# Patient Record
Sex: Female | Born: 1945 | Race: White | Hispanic: No | Marital: Single | Smoking: Never smoker
Health system: Southern US, Community
[De-identification: ages and names within clinical notes are randomized; demographics above are authoritative.]

## PROBLEM LIST (undated history)

## (undated) DIAGNOSIS — I5020 Unspecified systolic (congestive) heart failure: Secondary | ICD-10-CM

## (undated) DIAGNOSIS — I213 ST elevation (STEMI) myocardial infarction of unspecified site: Secondary | ICD-10-CM

## (undated) DIAGNOSIS — I214 Non-ST elevation (NSTEMI) myocardial infarction: Secondary | ICD-10-CM

## (undated) DIAGNOSIS — C419 Malignant neoplasm of bone and articular cartilage, unspecified: Secondary | ICD-10-CM

## (undated) DIAGNOSIS — E785 Hyperlipidemia, unspecified: Secondary | ICD-10-CM

## (undated) DIAGNOSIS — I2510 Atherosclerotic heart disease of native coronary artery without angina pectoris: Secondary | ICD-10-CM

## (undated) HISTORY — PX: CARDIAC CATHETERIZATION: SHX172

---

## 1971-02-12 HISTORY — PX: HYSTERECTOMY ABDOMINAL WITH SALPINGECTOMY: SHX6725

## 2016-02-12 HISTORY — PX: CATARACT EXTRACTION, BILATERAL: SHX1313

## 2017-09-08 DIAGNOSIS — M51369 Other intervertebral disc degeneration, lumbar region without mention of lumbar back pain or lower extremity pain: Secondary | ICD-10-CM | POA: Insufficient documentation

## 2017-09-08 DIAGNOSIS — M4316 Spondylolisthesis, lumbar region: Secondary | ICD-10-CM | POA: Insufficient documentation

## 2017-09-08 DIAGNOSIS — M5136 Other intervertebral disc degeneration, lumbar region: Secondary | ICD-10-CM | POA: Insufficient documentation

## 2018-07-31 ENCOUNTER — Emergency Department (HOSPITAL_COMMUNITY): Payer: Medicare HMO

## 2018-07-31 ENCOUNTER — Inpatient Hospital Stay (HOSPITAL_COMMUNITY)
Admission: EM | Admit: 2018-07-31 | Discharge: 2018-08-03 | DRG: 247 | Disposition: A | Discharge status: Home / Self Care | Payer: Medicare HMO | Attending: Internal Medicine | Admitting: Internal Medicine

## 2018-07-31 ENCOUNTER — Other Ambulatory Visit: Payer: Self-pay

## 2018-07-31 DIAGNOSIS — Z801 Family history of malignant neoplasm of trachea, bronchus and lung: Secondary | ICD-10-CM | POA: Diagnosis not present

## 2018-07-31 DIAGNOSIS — Z886 Allergy status to analgesic agent status: Secondary | ICD-10-CM | POA: Diagnosis not present

## 2018-07-31 DIAGNOSIS — R079 Chest pain, unspecified: Secondary | ICD-10-CM | POA: Diagnosis not present

## 2018-07-31 DIAGNOSIS — E785 Hyperlipidemia, unspecified: Secondary | ICD-10-CM

## 2018-07-31 DIAGNOSIS — I10 Essential (primary) hypertension: Secondary | ICD-10-CM | POA: Diagnosis present

## 2018-07-31 DIAGNOSIS — I340 Nonrheumatic mitral (valve) insufficiency: Secondary | ICD-10-CM | POA: Diagnosis present

## 2018-07-31 DIAGNOSIS — I2510 Atherosclerotic heart disease of native coronary artery without angina pectoris: Secondary | ICD-10-CM | POA: Diagnosis present

## 2018-07-31 DIAGNOSIS — Z8249 Family history of ischemic heart disease and other diseases of the circulatory system: Secondary | ICD-10-CM

## 2018-07-31 DIAGNOSIS — Z91018 Allergy to other foods: Secondary | ICD-10-CM

## 2018-07-31 DIAGNOSIS — I214 Non-ST elevation (NSTEMI) myocardial infarction: Secondary | ICD-10-CM | POA: Diagnosis present

## 2018-07-31 DIAGNOSIS — Z955 Presence of coronary angioplasty implant and graft: Secondary | ICD-10-CM

## 2018-07-31 DIAGNOSIS — M129 Arthropathy, unspecified: Secondary | ICD-10-CM | POA: Insufficient documentation

## 2018-07-31 HISTORY — DX: Atherosclerotic heart disease of native coronary artery without angina pectoris: I25.10

## 2018-07-31 HISTORY — DX: Hyperlipidemia, unspecified: E78.5

## 2018-07-31 LAB — CBC
HCT: 37.1 % (ref 36.0–46.0)
Hemoglobin: 11.6 g/dL — ABNORMAL LOW (ref 12.0–15.0)
MCH: 31.3 pg (ref 26.0–34.0)
MCHC: 31.3 g/dL (ref 30.0–36.0)
MCV: 100 fL (ref 80.0–100.0)
Platelets: 408 10*3/uL — ABNORMAL HIGH (ref 150–400)
RBC: 3.71 MIL/uL — ABNORMAL LOW (ref 3.87–5.11)
RDW: 13.5 % (ref 11.5–15.5)
WBC: 7.6 10*3/uL (ref 4.0–10.5)
nRBC: 0 % (ref 0.0–0.2)

## 2018-07-31 LAB — BASIC METABOLIC PANEL
Anion gap: 12 (ref 5–15)
BUN: 16 mg/dL (ref 8–23)
CO2: 21 mmol/L — ABNORMAL LOW (ref 22–32)
CREATININE: 0.58 mg/dL (ref 0.44–1.00)
Calcium: 9.2 mg/dL (ref 8.9–10.3)
Chloride: 105 mmol/L (ref 98–111)
GFR calc Af Amer: >60 mL/min (ref >60)
GFR calc non Af Amer: >60 mL/min (ref >60)
Glucose, Bld: 110 mg/dL — ABNORMAL HIGH (ref 70–99)
Potassium: 3.7 mmol/L (ref 3.5–5.1)
Sodium: 138 mmol/L (ref 135–145)

## 2018-07-31 LAB — I-STAT TROPONIN, ED: Troponin i, poc: 0.18 ng/mL (ref 0.00–0.08)

## 2018-07-31 LAB — HEPATIC FUNCTION PANEL
ALT: 19 U/L (ref 0–44)
AST: 24 U/L (ref 15–41)
Albumin: 4.1 g/dL (ref 3.5–5.0)
Alkaline Phosphatase: 54 U/L (ref 38–126)
Bilirubin, Direct: <0.1 mg/dL (ref 0.0–0.2)
Total Bilirubin: 0.6 mg/dL (ref 0.3–1.2)
Total Protein: 7.3 g/dL (ref 6.5–8.1)

## 2018-07-31 LAB — LIPID PANEL
CHOL/HDL RATIO: 5.4 ratio
Cholesterol: 257 mg/dL — ABNORMAL HIGH (ref 0–200)
HDL: 48 mg/dL (ref >40)
LDL Cholesterol: 194 mg/dL — ABNORMAL HIGH (ref 0–99)
Triglycerides: 75 mg/dL (ref <150)
VLDL: 15 mg/dL (ref 0–40)

## 2018-07-31 LAB — TSH: TSH: 1.824 u[IU]/mL (ref 0.350–4.500)

## 2018-07-31 LAB — LIPASE, BLOOD: Lipase: 25 U/L (ref 11–51)

## 2018-07-31 LAB — HEMOGLOBIN A1C
Hgb A1c MFr Bld: 5.7 % — ABNORMAL HIGH (ref 4.8–5.6)
Mean Plasma Glucose: 116.89 mg/dL

## 2018-07-31 LAB — TROPONIN I: Troponin I: 0.14 ng/mL (ref <0.03)

## 2018-07-31 MED ORDER — ACETAMINOPHEN 325 MG PO TABS
650.0000 mg | ORAL_TABLET | Freq: Four times a day (QID) | ORAL | Status: DC | PRN
  Administered 2018-08-02 – 2018-08-03 (×3): 650 mg via ORAL
  Filled 2018-07-31 (×3): qty 2

## 2018-07-31 MED ORDER — NITROGLYCERIN 0.4 MG SL SUBL: 0.4000 mg | SUBLINGUAL_TABLET | SUBLINGUAL | Status: DC | PRN

## 2018-07-31 MED ORDER — ASPIRIN EC 81 MG PO TBEC
81.0000 mg | DELAYED_RELEASE_TABLET | Freq: Every day | ORAL | Status: DC
  Administered 2018-08-01 – 2018-08-03 (×3): 81 mg via ORAL
  Filled 2018-07-31 (×3): qty 1

## 2018-07-31 MED ORDER — ONDANSETRON HCL 4 MG/2ML IJ SOLN: 4.0000 mg | Freq: Four times a day (QID) | INTRAMUSCULAR | Status: DC | PRN

## 2018-07-31 MED ORDER — HEPARIN BOLUS VIA INFUSION
4000.0000 [IU] | Freq: Once | INTRAVENOUS | Status: AC
  Administered 2018-07-31: 4000 [IU] via INTRAVENOUS
  Filled 2018-07-31: qty 4000

## 2018-07-31 MED ORDER — METOPROLOL TARTRATE 12.5 MG HALF TABLET
12.5000 mg | ORAL_TABLET | Freq: Two times a day (BID) | ORAL | Status: DC
  Administered 2018-07-31 – 2018-08-02 (×4): 12.5 mg via ORAL
  Filled 2018-07-31 (×4): qty 1

## 2018-07-31 MED ORDER — CLOPIDOGREL BISULFATE 75 MG PO TABS
600.0000 mg | ORAL_TABLET | Freq: Once | ORAL | Status: AC
  Administered 2018-07-31: 600 mg via ORAL
  Filled 2018-07-31: qty 8

## 2018-07-31 MED ORDER — HEPARIN (PORCINE) 25000 UT/250ML-% IV SOLN
1000.0000 [IU]/h | INTRAVENOUS | Status: DC
  Administered 2018-07-31: 750 [IU]/h via INTRAVENOUS
  Administered 2018-08-01: 1000 [IU]/h via INTRAVENOUS
  Filled 2018-07-31 (×2): qty 250

## 2018-07-31 MED ORDER — ATORVASTATIN CALCIUM 80 MG PO TABS
80.0000 mg | ORAL_TABLET | Freq: Every day | ORAL | Status: DC
  Administered 2018-07-31 – 2018-08-02 (×3): 80 mg via ORAL
  Filled 2018-07-31 (×3): qty 1

## 2018-07-31 MED ORDER — SODIUM CHLORIDE 0.9% FLUSH
3.0000 mL | Freq: Once | INTRAVENOUS | Status: AC
  Administered 2018-08-02: 16:00:00 3 mL via INTRAVENOUS

## 2018-07-31 MED ORDER — CLOPIDOGREL BISULFATE 75 MG PO TABS
75.0000 mg | ORAL_TABLET | Freq: Every day | ORAL | Status: DC
  Administered 2018-08-01 – 2018-08-03 (×3): 75 mg via ORAL
  Filled 2018-07-31 (×3): qty 1

## 2018-07-31 NOTE — H&P (Signed)
Cardiology Admission History and Physical:   Patient ID: Lynn Hogan MRN: [id number]; DOB: 1945/08/16   Admission date: 07/31/2018  Primary Care Provider: Si Gaul, DO Primary Cardiologist: None Primary Electrophysiologist:  None   Chief Complaint:  Chest pain   Patient Profile:   Lynn Hogan is a 73 y.o. female with no medical history who presents with chest pain and elevated troponin consistent with NSTEMI.  History of Present Illness:   Lynn Hogan is a previously healthy woman who presents with chest pain. She was in her usual state of health until yesterday afternoon when she developed a burning sensation in her chest when she was resting at home. This sensation occurred after eating so she thought that it was reflux (although she does not have a history of reflux). Today, she got to work and had a recurrence of the burning sensation in her chest. The burning sensation was located in the center of her chest and radiated up to her jaw. She felt nauseous, short of breath and sweaty. She tried to walk to help relieve the pain and slowly it subsided. Unfortunately, the pain returned so she left work and was seen by her PCP. While at her PCP office, she had another episode of pain and was sent to the ER for evaluation. While in the ER, her troponins were found to be positive. She denies any current symptoms. She has three dogs at home that she walks regularly and has never had any chest pain or shortness of breath with walking. She did not see a doctor for quite some time but recently started seeing a PCP. She was found to have high cholesterol (reports an LDL around 170) but she has been trying to avoid foods high in cholesterol and was, reportedly, able to reduce her LDL by 40 points. However, Lynn Hogan and her sisters note that she may have had high cholesterol for quite some time. She denies any current chest pain, shortness of breath, palpitations, lightheadedness, dizziness, lower  leg swelling, orthopnea, PND, fevers, chills or dysuria.     No past medical history on file.  Medications Prior to Admission: Prior to Admission medications   Medication Sig Start Date End Date Taking? Authorizing Provider  acetaminophen (TYLENOL) 500 MG tablet Take 500-1,000 mg by mouth every 6 (six) hours as needed (pain or headaches).    Yes [provider]  Glucosamine-Chondroitin (GLUCOSAMINE CHONDR COMPLEX PO) Take 1 tablet by mouth 2 (two) times daily with a meal.    Yes [provider]     Allergies:    Allergies  Allergen Reactions  . Ibuprofen Palpitations  . Nsaids Palpitations    Patient stated she has not tried all NSAIDS to see if they give her palpitations, but she is not willing to try anything else- just in case  . Banana Nausea Only    Social History:   She lives alone with her parrot and three dogs. She has several sisters that are close by. 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 She denies any history of alcohol, drug or tobacco use.   Family History:  Father- MI at age 49 Mother- Lung cancer (smoker) Sister- Atrial fibrillation   ROS:  Please see the history of present illness.  All other ROS reviewed and negative.     Physical Exam/Data:   Vitals:   07/31/18 1930 07/31/18 2000 07/31/18 2001 07/31/18 2030  BP: (!) 160/89 (!) 171/79  (!) 171/86  Pulse: 87  86 91  Resp: 18  19 14   SpO2: 96%  96% 96%  Weight:      Height:       No intake or output data in the 24 hours ending 07/31/18 2224 Last 3 Weights 07/31/2018  Weight (lbs) 161 lb  Weight (kg) 73.029 kg     Body mass index is 30.42 kg/m.  General:  Well nourished, well developed, in no acute distress HEENT: normal Lymph: no adenopathy Neck: no JVD Endocrine:  No thryomegaly Vascular: No carotid bruits; FA pulses 2+ bilaterally without bruits  Cardiac:  normal S1, S2; RRR; no murmur  Lungs:  clear to auscultation bilaterally, no wheezing,  rhonchi or rales  Abd: soft, nontender, no hepatomegaly  Ext: no edema Musculoskeletal:  No deformities, BUE and BLE strength normal and equal Skin: warm and dry  Neuro:  CNs 2-12 intact, no focal abnormalities noted Psych:  Normal affect    EKG:  The ECG that was done was personally reviewed and demonstrates sinus rhythm with nonspecific ST Segment changes (mild ST depression in some leads)  Relevant CV Studies: None  Laboratory Data:  Chemistry Recent Labs  Lab 07/31/18 1815  NA 138  K 3.7  CL 105  CO2 21*  GLUCOSE 110*  BUN 16  CREATININE 0.58  CALCIUM 9.2  GFRNONAA >60  GFRAA >60  ANIONGAP 12    Recent Labs  Lab 07/31/18 1815  PROT 7.3  ALBUMIN 4.1  AST 24  ALT 19  ALKPHOS 54  BILITOT 0.6   Hematology Recent Labs  Lab 07/31/18 1815  WBC 7.6  RBC 3.71*  HGB 11.6*  HCT 37.1  MCV 100.0  MCH 31.3  MCHC 31.3  RDW 13.5  PLT 408*   Cardiac Enzymes Recent Labs  Lab 07/31/18 1815  TROPONINI 0.14*    Recent Labs  Lab 07/31/18 1824  TROPIPOC 0.18*    BNPNo results for input(s): BNP, PROBNP in the last 168 hours.  DDimer No results for input(s): DDIMER in the last 168 hours.  Radiology/Studies:  Dg Chest 2 View  Result Date: 07/31/2018 CLINICAL DATA:  Acute chest pain today. EXAM: CHEST - 2 VIEW COMPARISON:  08/21/2017 lumbar spine FINDINGS: UPPER limits normal heart size noted. There is no evidence of focal airspace disease, pulmonary edema, suspicious pulmonary nodule/mass, pleural effusion, or pneumothorax. No acute bony abnormalities are identified. Sclerosis of a LOWER thoracic vertebral body is nonspecific (question T10). IMPRESSION: No evidence of acute cardiopulmonary disease. Nonspecific sclerosis of a LOWER thoracic vertebral body. Elective bone scan recommended for further evaluation. Electronically Signed   By: Harmon PierJeffrey  Hu M.D.   On: 07/31/2018 20:23    Assessment and Plan:   Lynn Hogan is a previously healthy 72y/o woman who presents  with an NSTEMI.  1. NSTEMI- She presents with classic symptoms of chest pain, nausea, and diaphoresis with elevated troponin consistent with an NSTEMI. Unfortunately, she has very elevated cholesterol that has probably been elevated for quite some time. She also has a family history of heart disease in her father. She would benefit from cardiac catheterization.  - Start heparin ACS nomogram - S/p aspirin 324mg   - Continue aspirin 81mg  daily - Load with plavix 600mg  then 75mg  daily - Start lipitor 80mg  - Start metoprolol 12.5mg  BID - Monitor on tele - Echo ordered - Plan for catheterization on Monday or sooner if chest pain returns - Nitro PRN for chest pain - Daily ECG   2. Hyperlipidemia- She has  been working on her diet to control her cholesterol but, unfortunately, continues to have significantly elevated LDL cholesterol. - Lipitor 80mg  as above  FEN/GI - Regular diet - DVT PPx: Heparin drip - GI PPx: Not indicated   Code status: Full code, confirmed on admission. She would want her sister (the one that is a PA) to make medical decisions for her in the event that she were unable to do so herself.   Severity of Illness: The appropriate patient status for this patient is INPATIENT. Inpatient status is judged to be reasonable and necessary in order to provide the required intensity of service to ensure the patient's safety. The patient's presenting symptoms, physical exam findings, and initial radiographic and laboratory data in the context of their chronic comorbidities is felt to place them at high risk for further clinical deterioration. Furthermore, it is not anticipated that the patient will be medically stable for discharge from the hospital within 2 midnights of admission. The following factors support the patient status of inpatient.   " The patient's presenting symptoms include chest pain, shortness of breath and nausea. " The initial radiographic and laboratory data are  worrisome because of elevated troponin. " The chronic co-morbidities include hyperlipidemia.   * I certify that at the point of admission it is my clinical judgment that the patient will require inpatient hospital care spanning beyond 2 midnights from the point of admission due to high intensity of service, high risk for further deterioration and high frequency of surveillance required.*    For questions or updates, please contact [facility] Please consult [email or web address] for contact info under     Signed, Nicoletta Ba, MD  07/31/2018 10:24 PM

## 2018-07-31 NOTE — Progress Notes (Signed)
Pt received from ED for chest paint patient currently asymptomatic. Initial assessment, vitals, CHG complete. Tele applied CCMD notified. Pt oriented to the room and call bell system. Will continue to monitor.

## 2018-07-31 NOTE — Progress Notes (Signed)
ANTICOAGULATION CONSULT NOTE - Initial Consult  Pharmacy Consult for heparin Indication: chest pain/ACS  Allergies not on file  Patient Measurements: Height: 5\' 1"  (154.9 cm) Weight: 161 lb (73 kg) IBW/kg (Calculated) : 47.8  Vital Signs: BP: 166/86 (02/01 1915) Pulse Rate: 89 (02/01 1915)  Labs: Recent Labs    07/31/18 1815  HGB 11.6*  HCT 37.1  PLT 408*  CREATININE 0.58  TROPONINI 0.14*    Estimated Creatinine Clearance: 57.2 mL/min (by C-G formula based on SCr of 0.58 mg/dL).  Assessment: CC/HPI: 73 yo f presenting with chest burning  Anticoag: none pta iv hep for r/o acs  Renal: SCr 0.58  Heme/Onc: H&H 11.6/37.1, Plt 408  Goal of Therapy:  Heparin level 0.3-0.7 units/ml Monitor platelets by anticoagulation protocol: Yes   Plan:  Heparin bolus 4000 units x 1 Heparin gtt 750 units/hr Daily HL CBC  Isaac Bliss, PharmD, BCPS, BCCCP Clinical Pharmacist [phone]  Please check AMION for all [facility] numbers  07/31/2018 7:59 PM

## 2018-07-31 NOTE — ED Triage Notes (Signed)
Pt arrived via [facility] from work where she experienced an episode of her chest burning. Episode quickly resolved but was seen at [facility] anyway. Pain returned while at [facility]. EMS notified for transport to ED. Pt is currently pain free. No hx. Pt is ambulatory and alert/oriented x4. 324mg  ASA and 1 SL nitro given by EMS PTA.

## 2018-07-31 NOTE — ED Provider Notes (Signed)
Emergency Department Provider Note   I have reviewed the triage vital signs and the nursing notes.   HISTORY  Chief Complaint Chest Pain   HPI Lynn Hogan is a 73 y.o. female without significant past medical history the presents the emergency department today with multiple episodes of epigastric burning that radiated to both sides of her neck into her ears.  They came and went.  One time it was associated with nausea and another time was associated with diaphoresis both went away without intervention.  Went to urgent care reviewed EKG it was normal and called EMS and sent her here.  EMS EKGs were normal.  She was given 324 of aspirin prior to arrival here.  She was given 1 dose of nitroglycerin but the pain improved prior to the nitroglycerin.  She is a family history of coronary disease and a father who died at a young age of a massive heart attack.  She is had some borderline blood pressures and cholesterol in the past but not on treatment for either 1.  She has some recent weight loss but still mildly obese. No other associated or modifying symptoms.    No past medical history on file.  Patient Active Problem List   Diagnosis Date Noted  . NSTEMI (non-ST elevated myocardial infarction) (HCC) 07/31/2018  . Hyperlipidemia 07/31/2018    Allergies Ibuprofen; Nsaids; and Banana  No family history on file.  Social History Social History   Tobacco Use  . Smoking status: Not on file  Substance Use Topics  . Alcohol use: Not on file  . Drug use: Not on file    Review of Systems  All other systems negative except as documented in the HPI. All pertinent positives and negatives as reviewed in the HPI. ____________________________________________   PHYSICAL EXAM:  VITAL SIGNS: ED Triage Vitals  Enc Vitals Group     BP 07/31/18 1830 (!) 154/89     Pulse Rate 07/31/18 1830 88     Resp 07/31/18 1830 20     Temp 07/31/18 2300 98.9 F (37.2 C)     Temp Source 07/31/18  2300 Oral     SpO2 07/31/18 1830 95 %     Weight 07/31/18 1917 161 lb (73 kg)     Height 07/31/18 1917 5\' 1"  (1.549 m)    Constitutional: Alert and oriented. Well appearing and in no acute distress. Eyes: Conjunctivae are normal. PERRL. EOMI. Head: Atraumatic. Nose: No congestion/rhinnorhea. Mouth/Throat: Mucous membranes are moist.  Oropharynx non-erythematous. Neck: No stridor.  No meningeal signs.   Cardiovascular: Normal rate, regular rhythm. Good peripheral circulation. Grossly normal heart sounds.   Respiratory: Normal respiratory effort.  No retractions. Lungs CTAB. Gastrointestinal: Soft and nontender. No distention.  Musculoskeletal: No lower extremity tenderness nor edema. No gross deformities of extremities. Neurologic:  Normal speech and language. No gross focal neurologic deficits are appreciated.  Skin:  Skin is warm, dry and intact. No rash noted.   ____________________________________________   LABS (all labs ordered are listed, but only abnormal results are displayed)  Labs Reviewed  BASIC METABOLIC PANEL - Abnormal; Notable for the following components:      Result Value   CO2 21 (*)    Glucose, Bld 110 (*)    All other components within normal limits  CBC - Abnormal; Notable for the following components:   RBC 3.71 (*)    Hemoglobin 11.6 (*)    Platelets 408 (*)    All other components within normal  limits  TROPONIN I - Abnormal; Notable for the following components:   Troponin I 0.14 (*)    All other components within normal limits  LIPID PANEL - Abnormal; Notable for the following components:   Cholesterol 257 (*)    LDL Cholesterol 194 (*)    All other components within normal limits  HEMOGLOBIN A1C - Abnormal; Notable for the following components:   Hgb A1c MFr Bld 5.7 (*)    All other components within normal limits  I-STAT TROPONIN, ED - Abnormal; Notable for the following components:   Troponin i, poc 0.18 (*)    All other components within  normal limits  HEPATIC FUNCTION PANEL  LIPASE, BLOOD  TSH  HEPARIN LEVEL (UNFRACTIONATED)  CBC  TROPONIN I  TROPONIN I  TROPONIN I  BASIC METABOLIC PANEL   ____________________________________________  EKG   EKG Interpretation  Date/Time:  Saturday July 31 2018 18:11:16 EST Ventricular Rate:  88 PR Interval:    QRS Duration: 90 QT Interval:  386 QTC Calculation: 467 R Axis:   -28 Text Interpretation:  Sinus rhythm Probable left atrial enlargement Borderline left axis deviation No old tracing to compare Confirmed by Marily MemosMesner, Hyun Marsalis [phone]) on 07/31/2018 6:26:05 PM       ____________________________________________  RADIOLOGY  Dg Chest 2 View  Result Date: 07/31/2018 CLINICAL DATA:  Acute chest pain today. EXAM: CHEST - 2 VIEW COMPARISON:  08/21/2017 lumbar spine FINDINGS: UPPER limits normal heart size noted. There is no evidence of focal airspace disease, pulmonary edema, suspicious pulmonary nodule/mass, pleural effusion, or pneumothorax. No acute bony abnormalities are identified. Sclerosis of a LOWER thoracic vertebral body is nonspecific (question T10). IMPRESSION: No evidence of acute cardiopulmonary disease. Nonspecific sclerosis of a LOWER thoracic vertebral body. Elective bone scan recommended for further evaluation. Electronically Signed   By: Harmon PierJeffrey  Hu M.D.   On: 07/31/2018 20:23    ____________________________________________   PROCEDURES  Procedure(s) performed:   Procedures  CRITICAL CARE Performed by: Marily MemosJason Alisabeth Selkirk Total critical care time: 35 minutes Critical care time was exclusive of separately billable procedures and treating other patients. Critical care was necessary to treat or prevent imminent or life-threatening deterioration. Critical care was time spent personally by me on the following activities: development of treatment plan with patient and/or surrogate as well as nursing, discussions with consultants, evaluation of patient's response  to treatment, examination of patient, obtaining history from patient or surrogate, ordering and performing treatments and interventions, ordering and review of laboratory studies, ordering and review of radiographic studies, pulse oximetry and re-evaluation of patient's condition.  ____________________________________________   INITIAL IMPRESSION / ASSESSMENT AND PLAN / ED COURSE  Very atypical story but has undetectable troponin.  Discussed with cardiology who will admit.  Heparin started in the emergency room.     Pertinent labs & imaging results that were available during my care of the patient were reviewed by me and considered in my medical decision making (see chart for details).  ____________________________________________  FINAL CLINICAL IMPRESSION(S) / ED DIAGNOSES  Final diagnoses:  NSTEMI (non-ST elevated myocardial infarction) (HCC)     MEDICATIONS GIVEN DURING THIS VISIT:  Medications  sodium chloride flush (NS) 0.9 % injection 3 mL (has no administration in time range)  heparin ADULT infusion 100 units/mL (25000 units/25950mL sodium chloride 0.45%) (750 Units/hr Intravenous New Bag/Given 07/31/18 2014)  acetaminophen (TYLENOL) tablet 650 mg (has no administration in time range)  aspirin EC tablet 81 mg (has no administration in time range)  nitroGLYCERIN (NITROSTAT) SL  tablet 0.4 mg (has no administration in time range)  ondansetron (ZOFRAN) injection 4 mg (has no administration in time range)  clopidogrel (PLAVIX) tablet 600 mg (has no administration in time range)  clopidogrel (PLAVIX) tablet 75 mg (has no administration in time range)  metoprolol tartrate (LOPRESSOR) tablet 12.5 mg (has no administration in time range)  atorvastatin (LIPITOR) tablet 80 mg (has no administration in time range)  heparin bolus via infusion 4,000 Units (4,000 Units Intravenous Bolus from Bag 07/31/18 2014)     NEW OUTPATIENT MEDICATIONS STARTED DURING THIS VISIT:  Current Discharge  Medication List      Note:  This note was prepared with assistance of Dragon voice recognition software. Occasional wrong-word or sound-a-like substitutions may have occurred due to the inherent limitations of voice recognition software.   Marily Memos, MD 07/31/18 2328

## 2018-07-31 NOTE — ED Notes (Signed)
Pt ambulated to the bathroom.  

## 2018-08-01 ENCOUNTER — Encounter (HOSPITAL_COMMUNITY): Payer: Self-pay

## 2018-08-01 ENCOUNTER — Inpatient Hospital Stay (HOSPITAL_COMMUNITY): Payer: Medicare HMO

## 2018-08-01 ENCOUNTER — Other Ambulatory Visit (HOSPITAL_COMMUNITY): Payer: Medicare HMO

## 2018-08-01 DIAGNOSIS — I214 Non-ST elevation (NSTEMI) myocardial infarction: Principal | ICD-10-CM

## 2018-08-01 DIAGNOSIS — R079 Chest pain, unspecified: Secondary | ICD-10-CM

## 2018-08-01 LAB — BASIC METABOLIC PANEL
ANION GAP: 13 (ref 5–15)
BUN: 10 mg/dL (ref 8–23)
CO2: 21 mmol/L — ABNORMAL LOW (ref 22–32)
Calcium: 9.5 mg/dL (ref 8.9–10.3)
Chloride: 106 mmol/L (ref 98–111)
Creatinine, Ser: 0.55 mg/dL (ref 0.44–1.00)
GFR calc Af Amer: >60 mL/min (ref >60)
GFR calc non Af Amer: >60 mL/min (ref >60)
Glucose, Bld: 108 mg/dL — ABNORMAL HIGH (ref 70–99)
POTASSIUM: 3.6 mmol/L (ref 3.5–5.1)
Sodium: 140 mmol/L (ref 135–145)

## 2018-08-01 LAB — CBC
HCT: 36.3 % (ref 36.0–46.0)
Hemoglobin: 11.9 g/dL — ABNORMAL LOW (ref 12.0–15.0)
MCH: 32.1 pg (ref 26.0–34.0)
MCHC: 32.8 g/dL (ref 30.0–36.0)
MCV: 97.8 fL (ref 80.0–100.0)
Platelets: 415 10*3/uL — ABNORMAL HIGH (ref 150–400)
RBC: 3.71 MIL/uL — ABNORMAL LOW (ref 3.87–5.11)
RDW: 13.7 % (ref 11.5–15.5)
WBC: 8.1 10*3/uL (ref 4.0–10.5)
nRBC: 0 % (ref 0.0–0.2)

## 2018-08-01 LAB — TROPONIN I
Troponin I: 0.51 ng/mL (ref <0.03)
Troponin I: 0.67 ng/mL (ref <0.03)
Troponin I: 0.66 ng/mL (ref <0.03)

## 2018-08-01 LAB — MRSA PCR SCREENING: MRSA by PCR: NEGATIVE

## 2018-08-01 LAB — HEPARIN LEVEL (UNFRACTIONATED)
Heparin Unfractionated: 0.22 IU/mL — ABNORMAL LOW (ref 0.30–0.70)
Heparin Unfractionated: 0.31 IU/mL (ref 0.30–0.70)

## 2018-08-01 MED ORDER — SODIUM CHLORIDE 0.9 % WEIGHT BASED INFUSION: 1.0000 mL/kg/h | INTRAVENOUS | Status: DC

## 2018-08-01 MED ORDER — SODIUM CHLORIDE 0.9% FLUSH
3.0000 mL | Freq: Two times a day (BID) | INTRAVENOUS | Status: DC
  Administered 2018-08-01: 3 mL via INTRAVENOUS

## 2018-08-01 MED ORDER — SODIUM CHLORIDE 0.9 % WEIGHT BASED INFUSION
3.0000 mL/kg/h | INTRAVENOUS | Status: DC
  Administered 2018-08-02: 3 mL/kg/h via INTRAVENOUS

## 2018-08-01 MED ORDER — SODIUM CHLORIDE 0.9 % IV SOLN: 250.0000 mL | INTRAVENOUS | Status: DC | PRN

## 2018-08-01 MED ORDER — SODIUM CHLORIDE 0.9% FLUSH: 3.0000 mL | INTRAVENOUS | Status: DC | PRN

## 2018-08-01 NOTE — Progress Notes (Signed)
Progress Note  Patient Name: Lynn MoritaLinda Hogan Date of Encounter: 08/01/2018  Primary Cardiologist: No primary care provider on file.   Subjective   Currently feeling well without complaint.  No further episodes of chest pain.  Inpatient Medications    Scheduled Meds: . aspirin EC  81 mg Oral Daily  . atorvastatin  80 mg Oral q1800  . clopidogrel  75 mg Oral Q breakfast  . metoprolol tartrate  12.5 mg Oral BID  . sodium chloride flush  3 mL Intravenous Once   Continuous Infusions: . heparin 900 Units/hr (08/01/18 0617)   PRN Meds: acetaminophen, nitroGLYCERIN, ondansetron (ZOFRAN) IV   Vital Signs    Vitals:   07/31/18 2030 07/31/18 2230 07/31/18 2300 08/01/18 0837  BP: (!) 171/86 (!) 158/80 (!) 168/75 (!) 162/73  Pulse: 91 86 86 76  Resp: 14 12 12 18   Temp:   98.9 F (37.2 C) 98.4 F (36.9 C)  TempSrc:   Oral Oral  SpO2: 96% 94% 97% 98%  Weight:   73.6 kg   Height:   5\' 1"  (1.549 m)     Intake/Output Summary (Last 24 hours) at 08/01/2018 1219 Last data filed at 08/01/2018 0427 Gross per 24 hour  Intake 101.19 ml  Output -  Net 101.19 ml   Last 3 Weights 07/31/2018 07/31/2018  Weight (lbs) 162 lb 3.2 oz 161 lb  Weight (kg) 73.573 kg 73.029 kg      Telemetry    Sinus rhythm- Personally Reviewed  ECG    Sinus rhythm- Personally Reviewed  Physical Exam   GEN: No acute distress.   Neck: No JVD Cardiac: RRR, no murmurs, rubs, or gallops.  Respiratory: Clear to auscultation bilaterally. GI: Soft, nontender, non-distended  MS: No edema; No deformity. Neuro:  Nonfocal  Psych: Normal affect   Labs    Chemistry Recent Labs  Lab 07/31/18 1815 08/01/18 0427  NA 138 140  K 3.7 3.6  CL 105 106  CO2 21* 21*  GLUCOSE 110* 108*  BUN 16 10  CREATININE 0.58 0.55  CALCIUM 9.2 9.5  PROT 7.3  --   ALBUMIN 4.1  --   AST 24  --   ALT 19  --   ALKPHOS 54  --   BILITOT 0.6  --   GFRNONAA >60 >60  GFRAA >60 >60  ANIONGAP 12 13     Hematology Recent Labs    Lab 07/31/18 1815 08/01/18 0427  WBC 7.6 8.1  RBC 3.71* 3.71*  HGB 11.6* 11.9*  HCT 37.1 36.3  MCV 100.0 97.8  MCH 31.3 32.1  MCHC 31.3 32.8  RDW 13.5 13.7  PLT 408* 415*    Cardiac Enzymes Recent Labs  Lab 07/31/18 1815 07/31/18 2305 08/01/18 0427 08/01/18 1044  TROPONINI 0.14* 0.51* 0.67* 0.66*    Recent Labs  Lab 07/31/18 1824  TROPIPOC 0.18*     BNPNo results for input(s): BNP, PROBNP in the last 168 hours.   DDimer No results for input(s): DDIMER in the last 168 hours.   Radiology    Dg Chest 2 View  Result Date: 07/31/2018 CLINICAL DATA:  Acute chest pain today. EXAM: CHEST - 2 VIEW COMPARISON:  08/21/2017 lumbar spine FINDINGS: UPPER limits normal heart size noted. There is no evidence of focal airspace disease, pulmonary edema, suspicious pulmonary nodule/mass, pleural effusion, or pneumothorax. No acute bony abnormalities are identified. Sclerosis of a LOWER thoracic vertebral body is nonspecific (question T10). IMPRESSION: No evidence of acute cardiopulmonary disease. Nonspecific sclerosis  of a LOWER thoracic vertebral body. Elective bone scan recommended for further evaluation. Electronically Signed   By: Harmon Pier M.D.   On: 07/31/2018 20:23    Cardiac Studies   Echo pending  Patient Profile     73 y.o. female who presented to the hospital with episodic chest pain, found to have an elevated troponin and non-STEMI.  Assessment & Plan    1.  Non-STEMI: Currently on heparin, aspirin, Plavix, Lipitor, and low-dose metoprolol.  She is currently chest pain-free.  Troponin has trended down.  We [name] thus plan for left heart catheterization tomorrow.  The patient understands that risks include but are not limited to stroke (1 in 1000), death (1 in 1000), kidney failure [usually temporary] (1 in 500), bleeding (1 in 200), allergic reaction [possibly serious] (1 in 200), and agrees to proceed.  2.  Hyperlipidemia: Has been placed on Lipitor 80 mg.  VL quite  elevated at 194.      For questions or updates, please contact [facility] Please consult [email or web address] for contact info under        Signed, Maitlyn Penza Jorja Loa, MD  08/01/2018, 12:19 PM

## 2018-08-01 NOTE — Progress Notes (Signed)
  Echocardiogram 2D Echocardiogram has been performed.  Lynn Hogan F 08/01/2018, 3:19 PM

## 2018-08-01 NOTE — Progress Notes (Signed)
ANTICOAGULATION CONSULT NOTE   Pharmacy Consult for Heparin  Indication: chest pain/ACS  Allergies  Allergen Reactions  . Ibuprofen Palpitations  . Nsaids Palpitations    Patient stated she has not tried all NSAIDS to see if they give her palpitations, but she is not willing to try anything else- just in case  . Banana Nausea Only    Patient Measurements: Height: 5\' 1"  (154.9 cm) Weight: 162 lb 3.2 oz (73.6 kg) IBW/kg (Calculated) : 47.8  Vital Signs: Temp: 98.2 F (36.8 C) (02/02 1230) Temp Source: Oral (02/02 1230) BP: 145/87 (02/02 1230) Pulse Rate: 74 (02/02 1230)  Labs: Recent Labs    07/31/18 1815 07/31/18 2305 08/01/18 0427 08/01/18 1044 08/01/18 1351  HGB 11.6*  --  11.9*  --   --   HCT 37.1  --  36.3  --   --   PLT 408*  --  415*  --   --   HEPARINUNFRC  --   --  0.22*  --  0.31  CREATININE 0.58  --  0.55  --   --   TROPONINI 0.14* 0.51* 0.67* 0.66*  --     Estimated Creatinine Clearance: 57.4 mL/min (by C-G formula based on SCr of 0.55 mg/dL).   Medical History: No past medical history on file.   Assessment: Heparin for NSTEMI, trop 0.51>0.67>0.66. No anticoag PTA.  Confirmatory heparin level came back therapeutic at 0.31, on 900 units/hr. Hgb 11.9, plt 415. No s/sx of bleeding. No infusion issues.  Goal of Therapy:  Heparin level 0.3-0.7 units/ml Monitor platelets by anticoagulation protocol: Yes   Plan:  Increase heparin to 1000 units/hr to keep in goal range Monitor daily HL, CBC, and for s/sx of bleeding  Sherron Monday, PharmD, BCCCP Clinical Pharmacist  Pager: [phone] Phone: [phone] 08/01/2018,2:52 PM

## 2018-08-01 NOTE — Progress Notes (Addendum)
CRITICAL VALUE ALERT  Critical Value:  Troponin 0.51 now 0.67  Date & Time Notied:  08/01/2018 0550 when reviewing morning labs   Provider Notified: Conigilio   Orders Received/Actions taken: Awaiting orders. Pt asymptomatic. Will continue to monitor.

## 2018-08-01 NOTE — Progress Notes (Signed)
ANTICOAGULATION CONSULT NOTE   Pharmacy Consult for Heparin  Indication: chest pain/ACS  Allergies  Allergen Reactions  . Ibuprofen Palpitations  . Nsaids Palpitations    Patient stated she has not tried all NSAIDS to see if they give her palpitations, but she is not willing to try anything else- just in case  . Banana Nausea Only    Patient Measurements: Height: 5\' 1"  (154.9 cm) Weight: 162 lb 3.2 oz (73.6 kg) IBW/kg (Calculated) : 47.8  Vital Signs: Temp: 98.9 F (37.2 C) (02/01 2300) Temp Source: Oral (02/01 2300) BP: 168/75 (02/01 2300) Pulse Rate: 86 (02/01 2300)  Labs: Recent Labs    07/31/18 1815 07/31/18 2305 08/01/18 0427  HGB 11.6*  --  11.9*  HCT 37.1  --  36.3  PLT 408*  --  415*  HEPARINUNFRC  --   --  0.22*  CREATININE 0.58  --  0.55  TROPONINI 0.14* 0.51* 0.67*    Estimated Creatinine Clearance: 57.4 mL/min (by C-G formula based on SCr of 0.55 mg/dL).   Medical History: No past medical history on file.   Assessment: Heparin for NSTEMI, trop 0.51>0.67, CBC stable, initial heparin level is low at 0.22  Goal of Therapy:  Heparin level 0.3-0.7 units/ml Monitor platelets by anticoagulation protocol: Yes   Plan:  Inc heparin to 900 units/hr Re-check heparin level in 8 hours  Abran Duke 08/01/2018,6:13 AM

## 2018-08-02 ENCOUNTER — Encounter (HOSPITAL_COMMUNITY)
Admission: EM | Disposition: A | Discharge status: Home / Self Care | Payer: Self-pay | Source: Home / Self Care | Attending: Internal Medicine

## 2018-08-02 ENCOUNTER — Encounter (HOSPITAL_COMMUNITY): Payer: Self-pay | Admitting: Cardiology

## 2018-08-02 DIAGNOSIS — I2510 Atherosclerotic heart disease of native coronary artery without angina pectoris: Secondary | ICD-10-CM

## 2018-08-02 HISTORY — PX: CORONARY STENT INTERVENTION: CATH118234

## 2018-08-02 HISTORY — PX: LEFT HEART CATH AND CORONARY ANGIOGRAPHY: CATH118249

## 2018-08-02 LAB — CBC
HCT: 35.5 % — ABNORMAL LOW (ref 36.0–46.0)
Hemoglobin: 11.6 g/dL — ABNORMAL LOW (ref 12.0–15.0)
MCH: 32.3 pg (ref 26.0–34.0)
MCHC: 32.7 g/dL (ref 30.0–36.0)
MCV: 98.9 fL (ref 80.0–100.0)
Platelets: 379 10*3/uL (ref 150–400)
RBC: 3.59 MIL/uL — ABNORMAL LOW (ref 3.87–5.11)
RDW: 13.9 % (ref 11.5–15.5)
WBC: 7.3 10*3/uL (ref 4.0–10.5)
nRBC: 0 % (ref 0.0–0.2)

## 2018-08-02 LAB — POCT ACTIVATED CLOTTING TIME
Activated Clotting Time: 252 seconds
Activated Clotting Time: 252 seconds

## 2018-08-02 LAB — ECHOCARDIOGRAM COMPLETE
Height: 61 in
WEIGHTICAEL: 2595.2 [oz_av]

## 2018-08-02 LAB — HEPARIN LEVEL (UNFRACTIONATED): HEPARIN UNFRACTIONATED: 0.36 [IU]/mL (ref 0.30–0.70)

## 2018-08-02 SURGERY — LEFT HEART CATH AND CORONARY ANGIOGRAPHY
Anesthesia: LOCAL

## 2018-08-02 MED ORDER — LIDOCAINE HCL (PF) 1 % IJ SOLN
INTRAMUSCULAR | Status: DC | PRN
  Administered 2018-08-02: 1 mL

## 2018-08-02 MED ORDER — VERAPAMIL HCL 2.5 MG/ML IV SOLN
INTRAVENOUS | Status: DC | PRN
  Administered 2018-08-02: 10 mL via INTRA_ARTERIAL

## 2018-08-02 MED ORDER — SODIUM CHLORIDE 0.9% FLUSH: 3.0000 mL | INTRAVENOUS | Status: DC | PRN

## 2018-08-02 MED ORDER — ANGIOPLASTY BOOK
Freq: Once | Status: AC
  Administered 2018-08-02
  Filled 2018-08-02: qty 1

## 2018-08-02 MED ORDER — HEPARIN SODIUM (PORCINE) 1000 UNIT/ML IJ SOLN
INTRAMUSCULAR | Status: AC
  Filled 2018-08-02: qty 1

## 2018-08-02 MED ORDER — ENOXAPARIN SODIUM 40 MG/0.4ML ~~LOC~~ SOLN
40.0000 mg | SUBCUTANEOUS | Status: DC
  Administered 2018-08-03: 10:00:00 40 mg via SUBCUTANEOUS
  Filled 2018-08-02: qty 0.4

## 2018-08-02 MED ORDER — LABETALOL HCL 5 MG/ML IV SOLN: 10.0000 mg | INTRAVENOUS | Status: AC | PRN

## 2018-08-02 MED ORDER — FENTANYL CITRATE (PF) 100 MCG/2ML IJ SOLN
INTRAMUSCULAR | Status: AC
  Filled 2018-08-02: qty 2

## 2018-08-02 MED ORDER — HEPARIN (PORCINE) IN NACL 1000-0.9 UT/500ML-% IV SOLN
INTRAVENOUS | Status: DC | PRN
  Administered 2018-08-02 (×2): 500 mL

## 2018-08-02 MED ORDER — SODIUM CHLORIDE 0.9 % IV SOLN: 250.0000 mL | INTRAVENOUS | Status: DC | PRN

## 2018-08-02 MED ORDER — VERAPAMIL HCL 2.5 MG/ML IV SOLN
INTRAVENOUS | Status: AC
  Filled 2018-08-02: qty 2

## 2018-08-02 MED ORDER — SODIUM CHLORIDE 0.9% FLUSH: 3.0000 mL | Freq: Two times a day (BID) | INTRAVENOUS | Status: DC

## 2018-08-02 MED ORDER — FENTANYL CITRATE (PF) 100 MCG/2ML IJ SOLN
INTRAMUSCULAR | Status: DC | PRN
  Administered 2018-08-02 (×2): 25 ug via INTRAVENOUS

## 2018-08-02 MED ORDER — HYDRALAZINE HCL 20 MG/ML IJ SOLN: 5.0000 mg | INTRAMUSCULAR | Status: AC | PRN

## 2018-08-02 MED ORDER — SODIUM CHLORIDE 0.9 % IV SOLN
INTRAVENOUS | Status: AC
  Administered 2018-08-02: 15:00:00 via INTRAVENOUS

## 2018-08-02 MED ORDER — IOHEXOL 350 MG/ML SOLN
INTRAVENOUS | Status: DC | PRN
  Administered 2018-08-02: 155 mL via INTRA_ARTERIAL

## 2018-08-02 MED ORDER — LIDOCAINE HCL (PF) 1 % IJ SOLN
INTRAMUSCULAR | Status: AC
  Filled 2018-08-02: qty 30

## 2018-08-02 MED ORDER — MIDAZOLAM HCL 2 MG/2ML IJ SOLN
INTRAMUSCULAR | Status: AC
  Filled 2018-08-02: qty 2

## 2018-08-02 MED ORDER — HEPARIN SODIUM (PORCINE) 1000 UNIT/ML IJ SOLN
INTRAMUSCULAR | Status: DC | PRN
  Administered 2018-08-02: 4000 [IU] via INTRAVENOUS
  Administered 2018-08-02 (×2): 2000 [IU] via INTRAVENOUS
  Administered 2018-08-02: 4000 [IU] via INTRAVENOUS

## 2018-08-02 MED ORDER — NITROGLYCERIN 1 MG/10 ML FOR IR/CATH LAB
INTRA_ARTERIAL | Status: AC
  Filled 2018-08-02: qty 10

## 2018-08-02 MED ORDER — CLOPIDOGREL BISULFATE 300 MG PO TABS
ORAL_TABLET | ORAL | Status: DC | PRN
  Administered 2018-08-02: 300 mg via ORAL

## 2018-08-02 MED ORDER — HEART ATTACK BOUNCING BOOK
Freq: Once | Status: AC
  Administered 2018-08-02
  Filled 2018-08-02: qty 1

## 2018-08-02 MED ORDER — HEPARIN (PORCINE) IN NACL 1000-0.9 UT/500ML-% IV SOLN
INTRAVENOUS | Status: AC
  Filled 2018-08-02: qty 1000

## 2018-08-02 MED ORDER — MIDAZOLAM HCL 2 MG/2ML IJ SOLN
INTRAMUSCULAR | Status: DC | PRN
  Administered 2018-08-02 (×2): 1 mg via INTRAVENOUS

## 2018-08-02 MED ORDER — METOPROLOL TARTRATE 25 MG PO TABS
25.0000 mg | ORAL_TABLET | Freq: Two times a day (BID) | ORAL | Status: DC
  Administered 2018-08-02 – 2018-08-03 (×2): 25 mg via ORAL
  Filled 2018-08-02 (×2): qty 1

## 2018-08-02 MED ORDER — CLOPIDOGREL BISULFATE 300 MG PO TABS
ORAL_TABLET | ORAL | Status: AC
  Filled 2018-08-02: qty 1

## 2018-08-02 MED ORDER — NITROGLYCERIN 1 MG/10 ML FOR IR/CATH LAB
INTRA_ARTERIAL | Status: DC | PRN
  Administered 2018-08-02 (×2): 200 ug via INTRACORONARY

## 2018-08-02 SURGICAL SUPPLY — 17 items
BALLN SAPPHIRE 2.5X15 (BALLOONS) ×2
BALLN SAPPHIRE ~~LOC~~ 3.25X15 (BALLOONS) ×2 IMPLANT
BALLOON SAPPHIRE 2.5X15 (BALLOONS) ×1 IMPLANT
CATH 5FR JL3.5 JR4 ANG PIG MP (CATHETERS) ×2 IMPLANT
CATH LAUNCHER 6FR JR4 (CATHETERS) ×2 IMPLANT
DEVICE RAD COMP TR BAND LRG (VASCULAR PRODUCTS) ×2 IMPLANT
GLIDESHEATH SLEND SS 6F .021 (SHEATH) ×2 IMPLANT
GUIDEWIRE INQWIRE 1.5J.035X260 (WIRE) ×1 IMPLANT
INQWIRE 1.5J .035X260CM (WIRE) ×2
KIT ENCORE 26 ADVANTAGE (KITS) ×2 IMPLANT
KIT HEART LEFT (KITS) ×2 IMPLANT
PACK CARDIAC CATHETERIZATION (CUSTOM PROCEDURE TRAY) ×2 IMPLANT
STENT SYNERGY DES 3X38 (Permanent Stent) ×2 IMPLANT
SYR MEDRAD MARK 7 150ML (SYRINGE) ×2 IMPLANT
TRANSDUCER W/STOPCOCK (MISCELLANEOUS) ×2 IMPLANT
TUBING CIL FLEX 10 FLL-RA (TUBING) ×2 IMPLANT
WIRE RUNTHROUGH .014X180CM (WIRE) ×2 IMPLANT

## 2018-08-02 NOTE — Progress Notes (Signed)
TR BAND REMOVAL  LOCATION:    right radial  DEFLATED PER PROTOCOL:    Yes.    TIME BAND OFF / DRESSING APPLIED:    1815   SITE UPON ARRIVAL:    Level 0  SITE AFTER BAND REMOVAL:    Level 0  CIRCULATION SENSATION AND MOVEMENT:    Within Normal Limits   Yes.    COMMENTS:   TOLERATED PROCEDURE WELL

## 2018-08-02 NOTE — Progress Notes (Signed)
ANTICOAGULATION CONSULT NOTE   Pharmacy Consult for Heparin  Indication: chest pain/ACS  Patient Measurements: Height: 5\' 1"  (154.9 cm) Weight: 162 lb 3.2 oz (73.6 kg) IBW/kg (Calculated) : 47.8  Vital Signs: Temp: 98.1 F (36.7 C) (02/03 0402) Temp Source: Oral (02/03 0402) BP: 161/74 (02/03 0402) Pulse Rate: 66 (02/03 0402)  Labs: Recent Labs    07/31/18 1815 07/31/18 2305 08/01/18 0427 08/01/18 1044 08/01/18 1351 08/02/18 0215  HGB 11.6*  --  11.9*  --   --  11.6*  HCT 37.1  --  36.3  --   --  35.5*  PLT 408*  --  415*  --   --  379  HEPARINUNFRC  --   --  0.22*  --  0.31 0.36  CREATININE 0.58  --  0.55  --   --   --   TROPONINI 0.14* 0.51* 0.67* 0.66*  --   --      Assessment: Heparin for NSTEMI, trop 0.51>0.67>0.66. No anticoag PTA.  Heparin level remains therapeutic this morning. H/h plts wnl.   Goal of Therapy:  Heparin level 0.3-0.7 units/ml Monitor platelets by anticoagulation protocol: Yes    Plan:  Continue heparin at 1000 units/hr Monitor daily HL, CBC, and for s/sx of bleeding   Baldemar Friday 08/02/2018 8:07 AM

## 2018-08-02 NOTE — Interval H&P Note (Signed)
History and Physical Interval Note:  08/02/2018 12:18 PM  Lynn Hogan  has presented today for cardiac catheterization, with the diagnosis of NSTEMI  The various methods of treatment have been discussed with the patient and family. After consideration of risks, benefits and other options for treatment, the patient has consented to  Procedure(s): LEFT HEART CATH AND CORONARY ANGIOGRAPHY (N/A) as a surgical intervention .  The patient's history has been reviewed, patient examined, no change in status, stable for surgery.  I have reviewed the patient's chart and labs.  Questions were answered to the patient's satisfaction.    Cath Lab Visit (complete for each Cath Lab visit)  Clinical Evaluation Leading to the Procedure:   ACS: Yes.    Non-ACS:  N/A  Asaf Elmquist

## 2018-08-02 NOTE — Progress Notes (Addendum)
Progress Note  Patient Name: Lynn Hogan Date of Encounter: 08/02/2018  Primary Cardiologist: New to [facility]  Subjective   Pt denies recurrent chest pain this AM. Continues on Hep gtt in anticipation of cath today   Inpatient Medications    Scheduled Meds: . aspirin EC  81 mg Oral Daily  . atorvastatin  80 mg Oral q1800  . clopidogrel  75 mg Oral Q breakfast  . metoprolol tartrate  12.5 mg Oral BID  . sodium chloride flush  3 mL Intravenous Once  . sodium chloride flush  3 mL Intravenous Q12H   Continuous Infusions: . sodium chloride    . sodium chloride 1 mL/kg/hr (08/02/18 0511)  . heparin 1,000 Units/hr (08/01/18 1735)   PRN Meds: sodium chloride, acetaminophen, nitroGLYCERIN, ondansetron (ZOFRAN) IV, sodium chloride flush   Vital Signs    Vitals:   08/01/18 1230 08/01/18 1952 08/02/18 0402 08/02/18 0918  BP: (!) 145/87 (!) 154/85 (!) 161/74 (!) 154/73  Pulse: 74 76 66 73  Resp: 17 14 15    Temp: 98.2 F (36.8 C) 98 F (36.7 C) 98.1 F (36.7 C) 98.3 F (36.8 C)  TempSrc: Oral Oral Oral Oral  SpO2: 95% 94% 95% 96%  Weight:      Height:        Intake/Output Summary (Last 24 hours) at 08/02/2018 0927 Last data filed at 08/02/2018 0409 Gross per 24 hour  Intake 1072.75 ml  Output -  Net 1072.75 ml   Filed Weights   07/31/18 1917 07/31/18 2300  Weight: 73 kg 73.6 kg   Physical Exam   General: Well developed, well nourished, NAD Skin: Warm, dry, intact  Head: Normocephalic, atraumatic, clear, moist mucus membranes. Neck: Negative for carotid bruits. No JVD Lungs:Clear to ausculation bilaterally. No wheezes, rales, or rhonchi. Breathing is unlabored. Cardiovascular: RRR with S1 S2. No murmurs, rubs, gallops, or LV heave appreciated. Abdomen: Soft, non-tender, non-distended with normoactive bowel sounds. No obvious abdominal masses. MSK: Strength and tone appear normal for age. 5/5 in all extremities Extremities: No edema. No clubbing or cyanosis. DP/PT  pulses 2+ bilaterally Neuro: Alert and oriented. No focal deficits. No facial asymmetry. MAE spontaneously. Psych: Responds to questions appropriately with normal affect.    Labs    Chemistry Recent Labs  Lab 07/31/18 1815 08/01/18 0427  NA 138 140  K 3.7 3.6  CL 105 106  CO2 21* 21*  GLUCOSE 110* 108*  BUN 16 10  CREATININE 0.58 0.55  CALCIUM 9.2 9.5  PROT 7.3  --   ALBUMIN 4.1  --   AST 24  --   ALT 19  --   ALKPHOS 54  --   BILITOT 0.6  --   GFRNONAA >60 >60  GFRAA >60 >60  ANIONGAP 12 13     Hematology Recent Labs  Lab 07/31/18 1815 08/01/18 0427 08/02/18 0215  WBC 7.6 8.1 7.3  RBC 3.71* 3.71* 3.59*  HGB 11.6* 11.9* 11.6*  HCT 37.1 36.3 35.5*  MCV 100.0 97.8 98.9  MCH 31.3 32.1 32.3  MCHC 31.3 32.8 32.7  RDW 13.5 13.7 13.9  PLT 408* 415* 379    Cardiac Enzymes Recent Labs  Lab 07/31/18 1815 07/31/18 2305 08/01/18 0427 08/01/18 1044  TROPONINI 0.14* 0.51* 0.67* 0.66*    Recent Labs  Lab 07/31/18 1824  TROPIPOC 0.18*     BNPNo results for input(s): BNP, PROBNP in the last 168 hours.   DDimer No results for input(s): DDIMER in the last  168 hours.   Radiology    Dg Chest 2 View  Result Date: 07/31/2018 CLINICAL DATA:  Acute chest pain today. EXAM: CHEST - 2 VIEW COMPARISON:  08/21/2017 lumbar spine FINDINGS: UPPER limits normal heart size noted. There is no evidence of focal airspace disease, pulmonary edema, suspicious pulmonary nodule/mass, pleural effusion, or pneumothorax. No acute bony abnormalities are identified. Sclerosis of a LOWER thoracic vertebral body is nonspecific (question T10). IMPRESSION: No evidence of acute cardiopulmonary disease. Nonspecific sclerosis of a LOWER thoracic vertebral body. Elective bone scan recommended for further evaluation. Electronically Signed   By: Harmon Pier M.D.   On: 07/31/2018 20:23   Telemetry    08/02/2018 NSR - Personally Reviewed  ECG    No new tracing as of 08/02/2018- Personally  Reviewed  Cardiac Studies   Echocardiogram 08/02/2018: Pending   Cardiac catheterization: 08/02/2018   Patient Profile     73 y.o. female who presented to the hospital with episodic chest pain, found to have an elevated troponin and non-STEMI.  Assessment & Plan    1. Non-STEMI: -Pt presented with episodic chest pain to UC>>>sent to [facility] for elevated trop  -Continue ASA, Plavix, statin, low dose BB -Trop, 0.51>0.67>0.66 -Plan for cardiac catheterization today, 08/02/2018 -Denies recurrent chest pain this AM   2. HLD: -Elevated, LDL on 07/31/2018 at 194 -On Liptior 80mg  -Will need lipid panel in 4-6 weeks for follow up    3. HTN: -Stable, 154/73>161/74>154/85 -Continue BB>>may need to titrate in the post cath setting   Signed, [name] NP-C [facility] Pager: [phone] 08/02/2018, 9:27 AM     Patient seen and examined. Agree with assessment and plan. No recurrent chest pain presently. Troponins are mildly positive, ECG with T wave inversion inferiorly. I have reviewed the risks, indications, and alternatives to cardiac catheterization, possible angioplasty, and stenting with the patient. Risks include but are not limited to bleeding, infection, vascular injury, stroke, myocardial infection, arrhythmia, kidney injury, radiation-related injury in the case of prolonged fluoroscopy use, emergency cardiac surgery, and death. The patient understands the risks of serious complication is 1-2 in 1000 with diagnostic cardiac cath and 1-2% or less with angioplasty/stenting. Plan cath with possible PCI later today.   [name], MD, [facility] 08/02/2018 10:51 AM    For questions or updates, please contact   Please consult [email or web address] for contact info under Cardiology/STEMI.

## 2018-08-02 NOTE — Brief Op Note (Addendum)
BRIEF CARDIAC CATHETERIZATION NOTE  DATE: 08/02/2018  TIME: 1:42 PM  PATIENT:  Lynn Hogan  73 y.o. female  PRE-OPERATIVE DIAGNOSIS:  NSTEMI  POST-OPERATIVE DIAGNOSIS:  NSTEMI  PROCEDURE:  Procedure(s): LEFT HEART CATH AND CORONARY ANGIOGRAPHY (N/A) CORONARY STENT INTERVENTION (N/A)  SURGEON:  Surgeon(s) and Role:    * Anastacia Reinecke, Cristal Deer, MD - Primary  FINDINGS: 1. Significant 2-vessel CAD with 50-60% mid LAD, 80-90% ostial D1, and sequential 70% and 90% proximal/mid RCA stenoses. 2. Upper normal to mildly elevated LVEDP with vigorous LV contraction. 3. Successful PCI to proximal/mid RCA using Synergy 3.0 x 38 mm DES with 0% residual stenosis and TIMI-3 flow.  RECOMMENDATIONS: 1. Aggressive secondary prevention. 2. DAPT with ASA and clopidogrel for at least 12 months. 3. If patient has recurrent angina, consider PCI to ostial D1 (though lesion is suboptimal intervention target due to potential to compromise proximal LAD).  Yvonne Kendall, MD [facility] Pager: [phone]

## 2018-08-02 NOTE — H&P (View-Only) (Signed)
Progress Note  Patient Name: Lynn Hogan Date of Encounter: 08/02/2018  Primary Cardiologist: New to [facility]  Subjective   Pt denies recurrent chest pain this AM. Continues on Hep gtt in anticipation of cath today   Inpatient Medications    Scheduled Meds: . aspirin EC  81 mg Oral Daily  . atorvastatin  80 mg Oral q1800  . clopidogrel  75 mg Oral Q breakfast  . metoprolol tartrate  12.5 mg Oral BID  . sodium chloride flush  3 mL Intravenous Once  . sodium chloride flush  3 mL Intravenous Q12H   Continuous Infusions: . sodium chloride    . sodium chloride 1 mL/kg/hr (08/02/18 0511)  . heparin 1,000 Units/hr (08/01/18 1735)   PRN Meds: sodium chloride, acetaminophen, nitroGLYCERIN, ondansetron (ZOFRAN) IV, sodium chloride flush   Vital Signs    Vitals:   08/01/18 1230 08/01/18 1952 08/02/18 0402 08/02/18 0918  BP: (!) 145/87 (!) 154/85 (!) 161/74 (!) 154/73  Pulse: 74 76 66 73  Resp: 17 14 15    Temp: 98.2 F (36.8 C) 98 F (36.7 C) 98.1 F (36.7 C) 98.3 F (36.8 C)  TempSrc: Oral Oral Oral Oral  SpO2: 95% 94% 95% 96%  Weight:      Height:        Intake/Output Summary (Last 24 hours) at 08/02/2018 0927 Last data filed at 08/02/2018 0409 Gross per 24 hour  Intake 1072.75 ml  Output -  Net 1072.75 ml   Filed Weights   07/31/18 1917 07/31/18 2300  Weight: 73 kg 73.6 kg   Physical Exam   General: Well developed, well nourished, NAD Skin: Warm, dry, intact  Head: Normocephalic, atraumatic, clear, moist mucus membranes. Neck: Negative for carotid bruits. No JVD Lungs:Clear to ausculation bilaterally. No wheezes, rales, or rhonchi. Breathing is unlabored. Cardiovascular: RRR with S1 S2. No murmurs, rubs, gallops, or LV heave appreciated. Abdomen: Soft, non-tender, non-distended with normoactive bowel sounds. No obvious abdominal masses. MSK: Strength and tone appear normal for age. 5/5 in all extremities Extremities: No edema. No clubbing or cyanosis. DP/PT  pulses 2+ bilaterally Neuro: Alert and oriented. No focal deficits. No facial asymmetry. MAE spontaneously. Psych: Responds to questions appropriately with normal affect.    Labs    Chemistry Recent Labs  Lab 07/31/18 1815 08/01/18 0427  NA 138 140  K 3.7 3.6  CL 105 106  CO2 21* 21*  GLUCOSE 110* 108*  BUN 16 10  CREATININE 0.58 0.55  CALCIUM 9.2 9.5  PROT 7.3  --   ALBUMIN 4.1  --   AST 24  --   ALT 19  --   ALKPHOS 54  --   BILITOT 0.6  --   GFRNONAA >60 >60  GFRAA >60 >60  ANIONGAP 12 13     Hematology Recent Labs  Lab 07/31/18 1815 08/01/18 0427 08/02/18 0215  WBC 7.6 8.1 7.3  RBC 3.71* 3.71* 3.59*  HGB 11.6* 11.9* 11.6*  HCT 37.1 36.3 35.5*  MCV 100.0 97.8 98.9  MCH 31.3 32.1 32.3  MCHC 31.3 32.8 32.7  RDW 13.5 13.7 13.9  PLT 408* 415* 379    Cardiac Enzymes Recent Labs  Lab 07/31/18 1815 07/31/18 2305 08/01/18 0427 08/01/18 1044  TROPONINI 0.14* 0.51* 0.67* 0.66*    Recent Labs  Lab 07/31/18 1824  TROPIPOC 0.18*     BNPNo results for input(s): BNP, PROBNP in the last 168 hours.   DDimer No results for input(s): DDIMER in the last  168 hours.   Radiology    Dg Chest 2 View  Result Date: 07/31/2018 CLINICAL DATA:  Acute chest pain today. EXAM: CHEST - 2 VIEW COMPARISON:  08/21/2017 lumbar spine FINDINGS: UPPER limits normal heart size noted. There is no evidence of focal airspace disease, pulmonary edema, suspicious pulmonary nodule/mass, pleural effusion, or pneumothorax. No acute bony abnormalities are identified. Sclerosis of a LOWER thoracic vertebral body is nonspecific (question T10). IMPRESSION: No evidence of acute cardiopulmonary disease. Nonspecific sclerosis of a LOWER thoracic vertebral body. Elective bone scan recommended for further evaluation. Electronically Signed   By: Jeffrey  Hu M.D.   On: 07/31/2018 20:23   Telemetry    08/02/2018 NSR - Personally Reviewed  ECG    No new tracing as of 08/02/2018- Personally  Reviewed  Cardiac Studies   Echocardiogram 08/02/2018: Pending   Cardiac catheterization: 08/02/2018   Patient Profile     72 y.o. female who presented to the hospital with episodic chest pain, found to have an elevated troponin and non-STEMI.  Assessment & Plan    1. Non-STEMI: -Pt presented with episodic chest pain to UC>>>sent to [facility] for elevated trop  -Continue ASA, Plavix, statin, low dose BB -Trop, 0.51>0.67>0.66 -Plan for cardiac catheterization today, 08/02/2018 -Denies recurrent chest pain this AM   2. HLD: -Elevated, LDL on 07/31/2018 at 194 -On Liptior 80mg -Will need lipid panel in 4-6 weeks for follow up    3. HTN: -Stable, 154/73>161/74>154/85 -Continue BB>>may need to titrate in the post cath setting   Signed, Jill McDaniel NP-C [facility] Pager: [phone] 08/02/2018, 9:27 AM     Patient seen and examined. Agree with assessment and plan. No recurrent chest pain presently. Troponins are mildly positive, ECG with T wave inversion inferiorly. I have reviewed the risks, indications, and alternatives to cardiac catheterization, possible angioplasty, and stenting with the patient. Risks include but are not limited to bleeding, infection, vascular injury, stroke, myocardial infection, arrhythmia, kidney injury, radiation-related injury in the case of prolonged fluoroscopy use, emergency cardiac surgery, and death. The patient understands the risks of serious complication is 1-2 in 1000 with diagnostic cardiac cath and 1-2% or less with angioplasty/stenting. Plan cath with possible PCI later today.   Thomas A. Kelly, MD, FACC 08/02/2018 10:51 AM    For questions or updates, please contact   Please consult [email or web address] for contact info under Cardiology/STEMI.  

## 2018-08-03 ENCOUNTER — Encounter (HOSPITAL_COMMUNITY): Payer: Self-pay | Admitting: Internal Medicine

## 2018-08-03 ENCOUNTER — Telehealth: Payer: Self-pay | Admitting: Cardiology

## 2018-08-03 LAB — BASIC METABOLIC PANEL
Anion gap: 8 (ref 5–15)
BUN: 11 mg/dL (ref 8–23)
CO2: 23 mmol/L (ref 22–32)
CREATININE: 0.62 mg/dL (ref 0.44–1.00)
Calcium: 9 mg/dL (ref 8.9–10.3)
Chloride: 110 mmol/L (ref 98–111)
GFR calc Af Amer: >60 mL/min (ref >60)
GFR calc non Af Amer: >60 mL/min (ref >60)
Glucose, Bld: 103 mg/dL — ABNORMAL HIGH (ref 70–99)
Potassium: 3.6 mmol/L (ref 3.5–5.1)
SODIUM: 141 mmol/L (ref 135–145)

## 2018-08-03 LAB — CBC
HCT: 34.6 % — ABNORMAL LOW (ref 36.0–46.0)
Hemoglobin: 11.1 g/dL — ABNORMAL LOW (ref 12.0–15.0)
MCH: 31.7 pg (ref 26.0–34.0)
MCHC: 32.1 g/dL (ref 30.0–36.0)
MCV: 98.9 fL (ref 80.0–100.0)
Platelets: 359 10*3/uL (ref 150–400)
RBC: 3.5 MIL/uL — ABNORMAL LOW (ref 3.87–5.11)
RDW: 13.9 % (ref 11.5–15.5)
WBC: 8.9 10*3/uL (ref 4.0–10.5)
nRBC: 0 % (ref 0.0–0.2)

## 2018-08-03 MED ORDER — ASPIRIN 81 MG PO TBEC: 81.0000 mg | DELAYED_RELEASE_TABLET | Freq: Every day | ORAL | 3 refills | Status: AC

## 2018-08-03 MED ORDER — CLOPIDOGREL BISULFATE 75 MG PO TABS: 75.0000 mg | ORAL_TABLET | Freq: Every day | ORAL | 11 refills | Status: DC

## 2018-08-03 MED ORDER — AMLODIPINE BESYLATE 5 MG PO TABS: 5.0000 mg | ORAL_TABLET | Freq: Every day | ORAL | 11 refills | Status: DC

## 2018-08-03 MED ORDER — METOPROLOL TARTRATE 25 MG PO TABS: 25.0000 mg | ORAL_TABLET | Freq: Two times a day (BID) | ORAL | 6 refills | Status: DC

## 2018-08-03 MED ORDER — ATORVASTATIN CALCIUM 80 MG PO TABS: 80.0000 mg | ORAL_TABLET | Freq: Every day | ORAL | 6 refills | Status: DC

## 2018-08-03 MED ORDER — NITROGLYCERIN 0.4 MG SL SUBL: 0.4000 mg | SUBLINGUAL_TABLET | SUBLINGUAL | 12 refills | Status: DC | PRN

## 2018-08-03 MED FILL — CLOPIDOGREL 75 MG TABLET: 75 | 30 days supply | Qty: 30 | Fill #0 | Status: TO

## 2018-08-03 MED FILL — AMLODIPINE BESYLATE 5 MG TA: 5 | 30 days supply | Qty: 30 | Fill #0 | Status: TO

## 2018-08-03 MED FILL — NITROGLYCERIN 0.4 MG TAB SL: 0.4 | 7 days supply | Qty: 25 | Fill #0 | Status: TO

## 2018-08-03 MED FILL — ASPIRIN LOW DOSE 81 MG TBEC: 81 | 90 days supply | Qty: 90 | Fill #0 | Status: TO

## 2018-08-03 MED FILL — METOPROLOL TARTRATE 25 MG T: 25 | 30 days supply | Qty: 60 | Fill #0 | Status: TO

## 2018-08-03 MED FILL — ATORVASTATIN CALCIUM 80 MG: 80 | 30 days supply | Qty: 30 | Fill #0 | Status: TO

## 2018-08-03 NOTE — Telephone Encounter (Signed)
Patient is currently admitted. Attempt TOC call 08/04/2018

## 2018-08-03 NOTE — Telephone Encounter (Signed)
TOC Patient-   Please call Patient-   Pt has an appointment with Corine Shelter on 08-11-18.

## 2018-08-03 NOTE — Discharge Summary (Addendum)
Discharge Summary    Patient ID: Lynn Hogan MRN: [id number]; DOB: March 01, 1946  Admit date: 07/31/2018 Discharge date: 08/03/2018  Primary Care Provider: Si Gaul, DO  Primary Cardiologist: New to Dr. Tresa Endo   Discharge Diagnoses    Principal Problem:   NSTEMI (non-ST elevated myocardial infarction) [facility]) Active Problems:   Hyperlipidemia   Mild to moderate MR  CAD  Allergies Allergies  Allergen Reactions  . Ibuprofen Palpitations  . Nsaids Palpitations    Patient stated she has not tried all NSAIDS to see if they give her palpitations, but she is not willing to try anything else- just in case  . Banana Nausea Only    Diagnostic Studies/Procedures    TRANSTHORACIC ECHOCARDIOGRAM 08/01/18 IMPRESSIONS  1. The left ventricle has normal systolic function of 60-65%. The cavity size is normal. There is normal left ventricular wall thickness. Echo evidence of normal diastolic filling patterns.  2. Mildly dilated left atrial size.  3. Normal right atrial size.  4. The mitral valve normal in structure. Regurgitation is mild to moderate by color flow Doppler.  5. Aortic valve sclerosis without stenosis.  6. The aortic root and ascending aortaare normal is size and structure.  7. No atrial level shunt detected by color flow Doppler.  FINDINGS  Left Ventricle: No evidence of left ventricular regional wall motion abnormalities. The left ventricle has normal systolic function of 60-65%. The cavity size is normal. There is normal left ventricular wall thickness. Echo evidence of normal diastolic  filling patterns. Right Ventricle: The right ventricle is normal in size. There is no hypertrophy. There is normal systolic function. Right ventricular systolic pressure could not be assessed. Left Atrium: The left atrium is mildly dilated. Right Atrium: The right atrial size is normal in size. Interatrial Septum: No atrial level shunt detected by color flow Doppler.  Pericardium: There  is no evidence of pericardial effusion. Mitral Valve: The mitral valve normal in structure. Regurgitation is mild to moderate by color flow Doppler. Tricuspid Valve: The tricuspid valve is normal in structure. Tricuspid regurgitation is trivial by color flow Doppler. Aortic Valve: The aortic valve tricuspid. There is mild thickening and mild calcification of the aortic valve. Pulmonic Valve: The pulmonic valve is normal. Pulmonic valve regurgitation is not visualized by color flow Doppler. Aorta: The aortic root and ascending aortaare normal is size and structure. Venous: The inferior vena cava was normal in size with greater than 50% respiratory variablity. In comparison to the previous echocardiogram(s): There are no prior studies on this patient for comparison purposes.  CORONARY STENT INTERVENTION 08/02/2018  LEFT HEART CATH AND CORONARY ANGIOGRAPHY  Conclusion   Conclusions: 1. Significant two-vessel coronary artery disease with 50-60% mid LAD, 80-90% ostial D1, and sequential 70% and 90% proximal/mid RCA stenoses. 2. Upper normal to mildly elevated LVEDP with vigorous left ventricular contraction. 3. Successful PCI to proximal/mid RCA using Synergy 3.0 x 38 mm DES with 0% residual stenosis and TIMI-3 flow.  Recommendations: 1. Aggressive secondary prevention. 2. Dual antiplatelet therapy with aspirin and clopidogrel for at least 12 months. 3. Medical therapy of residual LAD/diagonal disease.  If Ms. Broskey has recurrent angina, PCI to D1 could be considered (though risk is increased by true ostium involvement and potential for affecting the proximal LAD.   Diagnostic  Dominance: Right    Intervention       History of Present Illness     73 y.o. female who presented to the hospital with episodic chest pain, found to have  an elevated troponin and non-STEMI.  Lynn Hogan is a previously healthy woman who presented with chest pain. She was in her usual state of health until 1/31  afternoon when she developed a burning sensation in her chest when she was resting at home. This sensation occurred after eating so she thought that it was reflux (although she does not have a history of reflux). She got to work 2/1 and had a recurrence of the burning sensation in her chest. The burning sensation was located in the center of her chest and radiated up to her jaw. She felt nauseous, short of breath and sweaty. She tried to walk to help relieve the pain and slowly it subsided. Unfortunately, the pain returned so she left work and was seen by her PCP. While at her PCP office, she had another episode of pain and was sent to the ER for evaluation. While in the ER, her troponins were found to be positive. She denied any current symptoms.  Hospital Course     Consultants: None  1. NSTEMI - Troponin peaked at 0.66. treated with heparin. Cath showed significant two-vessel coronary artery disease with 50-60% mid LAD, 80-90% ostial D1, and sequential 70% and 90% proximal/mid RCA stenoses. S/p Successful PCI to proximal/mid RCA using Synergy 3.0 x 38 mm DES. Medical therapy of residual LAD/diagonal disease.  If Lynn Hogan has recurrent angina, PCI to D1 could be considered. - Ambulated well. No recurrent chest pain.  - Continue ASA, Plavix, statin and BB.   2. HLD - 07/31/2018: Cholesterol 257; HDL 48; LDL Cholesterol 194; Triglycerides 75; VLDL 15  - Continue Lipitor 80mg  qd. Labs in 6-8 weeks.   3. Mild to moderate MR - follow with routine echo   Discharge Vitals Blood pressure (!) 141/74, pulse 64, temperature 98.2 F (36.8 C), temperature source Oral, resp. rate 10, height 5\' 1"  (1.549 m), weight 74.8 kg, SpO2 96 %.  Filed Weights   07/31/18 1917 07/31/18 2300 08/03/18 0539  Weight: 73 kg 73.6 kg 74.8 kg   Physical Exam  Constitutional: She is oriented to person, place, and time and well-developed, well-nourished, and in no distress.  HENT:  Head: Normocephalic and atraumatic.    Eyes: Pupils are equal, round, and reactive to light. EOM are normal.  Neck: Normal range of motion. Neck supple.  Cardiovascular: Normal rate and regular rhythm.  Right radial cath site without hematoma   Pulmonary/Chest: Effort normal and breath sounds normal.  Abdominal: Soft. Bowel sounds are normal.  Musculoskeletal: Normal range of motion.  Neurological: She is alert and oriented to person, place, and time.  Skin: Skin is warm and dry.  Psychiatric: Affect normal.    Labs & Radiologic Studies    CBC Recent Labs    08/02/18 0215 08/03/18 0530  WBC 7.3 8.9  HGB 11.6* 11.1*  HCT 35.5* 34.6*  MCV 98.9 98.9  PLT 379 359   Basic Metabolic Panel Recent Labs    16/03/9601/02/20 0427 08/03/18 0530  NA 140 141  K 3.6 3.6  CL 106 110  CO2 21* 23  GLUCOSE 108* 103*  BUN 10 11  CREATININE 0.55 0.62  CALCIUM 9.5 9.0   Liver Function Tests Recent Labs    07/31/18 1815  AST 24  ALT 19  ALKPHOS 54  BILITOT 0.6  PROT 7.3  ALBUMIN 4.1   Recent Labs    07/31/18 1815  LIPASE 25   Cardiac Enzymes Recent Labs    07/31/18 2305 08/01/18 0427 08/01/18  1044  TROPONINI 0.51* 0.67* 0.66*   Hemoglobin A1C Recent Labs    07/31/18 2051  HGBA1C 5.7*   Fasting Lipid Panel Recent Labs    07/31/18 2051  CHOL 257*  HDL 48  LDLCALC 194*  TRIG 75  CHOLHDL 5.4   Thyroid Function Tests Recent Labs    07/31/18 2051  TSH 1.824   _____________  Dg Chest 2 View  Result Date: 07/31/2018 CLINICAL DATA:  Acute chest pain today. EXAM: CHEST - 2 VIEW COMPARISON:  08/21/2017 lumbar spine FINDINGS: UPPER limits normal heart size noted. There is no evidence of focal airspace disease, pulmonary edema, suspicious pulmonary nodule/mass, pleural effusion, or pneumothorax. No acute bony abnormalities are identified. Sclerosis of a LOWER thoracic vertebral body is nonspecific (question T10). IMPRESSION: No evidence of acute cardiopulmonary disease. Nonspecific sclerosis of a LOWER  thoracic vertebral body. Elective bone scan recommended for further evaluation. Electronically Signed   By: Harmon PierJeffrey  Hu M.D.   On: 07/31/2018 20:23   Disposition   Pt is being discharged home today in good condition.  Follow-up Plans & Appointments    Follow-up Information    Abelino DerrickKilroy, Luke K, [place]. Go on 08/11/2018.   Specialties:  Cardiology, Radiology Why:  @9 :30am for hospital follow up, please arrive 15 minutes early  Contact information: [address] [place] [id number] [phone]          Discharge Instructions    AMB Referral to Cardiac Rehabilitation - Phase II   Complete by:  As directed    Diagnosis:   Coronary Stents NSTEMI     Diet - low sodium heart healthy   Complete by:  As directed    Discharge instructions   Complete by:  As directed    NO HEAVY LIFTING (>10lbs) X 2 WEEKS. NO SEXUAL ACTIVITY X 2 WEEKS. NO DRIVING X 1 WEEK. NO SOAKING BATHS, HOT TUBS, POOLS, ETC., X 7 DAYS.  Return to work after seen in clinic.   Increase activity slowly   Complete by:  As directed       Discharge Medications   Allergies as of 08/03/2018      Reactions   Ibuprofen Palpitations   Nsaids Palpitations   Patient stated she has not tried all NSAIDS to see if they give her palpitations, but she is not willing to try anything else- just in case   Banana Nausea Only      Medication List    STOP taking these medications   acetaminophen 500 MG tablet Commonly known as:  TYLENOL     TAKE these medications   aspirin 81 MG EC tablet Take 1 tablet (81 mg total) by mouth daily.   atorvastatin 80 MG tablet Commonly known as:  LIPITOR Take 1 tablet (80 mg total) by mouth daily at 6 PM.   clopidogrel 75 MG tablet Commonly known as:  PLAVIX Take 1 tablet (75 mg total) by mouth daily with breakfast.   GLUCOSAMINE CHONDR COMPLEX PO Take 1 tablet by mouth 2 (two) times daily with a meal.   metoprolol tartrate 25 MG tablet Commonly known as:   LOPRESSOR Take 1 tablet (25 mg total) by mouth 2 (two) times daily.   nitroGLYCERIN 0.4 MG SL tablet Commonly known as:  NITROSTAT Place 1 tablet (0.4 mg total) under the tongue every 5 (five) minutes x 3 doses as needed for chest pain.        Acute coronary syndrome (MI, NSTEMI, STEMI, etc) this admission?: Yes.  AHA/ACC Clinical Performance & Quality Measures: 1. Aspirin prescribed? - Yes 2. ADP Receptor Inhibitor (Plavix/Clopidogrel, Brilinta/Ticagrelor or Effient/Prasugrel) prescribed (includes medically managed patients)? - Yes 3. Beta Blocker prescribed? - Yes 4. High Intensity Statin (Lipitor 40-80mg  or Crestor 20-40mg ) prescribed? - Yes 5. EF assessed during THIS hospitalization? - Yes 6. For EF <40%, was ACEI/ARB prescribed? - Not Applicable (EF >/= 40%) 7. For EF <40%, Aldosterone Antagonist (Spironolactone or Eplerenone) prescribed? - Not Applicable (EF >/= 40%) 8. Cardiac Rehab Phase II ordered (Included Medically managed Patients)? - Yes     Outstanding Labs/Studies   Consider OP f/u labs 6-8 weeks given statin initiation this admission.  Duration of Discharge Encounter   Greater than 30 minutes including physician time.  Lorelei Pont, PA 08/03/2018, 8:47 AM   Patient seen and examined. Agree with assessment and plan.  I personally reviewed the images from the catheterization laboratory and was called into the lab yesterday by Dr. Okey Dupre to review her findings.  Patient underwent successful stenting of tandem proximal to mid RCA stenoses.  She has a high-grade 85 to 90% very high diagonal (almost ramus intermediate like) vessel.  Recommend initial medical therapy for this and if in the future intervention is necessary stenting may be difficult in light of the ostial nature.  Recommend adding amlodipine 5 mg to her current beta-blocker therapy.  Plan aggressive lipid-lowering therapy with target LDL less than 70.  Had a long discussion with the patient  regarding her catheterization findings and treatment strategy.  I will see her in the office in follow-up of her initial hospital PA duration.   [name], MD, [facility] 08/03/2018 10:07 AM

## 2018-08-03 NOTE — Progress Notes (Signed)
CARDIAC REHAB PHASE I   PRE:  Rate/Rhythm: 92 SR  BP:  Supine:   Sitting: 141/70  Standing:    SaO2: 97%RA  MODE:  Ambulation: 500 ft   POST:  Rate/Rhythm: 101 ST  BP:  Supine:   Sitting: 120/72  Standing:    SaO2: 95%RA 0826-0915 Pt walked 500 ft on RA with fairly steady gait. A little wobbly. No CP. Discussed importance of plavix with stent. Reviewed heart healthy food chioices, NTG use, MI restrictions, ex ed and CRP 2. Referred to [place] CRP 2.   Luetta Nutting, RN BSN  08/03/2018 9:11 AM

## 2018-08-04 NOTE — Telephone Encounter (Signed)
[facility]  Patient understands to follow up with provider Corine ShelterLuke Kilroy, PA-C on 08/11/2018 [facility]. Patient understands discharge instructions? Yes Patient understands medications and regiment? yes Patient understands to bring all medications to this visit? Yes

## 2018-08-04 NOTE — Telephone Encounter (Signed)
Called patient, LVM advising to return call regarding recent discharge and upcoming appointment. Left call back number.

## 2018-08-08 ENCOUNTER — Encounter (HOSPITAL_COMMUNITY)
Admission: EM | Disposition: A | Discharge status: Home / Self Care | Payer: Self-pay | Source: Home / Self Care | Attending: Cardiovascular Disease

## 2018-08-08 ENCOUNTER — Inpatient Hospital Stay (HOSPITAL_COMMUNITY)
Admission: EM | Admit: 2018-08-08 | Discharge: 2018-08-11 | DRG: 246 | Disposition: A | Discharge status: Home / Self Care | Payer: Medicare HMO | Attending: Cardiovascular Disease | Admitting: Cardiovascular Disease

## 2018-08-08 ENCOUNTER — Encounter (HOSPITAL_COMMUNITY): Payer: Self-pay | Admitting: Emergency Medicine

## 2018-08-08 ENCOUNTER — Other Ambulatory Visit: Payer: Self-pay

## 2018-08-08 ENCOUNTER — Emergency Department (HOSPITAL_COMMUNITY): Payer: Medicare HMO

## 2018-08-08 DIAGNOSIS — Z955 Presence of coronary angioplasty implant and graft: Secondary | ICD-10-CM

## 2018-08-08 DIAGNOSIS — I214 Non-ST elevation (NSTEMI) myocardial infarction: Secondary | ICD-10-CM | POA: Diagnosis present

## 2018-08-08 DIAGNOSIS — Z886 Allergy status to analgesic agent status: Secondary | ICD-10-CM | POA: Diagnosis not present

## 2018-08-08 DIAGNOSIS — I5021 Acute systolic (congestive) heart failure: Secondary | ICD-10-CM | POA: Diagnosis present

## 2018-08-08 DIAGNOSIS — I2102 ST elevation (STEMI) myocardial infarction involving left anterior descending coronary artery: Secondary | ICD-10-CM | POA: Diagnosis present

## 2018-08-08 DIAGNOSIS — I361 Nonrheumatic tricuspid (valve) insufficiency: Secondary | ICD-10-CM | POA: Diagnosis not present

## 2018-08-08 DIAGNOSIS — Z7902 Long term (current) use of antithrombotics/antiplatelets: Secondary | ICD-10-CM | POA: Diagnosis not present

## 2018-08-08 DIAGNOSIS — I213 ST elevation (STEMI) myocardial infarction of unspecified site: Secondary | ICD-10-CM | POA: Diagnosis present

## 2018-08-08 DIAGNOSIS — E785 Hyperlipidemia, unspecified: Secondary | ICD-10-CM | POA: Diagnosis present

## 2018-08-08 DIAGNOSIS — I2510 Atherosclerotic heart disease of native coronary artery without angina pectoris: Secondary | ICD-10-CM

## 2018-08-08 DIAGNOSIS — Z79899 Other long term (current) drug therapy: Secondary | ICD-10-CM | POA: Diagnosis not present

## 2018-08-08 DIAGNOSIS — Z8249 Family history of ischemic heart disease and other diseases of the circulatory system: Secondary | ICD-10-CM | POA: Diagnosis not present

## 2018-08-08 DIAGNOSIS — Z91018 Allergy to other foods: Secondary | ICD-10-CM

## 2018-08-08 DIAGNOSIS — Z7982 Long term (current) use of aspirin: Secondary | ICD-10-CM

## 2018-08-08 DIAGNOSIS — E782 Mixed hyperlipidemia: Secondary | ICD-10-CM | POA: Diagnosis not present

## 2018-08-08 HISTORY — PX: CORONARY/GRAFT ACUTE MI REVASCULARIZATION: CATH118305

## 2018-08-08 HISTORY — DX: Unspecified systolic (congestive) heart failure: I50.20

## 2018-08-08 HISTORY — DX: ST elevation (STEMI) myocardial infarction of unspecified site: I21.3

## 2018-08-08 HISTORY — PX: LEFT HEART CATH AND CORONARY ANGIOGRAPHY: CATH118249

## 2018-08-08 HISTORY — DX: Non-ST elevation (NSTEMI) myocardial infarction: I21.4

## 2018-08-08 LAB — PROTIME-INR
INR: 1
Prothrombin Time: 13.1 seconds (ref 11.4–15.2)

## 2018-08-08 LAB — COMPREHENSIVE METABOLIC PANEL
ALT: 14 U/L (ref 0–44)
AST: 21 U/L (ref 15–41)
Albumin: 3.5 g/dL (ref 3.5–5.0)
Alkaline Phosphatase: 57 U/L (ref 38–126)
Anion gap: 11 (ref 5–15)
BILIRUBIN TOTAL: 0.7 mg/dL (ref 0.3–1.2)
BUN: 9 mg/dL (ref 8–23)
CO2: 23 mmol/L (ref 22–32)
Calcium: 9.2 mg/dL (ref 8.9–10.3)
Chloride: 101 mmol/L (ref 98–111)
Creatinine, Ser: 0.61 mg/dL (ref 0.44–1.00)
GFR calc Af Amer: >60 mL/min (ref >60)
GFR calc non Af Amer: >60 mL/min (ref >60)
Glucose, Bld: 172 mg/dL — ABNORMAL HIGH (ref 70–99)
Potassium: 3.8 mmol/L (ref 3.5–5.1)
Sodium: 135 mmol/L (ref 135–145)
TOTAL PROTEIN: 7 g/dL (ref 6.5–8.1)

## 2018-08-08 LAB — MRSA PCR SCREENING: MRSA BY PCR: NEGATIVE

## 2018-08-08 LAB — CBC WITH DIFFERENTIAL/PLATELET
Abs Immature Granulocytes: 0.03 10*3/uL (ref 0.00–0.07)
BASOS ABS: 0 10*3/uL (ref 0.0–0.1)
BASOS PCT: 0 %
EOS PCT: 0 %
Eosinophils Absolute: 0 10*3/uL (ref 0.0–0.5)
HCT: 35.7 % — ABNORMAL LOW (ref 36.0–46.0)
Hemoglobin: 11.5 g/dL — ABNORMAL LOW (ref 12.0–15.0)
Immature Granulocytes: 0 %
Lymphocytes Relative: 23 %
Lymphs Abs: 2.3 10*3/uL (ref 0.7–4.0)
MCH: 32.1 pg (ref 26.0–34.0)
MCHC: 32.2 g/dL (ref 30.0–36.0)
MCV: 99.7 fL (ref 80.0–100.0)
Monocytes Absolute: 0.8 10*3/uL (ref 0.1–1.0)
Monocytes Relative: 8 %
Neutro Abs: 6.8 10*3/uL (ref 1.7–7.7)
Neutrophils Relative %: 69 %
PLATELETS: 454 10*3/uL — AB (ref 150–400)
RBC: 3.58 MIL/uL — ABNORMAL LOW (ref 3.87–5.11)
RDW: 12.8 % (ref 11.5–15.5)
WBC: 9.9 10*3/uL (ref 4.0–10.5)
nRBC: 0 % (ref 0.0–0.2)

## 2018-08-08 LAB — TROPONIN I
TROPONIN I: 0.3 ng/mL — AB (ref <0.03)
Troponin I: 17.14 ng/mL (ref <0.03)

## 2018-08-08 LAB — POCT I-STAT 7, (LYTES, BLD GAS, ICA,H+H)
ACID-BASE EXCESS: 1 mmol/L (ref 0.0–2.0)
Bicarbonate: 21.3 mmol/L (ref 20.0–28.0)
Calcium, Ion: 1.11 mmol/L — ABNORMAL LOW (ref 1.15–1.40)
HCT: 34 % — ABNORMAL LOW (ref 36.0–46.0)
Hemoglobin: 11.6 g/dL — ABNORMAL LOW (ref 12.0–15.0)
O2 Saturation: 99 %
PO2 ART: 125 mmHg — AB (ref 83.0–108.0)
Potassium: 3.8 mmol/L (ref 3.5–5.1)
Sodium: 135 mmol/L (ref 135–145)
TCO2: 22 mmol/L (ref 22–32)
pCO2 arterial: 21.8 mmHg — ABNORMAL LOW (ref 32.0–48.0)
pH, Arterial: 7.597 — ABNORMAL HIGH (ref 7.350–7.450)

## 2018-08-08 LAB — POCT ACTIVATED CLOTTING TIME: Activated Clotting Time: 467 seconds

## 2018-08-08 LAB — I-STAT CREATININE, ED: Creatinine, Ser: 0.5 mg/dL (ref 0.44–1.00)

## 2018-08-08 LAB — APTT: aPTT: 29 seconds (ref 24–36)

## 2018-08-08 LAB — LIPID PANEL
Cholesterol: 127 mg/dL (ref 0–200)
HDL: 38 mg/dL — ABNORMAL LOW (ref >40)
LDL Cholesterol: 71 mg/dL (ref 0–99)
Total CHOL/HDL Ratio: 3.3 RATIO
Triglycerides: 92 mg/dL (ref <150)
VLDL: 18 mg/dL (ref 0–40)

## 2018-08-08 LAB — HEMOGLOBIN A1C
Hgb A1c MFr Bld: 5.7 % — ABNORMAL HIGH (ref 4.8–5.6)
Mean Plasma Glucose: 116.89 mg/dL

## 2018-08-08 LAB — POCT I-STAT CREATININE: Creatinine, Ser: 0.4 mg/dL — ABNORMAL LOW (ref 0.44–1.00)

## 2018-08-08 SURGERY — CORONARY/GRAFT ACUTE MI REVASCULARIZATION
Anesthesia: LOCAL

## 2018-08-08 MED ORDER — MORPHINE SULFATE (PF) 4 MG/ML IV SOLN
4.0000 mg | Freq: Once | INTRAVENOUS | Status: AC
  Administered 2018-08-08: 4 mg via INTRAVENOUS
  Filled 2018-08-08: qty 1

## 2018-08-08 MED ORDER — METOPROLOL TARTRATE 25 MG PO TABS
25.0000 mg | ORAL_TABLET | Freq: Two times a day (BID) | ORAL | Status: DC
  Administered 2018-08-08 – 2018-08-11 (×6): 25 mg via ORAL
  Filled 2018-08-08 (×7): qty 1

## 2018-08-08 MED ORDER — MIDAZOLAM HCL 2 MG/2ML IJ SOLN
INTRAMUSCULAR | Status: DC | PRN
  Administered 2018-08-08 (×2): 1 mg via INTRAVENOUS

## 2018-08-08 MED ORDER — ACETAMINOPHEN 325 MG PO TABS
650.0000 mg | ORAL_TABLET | ORAL | Status: DC | PRN
  Administered 2018-08-09 – 2018-08-11 (×6): 650 mg via ORAL
  Filled 2018-08-08 (×6): qty 2

## 2018-08-08 MED ORDER — LIDOCAINE HCL (PF) 1 % IJ SOLN
INTRAMUSCULAR | Status: DC | PRN
  Administered 2018-08-08: 2 mL

## 2018-08-08 MED ORDER — MIDAZOLAM HCL 2 MG/2ML IJ SOLN
INTRAMUSCULAR | Status: AC
  Filled 2018-08-08: qty 2

## 2018-08-08 MED ORDER — SODIUM CHLORIDE 0.9 % IV SOLN: 250.0000 mL | INTRAVENOUS | Status: DC | PRN

## 2018-08-08 MED ORDER — HEPARIN (PORCINE) IN NACL 1000-0.9 UT/500ML-% IV SOLN
INTRAVENOUS | Status: AC
  Filled 2018-08-08: qty 500

## 2018-08-08 MED ORDER — SODIUM CHLORIDE 0.9 % IV SOLN
INTRAVENOUS | Status: AC | PRN
  Administered 2018-08-08: 1.75 mg/kg/h via INTRAVENOUS

## 2018-08-08 MED ORDER — NITROGLYCERIN 1 MG/10 ML FOR IR/CATH LAB
INTRA_ARTERIAL | Status: AC
  Filled 2018-08-08: qty 10

## 2018-08-08 MED ORDER — HYDRALAZINE HCL 20 MG/ML IJ SOLN: 5.0000 mg | INTRAMUSCULAR | Status: AC | PRN

## 2018-08-08 MED ORDER — ONDANSETRON HCL 4 MG/2ML IJ SOLN: 4.0000 mg | Freq: Four times a day (QID) | INTRAMUSCULAR | Status: DC | PRN

## 2018-08-08 MED ORDER — SODIUM CHLORIDE 0.9 % IV SOLN
INTRAVENOUS | Status: DC
  Administered 2018-08-08: 20 mL via INTRAVENOUS

## 2018-08-08 MED ORDER — FENTANYL CITRATE (PF) 100 MCG/2ML IJ SOLN
INTRAMUSCULAR | Status: DC | PRN
  Administered 2018-08-08 (×2): 25 ug via INTRAVENOUS

## 2018-08-08 MED ORDER — HEPARIN SODIUM (PORCINE) 5000 UNIT/ML IJ SOLN
4000.0000 [IU] | Freq: Once | INTRAMUSCULAR | Status: AC
  Administered 2018-08-08: 4000 [IU] via INTRAVENOUS

## 2018-08-08 MED ORDER — SODIUM CHLORIDE 0.9% FLUSH: 3.0000 mL | INTRAVENOUS | Status: DC | PRN

## 2018-08-08 MED ORDER — VERAPAMIL HCL 2.5 MG/ML IV SOLN
INTRAVENOUS | Status: AC
  Filled 2018-08-08: qty 2

## 2018-08-08 MED ORDER — TICAGRELOR 90 MG PO TABS
ORAL_TABLET | ORAL | Status: DC | PRN
  Administered 2018-08-08: 180 mg via ORAL

## 2018-08-08 MED ORDER — HEPARIN SODIUM (PORCINE) 5000 UNIT/ML IJ SOLN
INTRAMUSCULAR | Status: AC
  Filled 2018-08-08: qty 1

## 2018-08-08 MED ORDER — SODIUM CHLORIDE 0.9% FLUSH
3.0000 mL | Freq: Two times a day (BID) | INTRAVENOUS | Status: DC
  Administered 2018-08-08 – 2018-08-10 (×6): 3 mL via INTRAVENOUS

## 2018-08-08 MED ORDER — DIAZEPAM 5 MG PO TABS: 5.0000 mg | ORAL_TABLET | Freq: Four times a day (QID) | ORAL | Status: DC | PRN

## 2018-08-08 MED ORDER — BIVALIRUDIN TRIFLUOROACETATE 250 MG IV SOLR
INTRAVENOUS | Status: AC
  Filled 2018-08-08: qty 250

## 2018-08-08 MED ORDER — TICAGRELOR 90 MG PO TABS
90.0000 mg | ORAL_TABLET | Freq: Two times a day (BID) | ORAL | Status: DC
  Administered 2018-08-08 – 2018-08-11 (×7): 90 mg via ORAL
  Filled 2018-08-08 (×7): qty 1

## 2018-08-08 MED ORDER — SODIUM CHLORIDE 0.9 % IV SOLN
INTRAVENOUS | Status: DC
  Administered 2018-08-08: 15:00:00 via INTRAVENOUS

## 2018-08-08 MED ORDER — NITROGLYCERIN IN D5W 200-5 MCG/ML-% IV SOLN
INTRAVENOUS | Status: AC
  Filled 2018-08-08: qty 250

## 2018-08-08 MED ORDER — AMLODIPINE BESYLATE 5 MG PO TABS
5.0000 mg | ORAL_TABLET | Freq: Every day | ORAL | Status: DC
  Filled 2018-08-08: qty 1

## 2018-08-08 MED ORDER — ATORVASTATIN CALCIUM 80 MG PO TABS
80.0000 mg | ORAL_TABLET | Freq: Every day | ORAL | Status: DC
  Administered 2018-08-08 – 2018-08-11 (×4): 80 mg via ORAL
  Filled 2018-08-08 (×4): qty 1

## 2018-08-08 MED ORDER — SODIUM CHLORIDE 0.9 % IV SOLN
1.7500 mg/kg/h | INTRAVENOUS | Status: DC
  Administered 2018-08-08: 1.75 mg/kg/h via INTRAVENOUS
  Filled 2018-08-08 (×2): qty 250

## 2018-08-08 MED ORDER — ASPIRIN 81 MG PO CHEW
81.0000 mg | CHEWABLE_TABLET | Freq: Every day | ORAL | Status: DC
  Administered 2018-08-08: 81 mg via ORAL
  Filled 2018-08-08: qty 1

## 2018-08-08 MED ORDER — BIVALIRUDIN BOLUS VIA INFUSION - CUPID
INTRAVENOUS | Status: DC | PRN
  Administered 2018-08-08: 56.1 mg via INTRAVENOUS

## 2018-08-08 MED ORDER — NITROGLYCERIN 1 MG/10 ML FOR IR/CATH LAB
INTRA_ARTERIAL | Status: DC | PRN
  Administered 2018-08-08 (×3): 200 ug via INTRACORONARY

## 2018-08-08 MED ORDER — NITROGLYCERIN IN D5W 200-5 MCG/ML-% IV SOLN
INTRAVENOUS | Status: AC | PRN
  Administered 2018-08-08: 10 ug/min via INTRAVENOUS

## 2018-08-08 MED ORDER — IOHEXOL 350 MG/ML SOLN
INTRAVENOUS | Status: DC | PRN
  Administered 2018-08-08: 190 mL via INTRAVENOUS

## 2018-08-08 MED ORDER — LABETALOL HCL 5 MG/ML IV SOLN: 10.0000 mg | INTRAVENOUS | Status: AC | PRN

## 2018-08-08 MED ORDER — HEPARIN (PORCINE) IN NACL 1000-0.9 UT/500ML-% IV SOLN
INTRAVENOUS | Status: DC | PRN
  Administered 2018-08-08 (×2): 500 mL

## 2018-08-08 MED ORDER — LIDOCAINE HCL (PF) 1 % IJ SOLN
INTRAMUSCULAR | Status: AC
  Filled 2018-08-08: qty 30

## 2018-08-08 MED ORDER — NITROGLYCERIN 0.4 MG SL SUBL: 0.4000 mg | SUBLINGUAL_TABLET | SUBLINGUAL | Status: DC | PRN

## 2018-08-08 MED ORDER — TICAGRELOR 90 MG PO TABS
ORAL_TABLET | ORAL | Status: AC
  Filled 2018-08-08: qty 2

## 2018-08-08 MED ORDER — FENTANYL CITRATE (PF) 100 MCG/2ML IJ SOLN
INTRAMUSCULAR | Status: AC
  Filled 2018-08-08: qty 2

## 2018-08-08 MED ORDER — VERAPAMIL HCL 2.5 MG/ML IV SOLN
INTRAVENOUS | Status: DC | PRN
  Administered 2018-08-08: 11:00:00 via INTRA_ARTERIAL

## 2018-08-08 MED ORDER — ASPIRIN EC 81 MG PO TBEC
81.0000 mg | DELAYED_RELEASE_TABLET | Freq: Every day | ORAL | Status: DC
  Administered 2018-08-09 – 2018-08-11 (×3): 81 mg via ORAL
  Filled 2018-08-08 (×3): qty 1

## 2018-08-08 MED ORDER — ASPIRIN 81 MG PO TBEC: 81.0000 mg | DELAYED_RELEASE_TABLET | Freq: Every day | ORAL | Status: DC

## 2018-08-08 SURGICAL SUPPLY — 18 items
BALLN SAPPHIRE 2.0X15 (BALLOONS) ×2
BALLN SAPPHIRE 2.5X15 (BALLOONS) ×2
BALLN SAPPHIRE ~~LOC~~ 3.75X15 (BALLOONS) ×2 IMPLANT
BALLOON SAPPHIRE 2.0X15 (BALLOONS) ×1 IMPLANT
BALLOON SAPPHIRE 2.5X15 (BALLOONS) ×1 IMPLANT
CATH OPTITORQUE TIG 4.0 5F (CATHETERS) ×2 IMPLANT
CATH VISTA GUIDE 6FR XBLAD3.5 (CATHETERS) ×2 IMPLANT
DEVICE RAD COMP TR BAND LRG (VASCULAR PRODUCTS) ×2 IMPLANT
GLIDESHEATH SLEND SS 6F .021 (SHEATH) ×2 IMPLANT
GUIDEWIRE INQWIRE 1.5J.035X260 (WIRE) ×1 IMPLANT
INQWIRE 1.5J .035X260CM (WIRE) ×2
KIT ENCORE 26 ADVANTAGE (KITS) ×2 IMPLANT
KIT HEART LEFT (KITS) ×2 IMPLANT
PACK CARDIAC CATHETERIZATION (CUSTOM PROCEDURE TRAY) ×2 IMPLANT
STENT SYNERGY DES 3.5X20 (Permanent Stent) ×2 IMPLANT
TRANSDUCER W/STOPCOCK (MISCELLANEOUS) ×2 IMPLANT
TUBING CIL FLEX 10 FLL-RA (TUBING) ×2 IMPLANT
WIRE PT2 MS 185 (WIRE) ×2 IMPLANT

## 2018-08-08 NOTE — Plan of Care (Signed)

## 2018-08-08 NOTE — H&P (Addendum)
Cardiology Admission History and Physical:   Patient ID: Lynn MoritaLinda Hogan MRN: [id number]; DOB: 08-Apr-1946   Admission date: 08/08/2018  Primary Care Provider: Si GaulGriffin, Jenny M, DO Primary Cardiologist: Dr Tresa EndoKelly Primary Electrophysiologist:  None   Chief Complaint:  Chest pain  Patient Profile:   Lynn Hogan is a [age] y.o. female with a history of recent NSTEMI and PCI, re admitted with STEMI.    History of Present Illness:   [name] is a previously healthy woman who presented 07/31/2018 with chest pain. She was in her usual state of health until 1/31 afternoon when she developed a burning sensation in her chest when she was resting at home.  While in the ER, her troponins were found to be positive. She was admitted and placed on Heparin, ASA, statin Rx and beta blocker.  Her Troponin peaked at 0.67. Cath was done 08/02/2018 and revealed significant two-vessel coronary artery disease with 50-60% mid LAD, 80-90% ostial D1, and sequential 70% and 90% proximal/mid RCA stenoses.  She underwent successful PCI to proximal/mid RCA using Synergy 3.0 x 38 mm DES with 0% residual stenosis.  The plan was for medical therapy of residual LAD/diagonal disease.  If [name] had recurrent angina, PCI to D1 could be considered (though risk is increased by true ostium involvement and potential for affecting the proximal LAD.  She was discharged 08/03/2018 and did well until this AM when she had sudden onset of chest pain. She took NTG and asa and EMS was called.  EMS EKG showed AL stemi.  Code stemi activated.  The pt was taken urgently to the cath lab by Dr Tresa EndoKelly.    Past Medical History:  Diagnosis Date  . Coronary artery disease   . Hyperlipidemia     Past Surgical History:  Procedure Laterality Date  . CARDIAC CATHETERIZATION    . CATARACT EXTRACTION, BILATERAL  02/12/2016  . CORONARY STENT INTERVENTION  08/02/2018  . CORONARY STENT INTERVENTION N/A 08/02/2018   Procedure: CORONARY STENT INTERVENTION;   Surgeon: Yvonne KendallEnd, Christopher, MD;  [facility]: [facility];  Service: Cardiovascular;  Laterality: N/A;  . HYSTERECTOMY ABDOMINAL WITH SALPINGECTOMY  02/12/1971  . LEFT HEART CATH AND CORONARY ANGIOGRAPHY N/A 08/02/2018   Procedure: LEFT HEART CATH AND CORONARY ANGIOGRAPHY;  Surgeon: Yvonne KendallEnd, Christopher, MD;  Location: [facility];  Service: Cardiovascular;  Laterality: N/A;     Medications Prior to Admission: Prior to Admission medications   Medication Sig Start Date End Date Taking? Authorizing Provider  amLODipine (NORVASC) 5 MG tablet Take 1 tablet (5 mg total) by mouth daily. 08/03/18 08/03/19  Manson PasseyBhagat, Bhavinkumar, PA  aspirin EC 81 MG EC tablet Take 1 tablet (81 mg total) by mouth daily. 08/03/18   Manson PasseyBhagat, Bhavinkumar, PA  atorvastatin (LIPITOR) 80 MG tablet Take 1 tablet (80 mg total) by mouth daily at 6 PM. 08/03/18   Bhagat, [place]Bhavinkumar, PA  clopidogrel (PLAVIX) 75 MG tablet Take 1 tablet (75 mg total) by mouth daily with breakfast. 08/03/18   Bhagat, [place]Bhavinkumar, PA  Glucosamine-Chondroitin (GLUCOSAMINE CHONDR COMPLEX PO) Take 1 tablet by mouth 2 (two) times daily with a meal.     [provider]  metoprolol tartrate (LOPRESSOR) 25 MG tablet Take 1 tablet (25 mg total) by mouth 2 (two) times daily. 08/03/18   Bhagat, Sharrell KuBhavinkumar, PA  nitroGLYCERIN (NITROSTAT) 0.4 MG SL tablet Place 1 tablet (0.4 mg total) under the tongue every 5 (five) minutes x 3 doses as needed for chest pain. 08/03/18   Bhagat,  Bhavinkumar, PA     Allergies:    Allergies  Allergen Reactions  . Ibuprofen Palpitations  . Nsaids Palpitations    Patient stated she has not tried all NSAIDS to see if they give her palpitations, but she is not willing to try anything else- just in case  . Banana Nausea Only    Social History:   Social History   Socioeconomic History  . Marital status: Single    Spouse name: Not on file  . Number of children: Not on file  . Years of education: Not on file  . Highest education  level: Not on file  Occupational History  . Not on file  Social Needs  . Financial resource strain: Not hard at all  . Food insecurity:    Worry: Patient refused    Inability: Patient refused  . Transportation needs:    Medical: Patient refused    Non-medical: Patient refused  Tobacco Use  . Smoking status: Never Smoker  . Smokeless tobacco: Never Used  Substance and Sexual Activity  . Alcohol use: Never    Frequency: Never  . Drug use: Never  . Sexual activity: Not Currently  Lifestyle  . Physical activity:    Days per week: 7 days    Minutes per session: 30 min  . Stress: Only a little  Relationships  . Social connections:    Talks on phone: Patient refused    Gets together: Patient refused    Attends religious service: Patient refused    Active member of club or organization: Patient refused    Attends meetings of clubs or organizations: Patient refused    Relationship status: Patient refused  . Intimate partner violence:    Fear of current or ex partner: Patient refused    Emotionally abused: Patient refused    Physically abused: Patient refused    Forced sexual activity: Patient refused  Other Topics Concern  . Not on file  Social History Narrative  . Not on file    Family History:   The patient's family history includes Brain cancer in her mother; Heart attack in her father; Lung cancer in her mother; Microcephaly in her mother.    ROS:  Please see the history of present illness.  All other ROS reviewed and negative.     Physical Exam/Data:   Vitals:   08/08/18 1122 08/08/18 1127 08/08/18 1130 08/08/18 1135  BP: 121/75 130/79 119/74 134/78  Pulse: 65 61 61 96  Resp: 15 12 12 11   Temp:      TempSrc:      SpO2: 97% 96% 95% 98%   No intake or output data in the 24 hours ending 08/08/18 1231 Last 3 Weights 08/03/2018 07/31/2018 07/31/2018  Weight (lbs) 164 lb 14.5 oz 162 lb 3.2 oz 161 lb  Weight (kg) 74.8 kg 73.573 kg 73.029 kg     There is no height or  weight on file to calculate BMI.  General:  Well nourished, well developed, in no acute distress HEENT: normal Lymph: no adenopathy Neck: no JVD Endocrine:  No thryomegaly Vascular: No carotid bruits; FA pulses 2+ bilaterally without bruits  Cardiac:  normal S1, S2; RRR; no murmur  Lungs:  clear to auscultation bilaterally, no wheezing, rhonchi or rales  Abd: soft, nontender, no hepatomegaly  Ext: no edema Musculoskeletal:  No deformities, BUE and BLE strength normal and equal Skin: warm and dry  Neuro:  CNs 2-12 intact, no focal abnormalities noted Psych:  Normal affect  EKG:  The ECG that was done 08/08/2018 was personally reviewed and demonstrates NSR, inferior Qs, AL ST elevation, Qs V3-V4  Relevant CV Studies: Echo 08/01/2018- IMPRESSIONS  1. The left ventricle has normal systolic function of 60-65%. The cavity size is normal. There is normal left ventricular wall thickness. Echo evidence of normal diastolic filling patterns.  2. Mildly dilated left atrial size.  3. Normal right atrial size.  4. The mitral valve normal in structure. Regurgitation is mild to moderate by color flow Doppler.  5. Aortic valve sclerosis without stenosis.  6. The aortic root and ascending aortaare normal is size and structure.  7. No atrial level shunt detected by color flow Doppler.  Laboratory Data:  Chemistry Recent Labs  Lab 08/03/18 0530 08/08/18 1014 08/08/18 1015  NA 141  --  135  K 3.6  --  3.8  CL 110  --  101  CO2 23  --  23  GLUCOSE 103*  --  172*  BUN 11  --  9  CREATININE 0.62 0.50 0.61  CALCIUM 9.0  --  9.2  GFRNONAA >60  --  >60  GFRAA >60  --  >60  ANIONGAP 8  --  11    Recent Labs  Lab 08/08/18 1015  PROT 7.0  ALBUMIN 3.5  AST 21  ALT 14  ALKPHOS 57  BILITOT 0.7   Hematology Recent Labs  Lab 08/03/18 0530 08/08/18 1015  WBC 8.9 9.9  RBC 3.50* 3.58*  HGB 11.1* 11.5*  HCT 34.6* 35.7*  MCV 98.9 99.7  MCH 31.7 32.1  MCHC 32.1 32.2  RDW 13.9 12.8    PLT 359 454*   Cardiac Enzymes Recent Labs  Lab 08/08/18 1015  TROPONINI 0.30*   No results for input(s): TROPIPOC in the last 168 hours.  BNPNo results for input(s): BNP, PROBNP in the last 168 hours.  DDimer No results for input(s): DDIMER in the last 168 hours.  Radiology/Studies:  Dg Chest Port 1 View  Result Date: 08/08/2018 CLINICAL DATA:  Chest pain since this morning. Recent STEMI and stent. EXAM: PORTABLE CHEST 1 VIEW COMPARISON:  07/31/2018 FINDINGS: The heart is mildly enlarged but stable given the AP projection and portable technique. The lungs are clear. No pleural effusion. The bony thorax is intact. IMPRESSION: Mild stable cardiac enlargement but no acute pulmonary findings. Electronically Signed   By: Rudie Meyer M.D.   On: 08/08/2018 10:24    Assessment and Plan:   STEMI- Pt presents today with AL STEMI  CAD- Recent RCA PCI with DES in the setting of NSTEMI  08/02/2018.  FM Hx CAD- F had an MI at age 27  Dyslipidemia- LDL 194 on admission 07/31/2018- now down to 71 on Statin Rx  Plan: Per dr Tresa Endo- urgent cath  Severity of Illness: The appropriate patient status for this patient is INPATIENT. Inpatient status is judged to be reasonable and necessary in order to provide the required intensity of service to ensure the patient's safety. The patient's presenting symptoms, physical exam findings, and initial radiographic and laboratory data in the context of their chronic comorbidities is felt to place them at high risk for further clinical deterioration. Furthermore, it is not anticipated that the patient will be medically stable for discharge from the hospital within 2 midnights of admission. The following factors support the patient status of inpatient.   " The patient's presenting symptoms include chest pain. " The worrisome physical exam findings include none. " The initial radiographic and  laboratory data are worrisome because of STEMI. " The chronic  co-morbidities include CAD.   * I certify that at the point of admission it is my clinical judgment that the patient will require inpatient hospital care spanning beyond 2 midnights from the point of admission due to high intensity of service, high risk for further deterioration and high frequency of surveillance required.*    For questions or updates, please contact [facility] Please consult [email or web address] for contact info under      Signed, Corine Shelter, PA-C  08/08/2018 12:31 PM    Patient seen and examined. Agree with assessment and plan.  Ms. Dannika Hilgeman is a 73 year old female who was recently hospitalized over the past week following a non-ST segment elevation MI.  She had undergone cardiac catheterization by Dr. Okey Dupre and underwent cardiac catheterization on August 02, 2018.  This revealed a high-grade segmental RCA disease which was felt to be the culprit vessel for which she underwent successful stenting.  She was also found to have a very high diagonal's (almost ramus intermediate like distribution) 85% ostial stenosis at the takeoff of the very proximal LAD.  There also was a 55% mid LAD stenosis.  The patient underwent successful stenting of her RCA with a Synergy 3.0 x 38 mm stent with 0% residual stenosis.  She was sent home on aspirin and Plavix in addition to amlodipine, metoprolol, and atorvastatin 80 mg.  Apparently, today she experienced severe substernal chest pressure.  She called EMS.  A code STEMI was activated by EMS and initial ECG showed significant anterior acute injury current which was new.  Upon presentation to the emergency room her chest pain had improved.  She had received heparin therapy but still had residual chest discomfort.  Her ST segment elevation anteriorly had improved.  She is taken urgently to the cardiac catheterization laboratory for emergent cardiac catheterization and probable PCI involving the distribution of the LAD.   [name], MD,  [facility] 08/08/2018 1:34 PM

## 2018-08-08 NOTE — ED Provider Notes (Signed)
[name] EMERGENCY DEPARTMENT Provider Note   CSN: [id number] Arrival date & time: 08/08/18  0957     History   Chief Complaint Chief Complaint  Patient presents with  . Code STEMI    HPI Lynn Hogan is a 73 y.o. female. Pt has known history of recent MI. She was admitted on 2/1 for a nstemi.  Pt had cardiac stenting.  Pt was doing well since discharge when she had sudden onset of pain this am.  She took NTG and asa.  EMS EKG showed stemi.  Code stemi activated.  EKG in the field showed anterolateral st elevation The history is provided by the patient.  Chest Pain  Pain location:  Substernal area Pain quality: dull and pressure   Pain radiates to:  Does not radiate Pain severity:  Severe Onset quality:  Sudden Duration:  1 hour Timing:  Constant Chronicity:  Recurrent Relieved by:  Nothing   Past Medical History:  Diagnosis Date  . Coronary artery disease   . Hyperlipidemia     Patient Active Problem List   Diagnosis Date Noted  . NSTEMI (non-ST elevated myocardial infarction) (HCC) 07/31/2018  . Hyperlipidemia 07/31/2018    Past Surgical History:  Procedure Laterality Date  . CARDIAC CATHETERIZATION    . CATARACT EXTRACTION, BILATERAL  02/12/2016  . CORONARY STENT INTERVENTION  08/02/2018  . CORONARY STENT INTERVENTION N/A 08/02/2018   Procedure: CORONARY STENT INTERVENTION;  Surgeon: Yvonne Kendall, MD;  Location: [facility];  Service: Cardiovascular;  Laterality: N/A;  . HYSTERECTOMY ABDOMINAL WITH SALPINGECTOMY  02/12/1971  . LEFT HEART CATH AND CORONARY ANGIOGRAPHY N/A 08/02/2018   Procedure: LEFT HEART CATH AND CORONARY ANGIOGRAPHY;  Surgeon: Yvonne Kendall, MD;  Location: [facility];  Service: Cardiovascular;  Laterality: N/A;     OB History   No obstetric history on file.      Home Medications    Prior to Admission medications   Medication Sig Start Date End Date Taking? Authorizing Provider  amLODipine  (NORVASC) 5 MG tablet Take 1 tablet (5 mg total) by mouth daily. 08/03/18 08/03/19  Manson Passey, PA  aspirin EC 81 MG EC tablet Take 1 tablet (81 mg total) by mouth daily. 08/03/18   Manson Passey, PA  atorvastatin (LIPITOR) 80 MG tablet Take 1 tablet (80 mg total) by mouth daily at 6 PM. 08/03/18   Bhagat, [place], PA  clopidogrel (PLAVIX) 75 MG tablet Take 1 tablet (75 mg total) by mouth daily with breakfast. 08/03/18   Bhagat, [place], PA  Glucosamine-Chondroitin (GLUCOSAMINE CHONDR COMPLEX PO) Take 1 tablet by mouth 2 (two) times daily with a meal.     [provider]  metoprolol tartrate (LOPRESSOR) 25 MG tablet Take 1 tablet (25 mg total) by mouth 2 (two) times daily. 08/03/18   Bhagat, Sharrell Ku, PA  nitroGLYCERIN (NITROSTAT) 0.4 MG SL tablet Place 1 tablet (0.4 mg total) under the tongue every 5 (five) minutes x 3 doses as needed for chest pain. 08/03/18   Manson Passey, PA    Family History Family History  Problem Relation Age of Onset  . Microcephaly Mother   . Brain cancer Mother   . Lung cancer Mother   . Heart attack Father     Social History Social History   Tobacco Use  . Smoking status: Never Smoker  . Smokeless tobacco: Never Used  Substance Use Topics  . Alcohol use: Never    Frequency: Never  . Drug use: Never  Allergies   Ibuprofen; Nsaids; and Banana   Review of Systems Review of Systems  Cardiovascular: Positive for chest pain.  All other systems reviewed and are negative.    Physical Exam Updated Vital Signs BP 126/74   Pulse 65   Temp 98.6 F (37 C) (Temporal)   Resp 14   SpO2 100%   Physical Exam Vitals signs and nursing note reviewed.  Constitutional:      General: She is not in acute distress.    Appearance: She is well-developed.  HENT:     Head: Normocephalic and atraumatic.     Right Ear: External ear normal.     Left Ear: External ear normal.  Eyes:     General: No scleral icterus.       Right  eye: No discharge.        Left eye: No discharge.     Conjunctiva/sclera: Conjunctivae normal.  Neck:     Musculoskeletal: Neck supple.     Trachea: No tracheal deviation.  Cardiovascular:     Rate and Rhythm: Normal rate and regular rhythm.  Pulmonary:     Effort: Pulmonary effort is normal. No respiratory distress.     Breath sounds: Normal breath sounds. No stridor. No wheezing or rales.  Abdominal:     General: Bowel sounds are normal. There is no distension.     Palpations: Abdomen is soft.     Tenderness: There is no abdominal tenderness. There is no guarding or rebound.  Musculoskeletal:        General: No tenderness.  Skin:    General: Skin is warm and dry.     Findings: No rash.  Neurological:     Mental Status: She is alert.     Cranial Nerves: No cranial nerve deficit (no facial droop, extraocular movements intact, no slurred speech).     Sensory: No sensory deficit.     Motor: No abnormal muscle tone or seizure activity.     Coordination: Coordination normal.      ED Treatments / Results  Labs (all labs ordered are listed, but only abnormal results are displayed) Labs Reviewed  CBC WITH DIFFERENTIAL/PLATELET  PROTIME-INR  APTT  COMPREHENSIVE METABOLIC PANEL  TROPONIN I  LIPID PANEL  COMPREHENSIVE METABOLIC PANEL  LIPID PANEL  HEMOGLOBIN A1C  CBC  PROTIME-INR  APTT  TROPONIN I  I-STAT CREATININE, ED    EKG EKG Interpretation  Date/Time:  Sunday August 08 2018 10:13:26 EST Ventricular Rate:  64 PR Interval:    QRS Duration: 89 QT Interval:  421 QTC Calculation: 435 R Axis:   -45 Text Interpretation:  Sinus rhythm Inferior infarct, old Anterior infarct, old Lateral leads are also involved peaked t waves and st elevation new since prior tracing Confirmed by Linwood DibblesKnapp, Ferry Matthis [phone]([id number]) on 08/08/2018 10:24:38 AM   Radiology Dg Chest Port 1 View  Result Date: 08/08/2018 CLINICAL DATA:  Chest pain since this morning. Recent STEMI and stent. EXAM: PORTABLE  CHEST 1 VIEW COMPARISON:  07/31/2018 FINDINGS: The heart is mildly enlarged but stable given the AP projection and portable technique. The lungs are clear. No pleural effusion. The bony thorax is intact. IMPRESSION: Mild stable cardiac enlargement but no acute pulmonary findings. Electronically Signed   By: Rudie MeyerP.  Gallerani M.D.   On: 08/08/2018 10:24    Procedures .Critical Care Performed by: Linwood DibblesKnapp, Derryl Uher, MD Authorized by: Linwood DibblesKnapp, Ajeenah Heiny, MD   Critical care provider statement:    Critical care time (minutes):  45   Critical  care was time spent personally by me on the following activities:  Discussions with consultants, evaluation of patient's response to treatment, examination of patient, ordering and performing treatments and interventions, ordering and review of laboratory studies, ordering and review of radiographic studies, pulse oximetry, re-evaluation of patient's condition, obtaining history from patient or surrogate and review of old charts   (including critical care time)  Medications Ordered in ED Medications  0.9 %  sodium chloride infusion (20 mLs Intravenous New Bag/Given 08/08/18 1013)  heparin 5000 UNIT/ML injection (has no administration in time range)  midazolam (VERSED) injection (1 mg Intravenous Given 08/08/18 1047)  fentaNYL (SUBLIMAZE) injection (25 mcg Intravenous Given 08/08/18 1047)  Heparin (Porcine) in NaCl 1000-0.9 UT/500ML-% SOLN (500 mLs  Given 08/08/18 1047)  lidocaine (PF) (XYLOCAINE) 1 % injection (2 mLs  Given 08/08/18 1047)  Radial Cocktail/Verapamil only ( Intra-arterial Given 08/08/18 1047)  heparin injection 4,000 Units (4,000 Units Intravenous Given 08/08/18 1009)  morphine 4 MG/ML injection 4 mg (4 mg Intravenous Given 08/08/18 1013)     Initial Impression / Assessment and Plan / ED Course  I have reviewed the triage vital signs and the nursing notes.  Pertinent labs & imaging results that were available during my care of the patient were reviewed by me and considered  in my medical decision making (see chart for details).  Clinical Course as of Aug 08 1056  Sun Aug 08, 2018  1004 Pt took at ASA at home   [JK]  1016 Pain decreased to a 5 after morphine. Cardiology is aware that pt is here.  Repeat EKG improved   [JK]    Clinical Course User Index [JK] Linwood Dibbles, MD    Patient presented to the emergency room with acute chest pain after recent admission to the hospital for non-ST elevation myocardial infarction.  Patient was treated with coronary stents.  Patient had acute onset of chest pain today.  EMS EKG was consistent with ST elevation MI.  In the ED the patient was started on heparin.  Repeat EKG does show some improvement.  Patient likely has an active clot causing acute ST elevation MI.  Cath Lab was activated in the field.  Patient remained stable during ED stay.  Final Clinical Impressions(s) / ED Diagnoses   Final diagnoses:  Acute ST elevation myocardial infarction (STEMI), unspecified artery (HCC)      Linwood Dibbles, MD 08/08/18 1058

## 2018-08-08 NOTE — ED Triage Notes (Signed)
Pt here from home as a stemi , pt had a stemi on Monday and got one stent , pt received 4 morphine and  324mg   asa

## 2018-08-08 NOTE — Progress Notes (Signed)
Pt arrived to unit from cath lab on ICU bed. Pt alert and oriented, on room air. Pt able to follow command, moves all extremities independently. Pt placed on ICU monitors. Call bell in reach. Family at bedside.

## 2018-08-09 ENCOUNTER — Encounter (HOSPITAL_COMMUNITY): Payer: Self-pay | Admitting: *Deleted

## 2018-08-09 LAB — BASIC METABOLIC PANEL
Anion gap: 14 (ref 5–15)
BUN: 5 mg/dL — ABNORMAL LOW (ref 8–23)
CO2: 22 mmol/L (ref 22–32)
Calcium: 9 mg/dL (ref 8.9–10.3)
Chloride: 102 mmol/L (ref 98–111)
Creatinine, Ser: 0.61 mg/dL (ref 0.44–1.00)
GFR calc Af Amer: >60 mL/min (ref >60)
GFR calc non Af Amer: >60 mL/min (ref >60)
Glucose, Bld: 124 mg/dL — ABNORMAL HIGH (ref 70–99)
Potassium: 3.6 mmol/L (ref 3.5–5.1)
Sodium: 138 mmol/L (ref 135–145)

## 2018-08-09 LAB — CBC
HCT: 33.6 % — ABNORMAL LOW (ref 36.0–46.0)
Hemoglobin: 10.5 g/dL — ABNORMAL LOW (ref 12.0–15.0)
MCH: 30.1 pg (ref 26.0–34.0)
MCHC: 31.3 g/dL (ref 30.0–36.0)
MCV: 96.3 fL (ref 80.0–100.0)
NRBC: 0 % (ref 0.0–0.2)
Platelets: 361 10*3/uL (ref 150–400)
RBC: 3.49 MIL/uL — ABNORMAL LOW (ref 3.87–5.11)
RDW: 12.6 % (ref 11.5–15.5)
WBC: 8.3 10*3/uL (ref 4.0–10.5)

## 2018-08-09 NOTE — Care Management Note (Signed)
Case Management Note  Patient Details  Name: Lynn Hogan MRN: [id number] Date of Birth: 29-Oct-1945  Subjective/Objective:  73 yo female presented with STEMI; s/p cath with stent placement.                   Action/Plan: CM met with patient to discuss transitional needs. Patient states living at home and being independent with her ADLs PTA. PCP: Dr. Doreatha Lew; pharmacy of choice[facility] [place]. Brilinta benefits check is complete with est monthly cost $45.00; CM discussed copay cost and [facility] pharmacy service, with patient agreeable to use TOC for her Rx needs prior to transitioning home. Brilinta card provided. No further needs from CM.   Expected Discharge Date:                  Expected Discharge Plan:  Home/Self Care  In-House Referral:  NA  Discharge planning Services  CM Consult, Medication Assistance(Brilinta benefits check)  Post Acute Care Choice:  NA Choice offered to:  NA  DME Arranged:  N/A DME Agency:  NA  HH Arranged:  NA HH Agency:  NA  Status of Service:  In process, will continue to follow  If discussed at Long Length of Stay Meetings, dates discussed:    Additional Comments:  Midge Minium RN, BSN, NCM-BC, ACM-RN [phone] 08/09/2018, 4:12 PM

## 2018-08-09 NOTE — Care Management (Signed)
#    3.  S/W   JOSEPH @ [facility] # [phone]  TICAGRELOR: NONE FORMULARY  BRILINTA   90 MG BID   COVER- YES CO-PAY- $ 45.00 TIER- 3 DRUG PRIOR APPROVAL- NPO  PREFERRED PHARMACY : YES [facility]

## 2018-08-09 NOTE — Progress Notes (Addendum)
Progress Note  Patient Name: Lynn MoritaLinda Burdett Date of Encounter: 08/09/2018  Primary Cardiologist: No primary care provider on file.   Subjective   Patient is status post left heart cath yesterday with placement of a drug-eluting stent to the proximal LAD. She feels much better this morning. She denies chest pressure or shortness of breath. She is asking for Tylenol and states that her right arm is achy from where they waited for the catheterization. Her family is wondering if she is good to go back for another procedure. They express concerns that if we do not go and intervene she may continue to have symptoms. We discussed medical management.  Inpatient Medications    Scheduled Meds: . amLODipine  5 mg Oral Daily  . aspirin  81 mg Oral Daily  . aspirin EC  81 mg Oral Daily  . atorvastatin  80 mg Oral q1800  . metoprolol tartrate  25 mg Oral BID  . sodium chloride flush  3 mL Intravenous Q12H  . ticagrelor  90 mg Oral BID   Continuous Infusions: . sodium chloride Stopped (08/08/18 2138)  . sodium chloride     PRN Meds: sodium chloride, acetaminophen, diazepam, nitroGLYCERIN, ondansetron (ZOFRAN) IV, sodium chloride flush   Vital Signs    Vitals:   08/09/18 0500 08/09/18 0600 08/09/18 0700 08/09/18 0800  BP: 126/67 121/85 126/60 127/72  Pulse: 63 67 78 80  Resp: 11 12 18 14   Temp:    98.1 F (36.7 C)  TempSrc:    Oral  SpO2: 94% 93% 97% 93%    Intake/Output Summary (Last 24 hours) at 08/09/2018 0853 Last data filed at 08/09/2018 0754 Gross per 24 hour  Intake 2172.73 ml  Output 3401 ml  Net -1228.27 ml   There were no vitals filed for this visit.  Telemetry    Sinus rhythm with occasional PVC and non-sustained VT. Longest episode four beats. - Personally Reviewed  ECG    Sinus rhythm with left axis deviation. Slight ST elevation leads V2-V4 with questionable Wellen's syndrome - Personally Reviewed  Physical Exam   Today's Vitals   08/09/18 0500 08/09/18 0600  08/09/18 0700 08/09/18 0800  BP: 126/67 121/85 126/60 127/72  Pulse: 63 67 78 80  Resp: 11 12 18 14   Temp:    98.1 F (36.7 C)  TempSrc:    Oral  SpO2: 94% 93% 97% 93%  PainSc:       There is no height or weight on file to calculate BMI.  GEN: No acute distress.   Neck: No JVD Cardiac: RRR, faint systolic crescendo-decrescendo murmur.  Respiratory: Clear to auscultation bilaterally. GI: Soft, nontender, non-distended  MS: No edema; No deformity. Right arm cath site without hematoma but does have some soft ecchymosis  Neuro:  Nonfocal  Psych: Normal affect   Labs    Chemistry Recent Labs  Lab 08/03/18 0530  08/08/18 1015 08/08/18 1053 08/08/18 1105 08/09/18 0317  NA 141  --  135  --  135 138  K 3.6  --  3.8  --  3.8 3.6  CL 110  --  101  --   --  102  CO2 23  --  23  --   --  22  GLUCOSE 103*  --  172*  --   --  124*  BUN 11  --  9  --   --  5*  CREATININE 0.62   < > 0.61 0.40*  --  0.61  CALCIUM 9.0  --  9.2  --   --  9.0  PROT  --   --  7.0  --   --   --   ALBUMIN  --   --  3.5  --   --   --   AST  --   --  21  --   --   --   ALT  --   --  14  --   --   --   ALKPHOS  --   --  57  --   --   --   BILITOT  --   --  0.7  --   --   --   GFRNONAA >60  --  >60  --   --  >60  GFRAA >60  --  >60  --   --  >60  ANIONGAP 8  --  11  --   --  14   < > = values in this interval not displayed.    Hematology Recent Labs  Lab 08/03/18 0530 08/08/18 1015 08/08/18 1105 08/09/18 0317  WBC 8.9 9.9  --  8.3  RBC 3.50* 3.58*  --  3.49*  HGB 11.1* 11.5* 11.6* 10.5*  HCT 34.6* 35.7* 34.0* 33.6*  MCV 98.9 99.7  --  96.3  MCH 31.7 32.1  --  30.1  MCHC 32.1 32.2  --  31.3  RDW 13.9 12.8  --  12.6  PLT 359 454*  --  361   Cardiac Enzymes Recent Labs  Lab 08/08/18 1015 08/08/18 1423  TROPONINI 0.30* 17.14*   No results for input(s): TROPIPOC in the last 168 hours.   BNPNo results for input(s): BNP, PROBNP in the last 168 hours.   DDimer No results for input(s): DDIMER  in the last 168 hours.   Radiology    Dg Chest Port 1 View  Result Date: 08/08/2018 CLINICAL DATA:  Chest pain since this morning. Recent STEMI and stent. EXAM: PORTABLE CHEST 1 VIEW COMPARISON:  07/31/2018 FINDINGS: The heart is mildly enlarged but stable given the AP projection and portable technique. The lungs are clear. No pleural effusion. The bony thorax is intact. IMPRESSION: Mild stable cardiac enlargement but no acute pulmonary findings. Electronically Signed   By: Rudie MeyerP.  Gallerani M.D.   On: 08/08/2018 10:24   Cardiac Studies   Left Heart Cath 08/08/2018  Ost Ramus lesion is 85% stenosed.  Prox LAD to Mid LAD lesion is 99% stenosed.  Previously placed Prox RCA to Mid RCA stent (unknown type) is widely patent.  Post intervention, there is a 0% residual stenosis.  A stent was successfully placed.  Patient Profile     73 y.o. female with HLD recently admitted on 2/3 for chest pain with DES placement to the RCA who was readmitted on 2/9 with a STEMI and subsequently underwent PCI with DES placement to the proximal LAD.   Assessment & Plan    STEMI  CAD s/p 2 DES (RCA and proximal LAD) - Tolerated left heart cath with DES to the proximal LAD on 2/9 well without apparent complications. - No further chest pain or shortness of breath - Right radial cath site looks well without hematoma - Renal function stable  - Follow-up echocardiogram  - Continue DAPT with Ticagrelor and ASA - Continue Metoprolol tartrate 25 mg BID  - Would recommend stopping amlodipine and switching to ACE prior to discharge  - Continue Atorvastatin 80mg  QD  - Consider Colchicine 0.5 mg QD - Will discuss with  Dr. Eldridge Dace whether patient needs further PCI for ostial diagonal/ramus lesion   For questions or updates, please contact [facility] Please consult [email or web address] for contact info under Cardiology/STEMI.     Signed, Levora Dredge, MD  08/09/2018, 8:53 AM    I have examined the patient and  reviewed assessment and plan and discussed with patient.  Agree with above as stated.  I personally reviewed the cath films.  I discussed the findings with the patient and family.  There is a residual, ostial lesion of a diagonal branch.  I suspect this is stable coronary disease.  She did not have symptoms from February 3 after her PCI until the morning of February 9.  The symptoms she had on February 9 were from her severe LAD disease which was acute.  I would favor medical therapy at this point.  The location of the lesion in the ostial diagonal may be treatable percutaneously.  A cutting balloon may be reasonable strategy, however if there is a dissection and a stent needed to be placed, I would not want to take the risk in terms of compromising the LAD.  We will move her to telemetry.  Let her ambulate in the halls.  If she has no angina, would continue with plans for medical therapy.  If she did have angina, could bring her back to the Cath Lab for PCI of the ostial diagonal.  Family is in agreement.  Await echo result.  She will likely need ACE inhibitor added.  Will stop amlodipine at this point.  Lance Muss

## 2018-08-09 NOTE — Care Management (Signed)
Brilinta benefits check sent and pending.  Avangelina Flight RN, BSN, NCM-BC, ACM-RN [phone] 

## 2018-08-09 NOTE — Progress Notes (Signed)
CARDIAC REHAB PHASE I   PRE:  Rate/Rhythm: 62 SR  BP:  Supine:   Sitting: 104/56  Standing:    SaO2: 98%RA  MODE:  Ambulation: 290 ft   POST:  Rate/Rhythm: 80 SR  BP:  Supine:   Sitting: 109/69  Standing:    SaO2: 95%RA 1120-1142 Pt walked 290 ft on RA with fairly steady gait. A little wobbly and c/o legs weak. I just saw pt a few days ago and she remebered ed. Reviewed brilinta use since pt was on plavix before. Stressed importance with stent. Will send update letter to [place] CRP 2. To bed after walk. No CP.   Luetta Nutting, RN BSN  08/09/2018 11:40 AM

## 2018-08-10 ENCOUNTER — Inpatient Hospital Stay (HOSPITAL_COMMUNITY): Payer: Medicare HMO

## 2018-08-10 ENCOUNTER — Encounter (HOSPITAL_COMMUNITY): Payer: Self-pay

## 2018-08-10 MED ORDER — LISINOPRIL 5 MG PO TABS
5.0000 mg | ORAL_TABLET | Freq: Once | ORAL | Status: AC
  Administered 2018-08-10: 5 mg via ORAL
  Filled 2018-08-10: qty 1

## 2018-08-10 NOTE — Progress Notes (Addendum)
Progress Note  Patient Name: Lynn Hogan Date of Encounter: 08/10/2018  Primary Cardiologist: No primary care provider on file.   Subjective   Patient feeling well this AM. She is having some irritation around the right AC IV. Able to ambulate twice yesterday without CP or SOB. She overall feels well. Asking when she can leave the hospital. Discussed that we are awaiting an echo and will continue to make medication changes. All questions and concerns addressed.   Inpatient Medications    Scheduled Meds: . aspirin  81 mg Oral Daily  . aspirin EC  81 mg Oral Daily  . atorvastatin  80 mg Oral q1800  . metoprolol tartrate  25 mg Oral BID  . sodium chloride flush  3 mL Intravenous Q12H  . ticagrelor  90 mg Oral BID   Continuous Infusions: . sodium chloride Stopped (08/08/18 2138)  . sodium chloride     PRN Meds: sodium chloride, acetaminophen, diazepam, nitroGLYCERIN, ondansetron (ZOFRAN) IV, sodium chloride flush   Vital Signs    Vitals:   08/09/18 2000 08/09/18 2100 08/10/18 0000 08/10/18 0400  BP: (!) 111/55 120/77 (!) 108/53 (!) 112/54  Pulse: 86 84 64 (!) 59  Resp: 20     Temp: 97.8 F (36.6 C)  97.9 F (36.6 C) 97.6 F (36.4 C)  TempSrc: Oral  Oral Oral  SpO2: 94% 96% 93% 94%    Intake/Output Summary (Last 24 hours) at 08/10/2018 0758 Last data filed at 08/10/2018 0400 Gross per 24 hour  Intake 723 ml  Output -  Net 723 ml   There were no vitals filed for this visit.  Telemetry    Sinus rhythm with occasional PVC. Some sinus bradycardia while sleeping. - Personally Reviewed  ECG    No new EKG - Personally Reviewed  Physical Exam   Today's Vitals   08/09/18 2100 08/09/18 2244 08/10/18 0000 08/10/18 0400  BP: 120/77  (!) 108/53 (!) 112/54  Pulse: 84  64 (!) 59  Resp:      Temp:   97.9 F (36.6 C) 97.6 F (36.4 C)  TempSrc:   Oral Oral  SpO2: 96%  93% 94%  PainSc:  Asleep 0-No pain Asleep   There is no height or weight on file to calculate  BMI.  GEN: No acute distress.   Neck: No JVD Cardiac: RRR, faint systolic crescendo-decrescendo murmur.  Respiratory: Clear to auscultation bilaterally. GI: Soft, nontender, non-distended  MS: No edema; No deformity. Right arm cath site without hematoma but does have some soft ecchymosis  Neuro:  Nonfocal  Psych: Normal affect   Labs    Chemistry Recent Labs  Lab 08/08/18 1015 08/08/18 1053 08/08/18 1105 08/09/18 0317  NA 135  --  135 138  K 3.8  --  3.8 3.6  CL 101  --   --  102  CO2 23  --   --  22  GLUCOSE 172*  --   --  124*  BUN 9  --   --  5*  CREATININE 0.61 0.40*  --  0.61  CALCIUM 9.2  --   --  9.0  PROT 7.0  --   --   --   ALBUMIN 3.5  --   --   --   AST 21  --   --   --   ALT 14  --   --   --   ALKPHOS 57  --   --   --   BILITOT 0.7  --   --   --  GFRNONAA >60  --   --  >60  GFRAA >60  --   --  >60  ANIONGAP 11  --   --  14    Hematology Recent Labs  Lab 08/08/18 1015 08/08/18 1105 08/09/18 0317  WBC 9.9  --  8.3  RBC 3.58*  --  3.49*  HGB 11.5* 11.6* 10.5*  HCT 35.7* 34.0* 33.6*  MCV 99.7  --  96.3  MCH 32.1  --  30.1  MCHC 32.2  --  31.3  RDW 12.8  --  12.6  PLT 454*  --  361   Cardiac Enzymes Recent Labs  Lab 08/08/18 1015 08/08/18 1423  TROPONINI 0.30* 17.14*   No results for input(s): TROPIPOC in the last 168 hours.   BNPNo results for input(s): BNP, PROBNP in the last 168 hours.   DDimer No results for input(s): DDIMER in the last 168 hours.   Radiology    Dg Chest Port 1 View  Result Date: 08/08/2018 CLINICAL DATA:  Chest pain since this morning. Recent STEMI and stent. EXAM: PORTABLE CHEST 1 VIEW COMPARISON:  07/31/2018 FINDINGS: The heart is mildly enlarged but stable given the AP projection and portable technique. The lungs are clear. No pleural effusion. The bony thorax is intact. IMPRESSION: Mild stable cardiac enlargement but no acute pulmonary findings. Electronically Signed   By: Rudie MeyerP.  Gallerani M.D.   On: 08/08/2018 10:24     Cardiac Studies   Left Heart Cath 08/08/2018  Ost Ramus lesion is 85% stenosed.  Prox LAD to Mid LAD lesion is 99% stenosed.  Previously placed Prox RCA to Mid RCA stent (unknown type) is widely patent.  Post intervention, there is a 0% residual stenosis.  A stent was successfully placed.  Patient Profile     73 y.o. female with HLD recently admitted on 2/3 for chest pain with DES placement to the RCA who was readmitted on 2/9 with a STEMI and subsequently underwent PCI with DES placement to the proximal LAD.   Assessment & Plan    STEMI  CAD s/p 2 DES (RCA and proximal LAD) - Able to ambulate yesterday without SOB or CP  - Renal function stable  - Awaiting echocardiogram  - Amlodipine stopped yesterday, will start low dose ACE prior to discharge  - Continue DAPT with Ticagrelor and ASA - Continue Metoprolol tartrate 25 mg BID  - Continue Atorvastatin 80mg  QD  - Possible discharge later today pending echocardiogram   For questions or updates, please contact [facility] Please consult [email or web address] for contact info under Cardiology/STEMI.     Signed, Levora DredgeJustin Helberg, MD  08/10/2018, 7:58 AM    I have examined the patient and reviewed assessment and plan and discussed with patient.  Agree with above as stated.    Discussed case with Dr. Tresa EndoKelly.  Given the lack of angina she is having even with walking, would continue medical therapy for diagonal disease.  PCI would have the risk of compromising the LAD, so would only undertake if she had refractory angina.   Will try to transfer to tele again today, and likely discharge tomorrow.   Lance MussJayadeep Rosevelt Luu

## 2018-08-10 NOTE — Progress Notes (Signed)
Report called to 6e RN.

## 2018-08-10 NOTE — Progress Notes (Signed)
VAST RN consulted to place PIV before pt transferred to floor. Called and spoke with unit RN. She confirmed pt is not receiving any IV meds or fluids at this time. VAST RN advised that pt does not need access if it is not being used currently d/t increased risk for infection. Also educated about vein preservation. Unit RN verbalized understanding.

## 2018-08-10 NOTE — Progress Notes (Signed)
0950-1010 Pt just walked 740 ft on RA with her daughter and no CP. In bed. Gave pt low sodium handouts and discussed 2000 mg restriction, daily weights, and signs of CHF in case ECHO shows low EF. Pt stated she did not think ECHO would be done until evening.  Will follow up tomorrow if not discharged. Luetta Nutting RN BSN 08/10/2018 10:12 AM

## 2018-08-10 NOTE — Progress Notes (Signed)
Pt c/o discomfort from iv site in right ac. Site benign. IV removed. Per IV team, pt does not need piv per protocol and floor RN can start piv if needed in the future.

## 2018-08-11 ENCOUNTER — Inpatient Hospital Stay (HOSPITAL_COMMUNITY): Payer: Medicare HMO

## 2018-08-11 ENCOUNTER — Ambulatory Visit: Payer: Medicare HMO | Admitting: Cardiology

## 2018-08-11 ENCOUNTER — Encounter (HOSPITAL_COMMUNITY): Payer: Self-pay | Admitting: Cardiology

## 2018-08-11 DIAGNOSIS — I2510 Atherosclerotic heart disease of native coronary artery without angina pectoris: Secondary | ICD-10-CM

## 2018-08-11 DIAGNOSIS — I5021 Acute systolic (congestive) heart failure: Secondary | ICD-10-CM

## 2018-08-11 DIAGNOSIS — I361 Nonrheumatic tricuspid (valve) insufficiency: Secondary | ICD-10-CM

## 2018-08-11 DIAGNOSIS — E782 Mixed hyperlipidemia: Secondary | ICD-10-CM

## 2018-08-11 LAB — BASIC METABOLIC PANEL
Anion gap: 5 (ref 5–15)
BUN: 10 mg/dL (ref 8–23)
CHLORIDE: 107 mmol/L (ref 98–111)
CO2: 27 mmol/L (ref 22–32)
Calcium: 9 mg/dL (ref 8.9–10.3)
Creatinine, Ser: 0.64 mg/dL (ref 0.44–1.00)
GFR calc Af Amer: >60 mL/min (ref >60)
GFR calc non Af Amer: >60 mL/min (ref >60)
Glucose, Bld: 109 mg/dL — ABNORMAL HIGH (ref 70–99)
POTASSIUM: 4.5 mmol/L (ref 3.5–5.1)
Sodium: 139 mmol/L (ref 135–145)

## 2018-08-11 LAB — ECHOCARDIOGRAM COMPLETE
Height: 60.984 in
Weight: 2512 oz

## 2018-08-11 MED ORDER — LISINOPRIL 5 MG PO TABS: 5.0000 mg | ORAL_TABLET | Freq: Every day | ORAL | 11 refills | Status: DC

## 2018-08-11 MED ORDER — TICAGRELOR 90 MG PO TABS: 90.0000 mg | ORAL_TABLET | Freq: Two times a day (BID) | ORAL | 11 refills | Status: DC

## 2018-08-11 MED ORDER — LISINOPRIL 5 MG PO TABS
5.0000 mg | ORAL_TABLET | Freq: Every day | ORAL | Status: DC
  Administered 2018-08-11: 5 mg via ORAL
  Filled 2018-08-11: qty 1

## 2018-08-11 MED FILL — LISINOPRIL 5 MG TABLET: 5 | 30 days supply | Qty: 30 | Fill #0 | Status: TO

## 2018-08-11 MED FILL — BRILINTA 90 MG TABLET: 90 | 30 days supply | Qty: 60 | Fill #0 | Status: TO

## 2018-08-11 NOTE — Progress Notes (Signed)
    Order form completed for [employer]. Rep contacted.  SignedLaverda Page, NP-C 08/11/2018, 1:46 PM Pager: [phone]

## 2018-08-11 NOTE — Discharge Summary (Addendum)
Discharge Summary    Patient ID: Lynn Hogan,  MRN: [id number], DOB/AGE: [birth year] 73 y.o.  Admit date: 08/08/2018 Discharge date: 08/11/2018  Primary Care Provider: Si Gaul Primary Cardiologist: Dr. Tresa Endo  Discharge Diagnoses    Active Problems:   STEMI involving left anterior descending coronary artery [facility])   Hyperlipidemia   STEMI (ST elevation myocardial infarction) [facility])   CAD (coronary artery disease)   Acute systolic heart failure (HCC)  Allergies Allergies  Allergen Reactions  . Ibuprofen Palpitations  . Nsaids Palpitations    Patient stated she has not tried all NSAIDS to see if they give her palpitations, but she is not willing to try anything else- just in case  . Banana Nausea Only    Diagnostic Studies/Procedures    Cath: 08/08/2018   Ost Ramus lesion is 85% stenosed.  Prox LAD to Mid LAD lesion is 99% stenosed.  Previously placed Prox RCA to Mid RCA stent (unknown type) is widely patent.  Post intervention, there is a 0% residual stenosis.  A stent was successfully placed.   Acute anterior ST segment elevation myocardial infarction secondary to subtotal occlusion of the mid LAD at site of previous 55% stenosis from catheterization in August 02, 2018.  85% ostial stenosis and a very proximal first diagonal vessel (ramus intermediate like vessel not significantly changed from the 08/02/2018 evaluation.  Normal left circumflex coronary artery.  Widely patent proximal to mid RCA stent  LVEDP 30 mm.  Successful PCI with DES stenting with a 3.5 x 20 mm Synergy DES stent postdilated to 3.75 mm with the 99% stenosis to 0%.  RECOMMENDATION: This patient developed acute coronary syndrome/anterior STEMI 6 days following initiation of Plavix following her recent non-STEMI and PCI to her RCA.  As result DAPT will be transitioned to Brilinta/aspirin.   Will obtain 2D echo Doppler study.  Will have colleagues relook at films regarding continue  aggressive medical therapy trial for her ostial diagonal/ramus like vessel of at least 85% stenosis versus attempt at staged possible PCI with cutting balloon.  TTE: 08/11/2018  IMPRESSIONS    1. The left ventricle has moderate-severely reduced systolic function, with an ejection fraction of 30-35%. The cavity size was normal. There is moderately increased left ventricular wall thickness. Left ventricular diastolic Doppler parameters are  consistent with impaired relaxation.  2. There is akinesis of the mid-apical anteroseptal and apical left ventricular segments.  3. The right ventricle has normal systolic function. The cavity was normal. There is no increase in right ventricular wall thickness.  4. Left atrial size was mildly dilated.  5. Trivial pericardial effusion.  6. The mitral valve is normal in structure.  7. The tricuspid valve is normal in structure.  8. The aortic valve is normal in structure.  9. Right atrial pressure is estimated at 3 mmHg. 10. The interatrial septum was not assessed. _____________   History of Present Illness     Lynn Hogan is a previously healthy woman who presented2/06/2018 with chest pain. She was in her usual state of health until 1/31afternoon when she developed a burning sensation in her chest when she was resting at home.  While in the ER, her troponins were found to be positive. She was admitted and placed on Heparin, ASA, statin Rx and beta blocker.  Her Troponin peaked at 0.67. Cath was done 08/02/2018 and revealed significant two-vessel coronary artery disease with 50-60% mid LAD, 80-90% ostial D1, and sequential 70% and 90% proximal/mid RCA stenoses.  She underwent successful PCI to proximal/mid RCA using Synergy 3.0 x 38 mm DES with 0% residual stenosis.  The plan was for medical therapy of residual LAD/diagonal disease. If Lynn Hogan had recurrent angina, PCI to D1 could be considered (though risk is increased by true ostium involvement and  potential for affecting the proximal LAD.  She was discharged 08/03/2018 and did well until this AM when she had sudden onset of chest pain. She took NTG and asa and EMS was called. EMS EKG showed AL stemi. Code stemi activated.  The pt was taken urgently to the cath lab by Dr Tresa Endo.   Hospital Course     Consultants: none   Underwent cardiac cath noted above with subtotal occlusion of the mLAD site that was previously 55% on cath from 08/02/2018. Successful PCI/DES to the mLAD, with patent previously placed RCA stent. She was on DAPT after her first cath on 2/3 and therefore switched to Brilinta this admission. Continue medical therapy for her ostial diag/ramus disease as this would have a high risk of compromising the LAD. She was continued on metoprolol, and statin. Amlodipine was stopped with lisinopril started instead. Troponin peaked at 17.14. She transferred from the unit on 2/11. Worked well with cardiac rehab without recurrent angina. Follow up echo this admission showed decline in EF to 30-35% with akinesis of the mid-apical anteroseptal and apical left ventricular segments. Given this finding she was fitted for a Lifevest prior to discharge.   Lynn Hogan was seen by Dr. Eldridge Dace and determined stable for discharge home. Follow up in the office has been arranged. Medications are listed below.   _____________  Discharge Vitals Blood pressure 120/68, pulse 81, temperature 98.8 F (37.1 C), temperature source Oral, resp. rate 18, height 5' 0.98" (1.549 m), weight 71.2 kg, SpO2 99 %.  Filed Weights   08/10/18 1017 08/11/18 0433  Weight: 74.8 kg 71.2 kg    Labs & Radiologic Studies    CBC Recent Labs    08/09/18 0317  WBC 8.3  HGB 10.5*  HCT 33.6*  MCV 96.3  PLT 361   Basic Metabolic Panel Recent Labs    16/07/37 0317 08/11/18 0750  NA 138 139  K 3.6 4.5  CL 102 107  CO2 22 27  GLUCOSE 124* 109*  BUN 5* 10  CREATININE 0.61 0.64  CALCIUM 9.0 9.0   Liver Function  Tests No results for input(s): AST, ALT, ALKPHOS, BILITOT, PROT, ALBUMIN in the last 72 hours. No results for input(s): LIPASE, AMYLASE in the last 72 hours. Cardiac Enzymes No results for input(s): CKTOTAL, CKMB, CKMBINDEX, TROPONINI in the last 72 hours. BNP Invalid input(s): POCBNP D-Dimer No results for input(s): DDIMER in the last 72 hours. Hemoglobin A1C No results for input(s): HGBA1C in the last 72 hours. Fasting Lipid Panel No results for input(s): CHOL, HDL, LDLCALC, TRIG, CHOLHDL, LDLDIRECT in the last 72 hours. Thyroid Function Tests No results for input(s): TSH, T4TOTAL, T3FREE, THYROIDAB in the last 72 hours.  Invalid input(s): FREET3 _____________  Dg Chest 2 View  Result Date: 07/31/2018 CLINICAL DATA:  Acute chest pain today. EXAM: CHEST - 2 VIEW COMPARISON:  08/21/2017 lumbar spine FINDINGS: UPPER limits normal heart size noted. There is no evidence of focal airspace disease, pulmonary edema, suspicious pulmonary nodule/mass, pleural effusion, or pneumothorax. No acute bony abnormalities are identified. Sclerosis of a LOWER thoracic vertebral body is nonspecific (question T10). IMPRESSION: No evidence of acute cardiopulmonary disease. Nonspecific sclerosis of a LOWER thoracic vertebral  body. Elective bone scan recommended for further evaluation. Electronically Signed   By: Harmon PierJeffrey  Hu M.D.   On: 07/31/2018 20:23   Dg Chest Port 1 View  Result Date: 08/08/2018 CLINICAL DATA:  Chest pain since this morning. Recent STEMI and stent. EXAM: PORTABLE CHEST 1 VIEW COMPARISON:  07/31/2018 FINDINGS: The heart is mildly enlarged but stable given the AP projection and portable technique. The lungs are clear. No pleural effusion. The bony thorax is intact. IMPRESSION: Mild stable cardiac enlargement but no acute pulmonary findings. Electronically Signed   By: Rudie MeyerP.  Gallerani M.D.   On: 08/08/2018 10:24   Disposition   Pt is being discharged home today in good condition.  Follow-up  Plans & Appointments    Follow-up Information    Azalee CourseMeng, Hao, [place] Follow up on 08/18/2018.   Specialties:  Cardiology, Radiology Why:  at 11am for your follow up appt.  Contact information: [address] [place] [address] [phone]          Discharge Instructions    (HEART FAILURE PATIENTS) Call MD:  Anytime you have any of the following symptoms: 1) 3 pound weight gain in 24 hours or 5 pounds in 1 week 2) shortness of breath, with or without a dry hacking cough 3) swelling in the hands, feet or stomach 4) if you have to sleep on extra pillows at night in order to breathe.   Complete by:  As directed    Amb Referral to Cardiac Rehabilitation   Complete by:  As directed    Referring to [place] CRP 2 again   Diagnosis:   STEMI Coronary Stents     Diet - low sodium heart healthy   Complete by:  As directed    Discharge instructions   Complete by:  As directed    Radial Site Care Refer to this sheet in the next few weeks. These instructions provide you with information on caring for yourself after your procedure. Your caregiver may also give you more specific instructions. Your treatment has been planned according to current medical practices, but problems sometimes occur. Call your caregiver if you have any problems or questions after your procedure. HOME CARE INSTRUCTIONS You may shower the day after the procedure.Remove the bandage (dressing) and gently wash the site with plain soap and water.Gently pat the site dry.  Do not apply powder or lotion to the site.  Do not submerge the affected site in water for 3 to 5 days.  Inspect the site at least twice daily.  Do not flex or bend the affected arm for 24 hours.  No lifting over 5 pounds (2.3 kg) for 5 days after your procedure.  Do not drive home if you are discharged the same day of the procedure. Have someone else drive you.   What to expect: Any bruising will usually fade within 1 to 2 weeks.  Blood  that collects in the tissue (hematoma) may be painful to the touch. It should usually decrease in size and tenderness within 1 to 2 weeks.  SEEK IMMEDIATE MEDICAL CARE IF: You have unusual pain at the radial site.  You have redness, warmth, swelling, or pain at the radial site.  You have drainage (other than a small amount of blood on the dressing).  You have chills.  You have a fever or persistent symptoms for more than 72 hours.  You have a fever and your symptoms suddenly get worse.  Your arm becomes pale, cool, tingly, or  numb.  You have heavy bleeding from the site. Hold pressure on the site.   PLEASE DO NOT MISS ANY DOSES OF YOUR BRILINTA!!!!! Also keep a log of you blood pressures and bring back to your follow up appt. Please call the office with any questions.   Patients taking blood thinners should generally stay away from medicines like ibuprofen, Advil, Motrin, naproxen, and Aleve due to risk of stomach bleeding. You may take Tylenol as directed or talk to your primary doctor about alternatives.   Increase activity slowly   Complete by:  As directed      Discharge Medications     Medication List    STOP taking these medications   amLODipine 5 MG tablet Commonly known as:  NORVASC   clopidogrel 75 MG tablet Commonly known as:  PLAVIX     TAKE these medications   aspirin 81 MG EC tablet Take 1 tablet (81 mg total) by mouth daily.   atorvastatin 80 MG tablet Commonly known as:  LIPITOR Take 1 tablet (80 mg total) by mouth daily at 6 PM.   GLUCOSAMINE CHONDR COMPLEX PO Take 1 tablet by mouth 2 (two) times daily with a meal.   lisinopril 5 MG tablet Commonly known as:  PRINIVIL,ZESTRIL Take 1 tablet (5 mg total) by mouth daily. Start taking on:  August 12, 2018   metoprolol tartrate 25 MG tablet Commonly known as:  LOPRESSOR Take 1 tablet (25 mg total) by mouth 2 (two) times daily.   nitroGLYCERIN 0.4 MG SL tablet Commonly known as:  NITROSTAT Place 1  tablet (0.4 mg total) under the tongue every 5 (five) minutes x 3 doses as needed for chest pain.   ticagrelor 90 MG Tabs tablet Commonly known as:  BRILINTA Take 1 tablet (90 mg total) by mouth 2 (two) times daily.       Acute coronary syndrome (MI, NSTEMI, STEMI, etc) this admission?: Yes.     AHA/ACC Clinical Performance & Quality Measures: 1. Aspirin prescribed? - Yes 2. ADP Receptor Inhibitor (Plavix/Clopidogrel, Brilinta/Ticagrelor or Effient/Prasugrel) prescribed (includes medically managed patients)? - Yes 3. Beta Blocker prescribed? - Yes 4. High Intensity Statin (Lipitor 40-80mg  or Crestor 20-40mg ) prescribed? - Yes 5. EF assessed during THIS hospitalization? - Yes 6. For EF <40%, was ACEI/ARB prescribed? - Yes 7. For EF <40%, Aldosterone Antagonist (Spironolactone or Eplerenone) prescribed? - No - Reason:  consider as outpatient 8. Cardiac Rehab Phase II ordered (Included Medically managed Patients)? - Yes      Outstanding Labs/Studies   BMET at follow up. LFTs/FLP in 6 weeks.  Duration of Discharge Encounter   Greater than 30 minutes including physician time.  Signed, Laverda Page NP-C 08/11/2018, 5:01 PM   I have examined the patient and reviewed assessment and plan and discussed with patient.  Agree with above as stated.  Continue medicines as outlined above.  She is walking without angina.  Medical therapy for her ostial diagonal lesion.  She is excited to go home.  We will plan for discharge today.  Echocardiogram showed an ejection fraction less than 35%, LifeVest was placed.  Recheck echocardiogram in 6 weeks.  Given that she has had 2 STEMI's, one inferior and one anterior in the last 10 days, it is not surprising that her ejection fraction was low.  She will be compliant with her medications.  She has good family support.  Lance Muss

## 2018-08-11 NOTE — Progress Notes (Signed)
  Echocardiogram 2D Echocardiogram has been performed.  Gerda Diss 08/11/2018, 11:23 AM

## 2018-08-11 NOTE — Progress Notes (Addendum)
Progress Note  Patient Name: Lynn Hogan Date of Encounter: 08/11/2018  Primary Cardiologist: No primary care provider on file.   Subjective   Patient feels well. Anxious to go home. Ambulating without SOB or CP. Discussed medication changes and cardiology follow-up. All questions and concerns addressed.   Inpatient Medications    Scheduled Meds: . aspirin EC  81 mg Oral Daily  . atorvastatin  80 mg Oral q1800  . metoprolol tartrate  25 mg Oral BID  . sodium chloride flush  3 mL Intravenous Q12H  . ticagrelor  90 mg Oral BID   Continuous Infusions: . sodium chloride Stopped (08/08/18 2138)  . sodium chloride     PRN Meds: sodium chloride, acetaminophen, diazepam, nitroGLYCERIN, ondansetron (ZOFRAN) IV, sodium chloride flush   Vital Signs    Vitals:   08/10/18 1358 08/10/18 2010 08/10/18 2230 08/11/18 0433  BP: 105/64 108/63 112/70 107/75  Pulse:  74 78 62  Resp:  17  18  Temp: 98.2 F (36.8 C) 99.3 F (37.4 C)  98 F (36.7 C)  TempSrc: Oral     SpO2:  95%  98%  Weight:    71.2 kg  Height:        Intake/Output Summary (Last 24 hours) at 08/11/2018 0811 Last data filed at 08/11/2018 0300 Gross per 24 hour  Intake 1203 ml  Output -  Net 1203 ml   Filed Weights   08/10/18 1017 08/11/18 0433  Weight: 74.8 kg 71.2 kg   Telemetry    NSR - Personally Reviewed  ECG    No new EKG - Personally Reviewed  Physical Exam   Today's Vitals   08/10/18 2230 08/10/18 2233 08/10/18 2330 08/11/18 0433  BP: 112/70   107/75  Pulse: 78   62  Resp:    18  Temp:    98 F (36.7 C)  TempSrc:      SpO2:    98%  Weight:    71.2 kg  Height:      PainSc:  5  Asleep    Body mass index is 29.68 kg/m.  GEN: No acute distress.   Neck: No JVD Cardiac: RRR, faint systolic crescendo-decrescendo murmur.  Respiratory: Clear to auscultation bilaterally. GI: Soft, nontender, non-distended  MS: No edema; No deformity. Right arm cath site without hematoma but does have some  soft ecchymosis  Neuro:  Nonfocal  Psych: Normal affect   Labs    Chemistry Recent Labs  Lab 08/08/18 1015 08/08/18 1053 08/08/18 1105 08/09/18 0317  NA 135  --  135 138  K 3.8  --  3.8 3.6  CL 101  --   --  102  CO2 23  --   --  22  GLUCOSE 172*  --   --  124*  BUN 9  --   --  5*  CREATININE 0.61 0.40*  --  0.61  CALCIUM 9.2  --   --  9.0  PROT 7.0  --   --   --   ALBUMIN 3.5  --   --   --   AST 21  --   --   --   ALT 14  --   --   --   ALKPHOS 57  --   --   --   BILITOT 0.7  --   --   --   GFRNONAA >60  --   --  >60  GFRAA >60  --   --  >60  ANIONGAP 11  --   --  14    Hematology Recent Labs  Lab 08/08/18 1015 08/08/18 1105 08/09/18 0317  WBC 9.9  --  8.3  RBC 3.58*  --  3.49*  HGB 11.5* 11.6* 10.5*  HCT 35.7* 34.0* 33.6*  MCV 99.7  --  96.3  MCH 32.1  --  30.1  MCHC 32.2  --  31.3  RDW 12.8  --  12.6  PLT 454*  --  361   Cardiac Enzymes Recent Labs  Lab 08/08/18 1015 08/08/18 1423  TROPONINI 0.30* 17.14*   No results for input(s): TROPIPOC in the last 168 hours.   BNPNo results for input(s): BNP, PROBNP in the last 168 hours.   DDimer No results for input(s): DDIMER in the last 168 hours.   Radiology    No results found. Cardiac Studies   Left Heart Cath 08/08/2018  Ost Ramus lesion is 85% stenosed.  Prox LAD to Mid LAD lesion is 99% stenosed.  Previously placed Prox RCA to Mid RCA stent (unknown type) is widely patent.  Post intervention, there is a 0% residual stenosis.  A stent was successfully placed.  Patient Profile     73 y.o. female with HLD recently admitted on 2/3 for chest pain with DES placement to the RCA who was readmitted on 2/9 with a STEMI and subsequently underwent PCI with DES placement to the proximal LAD.   Assessment & Plan    STEMI  CAD s/p 2 DES (RCA and proximal LAD) - Able to ambulate without SOB or CP  - Echocardiogram not done but can be completed as outpatient  - Discharge today on: Lisinopril 5 mg  QD, Ticagrelor 90 mg QD, ASA 81 mg QD, Metoprolol tartrate 25 mg BID, and Atorvastatin 80mg  QD  - Will need follow-up in 1 week with cardiology   For questions or updates, please contact [facility] Please consult [email or web address] for contact info under Cardiology/STEMI.     Signed, Levora Dredge, MD  08/11/2018, 8:11 AM     I have examined the patient and reviewed assessment and plan and discussed with patient.  Agree with above as stated.  Await echo result.  No clinical signs of heart failure.  Continue medicines as outlined above.  She is walking without angina.  Medical therapy for her ostial diagonal lesion.  She is excited to go home.  We will plan for discharge later today.  If the echocardiogram shows an ejection fraction less than 35%, would have to consider LifeVest.  Based on physical exam, I think her ejection fraction will be better.  Lance Muss

## 2018-08-11 NOTE — Progress Notes (Signed)
Reviewed all d/c instructions with patient, she verbalized understanding. States she is eager to go home.  Awaiting life vest at this time.

## 2018-08-11 NOTE — Care Management Important Message (Signed)
Important Message  Patient Details  Name: Lynn Hogan MRN: [id number] Date of Birth: May 01, 1946   Medicare Important Message Given:  Yes    Oralia Rud Jemeka Wagler 08/11/2018, 4:30 PM

## 2018-08-11 NOTE — Progress Notes (Signed)
1340-1410 Noted that EF low from ECHO. Gave pt CHF booklet and discussed zones and when to call MD. Encouraged daily weights, 2000 mg sodium restriction and 2LFR. Put on life vest video for pt to view. Luetta Nutting RN BSN 08/11/2018 2:09 PM  r

## 2018-08-11 NOTE — Care Management (Addendum)
Life vest info faxed and confirmation received. Once approved by insurance, Life Vest will be brought to hospital and patient will be fitted prior to discharge. Patient cannot leave without Life Vest. Signed order is on chart.

## 2018-08-18 ENCOUNTER — Ambulatory Visit: Payer: Medicare HMO | Admitting: Physician Assistant

## 2018-08-18 ENCOUNTER — Other Ambulatory Visit: Payer: Self-pay | Admitting: Physician Assistant

## 2018-08-18 ENCOUNTER — Encounter: Payer: Self-pay | Admitting: Physician Assistant

## 2018-08-18 VITALS — BP 132/66 | HR 60 | Ht 61.0 in | Wt 158.8 lb

## 2018-08-18 DIAGNOSIS — I2510 Atherosclerotic heart disease of native coronary artery without angina pectoris: Secondary | ICD-10-CM | POA: Diagnosis not present

## 2018-08-18 DIAGNOSIS — I255 Ischemic cardiomyopathy: Secondary | ICD-10-CM

## 2018-08-18 DIAGNOSIS — E785 Hyperlipidemia, unspecified: Secondary | ICD-10-CM

## 2018-08-18 MED ORDER — METOPROLOL SUCCINATE ER 50 MG PO TB24: 50.0000 mg | ORAL_TABLET | Freq: Every day | ORAL | 3 refills | Status: DC

## 2018-08-18 MED ORDER — LISINOPRIL 10 MG PO TABS: 10.0000 mg | ORAL_TABLET | Freq: Every day | ORAL | 11 refills | Status: DC

## 2018-08-18 NOTE — Progress Notes (Signed)
Transitions of Care Follow Up Call Note  Lynn Hogan is an 73 y.o. female who presented to [facility] on 08/08/2018.  The patient had the following prescriptions filled at [facility]: Lisinopril, Brilinta  [x]  Patient's prescriptions filled at the [facility] were transferred to the following pharmacy: Karin Golden at [facility] [x]  Patient unable to be reached after calling three times and prescriptions filled at the [facility] were transferred to preferred pharmacy found within their chart.   Lawerance Bach 08/18/2018, 5:44 PM Transitions of Care Pharmacy Hours: Monday - Friday 8:30am to 5:00 PM  Phone - [phone]

## 2018-08-18 NOTE — Progress Notes (Signed)
Cardiology Office Note    Date:  08/20/2018   ID:  Lynn Hogan, DOB Feb 16, 1946, MRN [id number]  PCP:  Si Gaul, DO  Cardiologist:  Dr. Tresa Endo   Chief Complaint  Patient presents with  . Follow-up    seen for Dr. Tresa Endo.     History of Present Illness:  Lynn Hogan is a 73 y.o. female with recently diagnosed HLD and CAD.  He has no prior cardiac history prior to her presentation with NSTEMI on 07/31/2018.  She developed burning sensation in her chest which she thought was acid reflux.  Initial troponin was elevated.  Echocardiogram obtained on 08/01/2018 showed EF 60 to 65%, mild to moderate MR.  She underwent cardiac catheterization on 08/02/2018 which revealed a significant two-vessel CAD with 50 to 60% mid LAD, 80 to 90% ostial D1, and sequential 70% and 90% proximal to mid RCA disease treated with synergy 3.0 x 38 mm DES.  Diagonal lesion was managed medically and recommended PCI if has recurrent angina.  Postprocedure, patient was placed on aspirin and Plavix.  Patient was discharged on 08/03/2018, unfortunately prior to follow-up, she returned on 08/08/2018 with STEMI.  Repeat cardiac catheterization on 08/08/2018 showed patent proximal to mid RCA stent, 85% ostial ramus lesion, 99% proximal to mid LAD lesion which has increased from the previous 55% disease.  LAD lesion was treated with a 3.5 x 20 mm Synergy DES.  Plavix was transitioned to Brilinta.  Repeat echocardiogram obtained on 2/12 showed EF down to 30 to 35%, akinesis of the mid apical anteroseptal and apical LV segments.  She was discharged on metoprolol tartrate and lisinopril.  She was fitted for LifeVest prior to discharge.  Patient presents today for cardiology office visit.  EKG continued to show Q waves in the anterior and inferior leads.  She denies any further chest discomfort.  She has been compliant with aspirin and Brilinta.  I instructed her to go back to work starting next Monday.  She only works part-time to [employer] in [place]. It does not involve any strenuous activity.  She has no lower extremity edema, orthopnea or PND. I consolidated her metoprolol tartrate to metoprolol succinate.  I will also increase her lisinopril to 10 mg daily.  I do not think I can uptitrate her heart failure medication any further.  She will need a repeat echocardiogram in 3 months before her next visit with Dr. Tresa Endo.   Past Medical History:  Diagnosis Date  . Coronary artery disease   . Hyperlipidemia   . NSTEMI (non-ST elevated myocardial infarction) (HCC)    08/02/2018-PCI/DES to RCA   . STEMI (ST elevation myocardial infarction) (HCC)    08/08/2018-PCI/DES to mLAD, 85% osital stenosis of 1st diag.   Marland Kitchen Systolic heart failure [facility])     Past Surgical History:  Procedure Laterality Date  . CARDIAC CATHETERIZATION    . CATARACT EXTRACTION, BILATERAL  02/12/2016  . CORONARY STENT INTERVENTION  08/02/2018  . CORONARY STENT INTERVENTION N/A 08/02/2018   Procedure: CORONARY STENT INTERVENTION;  Surgeon: Yvonne Kendall, MD;  Location: [facility] INVASIVE CV [facility];  Service: Cardiovascular;  Laterality: N/A;  . CORONARY/GRAFT ACUTE MI REVASCULARIZATION N/A 08/08/2018   Procedure: Coronary/Graft Acute MI Revascularization;  Surgeon: [name], MD;  Location: [facility] INVASIVE CV LAB;  Service: Cardiovascular;  Laterality: N/A;  . HYSTERECTOMY ABDOMINAL WITH SALPINGECTOMY  02/12/1971  . LEFT HEART CATH AND CORONARY ANGIOGRAPHY N/A 08/02/2018   Procedure: LEFT HEART CATH AND  CORONARY ANGIOGRAPHY;  Surgeon: Yvonne KendallEnd, Christopher, MD;  Location: [facility] CV [facility];  Service: Cardiovascular;  Laterality: N/A;  . LEFT HEART CATH AND CORONARY ANGIOGRAPHY N/A 08/08/2018   Procedure: LEFT HEART CATH AND CORONARY ANGIOGRAPHY;  Surgeon: Lennette BihariKelly, Thomas A, MD;  Location: [facility];  Service: Cardiovascular;  Laterality: N/A;    Current Medications: Outpatient Medications Prior to Visit  Medication Sig Dispense Refill  . aspirin EC  81 MG EC tablet Take 1 tablet (81 mg total) by mouth daily. 90 tablet 3  . atorvastatin (LIPITOR) 80 MG tablet Take 1 tablet (80 mg total) by mouth daily at 6 PM. 30 tablet 6  . Glucosamine-Chondroitin (GLUCOSAMINE CHONDR COMPLEX PO) Take 1 tablet by mouth 2 (two) times daily with a meal.     . ticagrelor (BRILINTA) 90 MG TABS tablet Take 1 tablet (90 mg total) by mouth 2 (two) times daily. 60 tablet 11  . lisinopril (PRINIVIL,ZESTRIL) 5 MG tablet Take 1 tablet (5 mg total) by mouth daily. 30 tablet 11  . metoprolol tartrate (LOPRESSOR) 25 MG tablet Take 1 tablet (25 mg total) by mouth 2 (two) times daily. 60 tablet 6  . nitroGLYCERIN (NITROSTAT) 0.4 MG SL tablet Place 1 tablet (0.4 mg total) under the tongue every 5 (five) minutes x 3 doses as needed for chest pain. (Patient not taking: Reported on 08/18/2018) 25 tablet 12   No facility-administered medications prior to visit.      Allergies:   Ibuprofen; Nsaids; and Banana   Social History   Socioeconomic History  . Marital status: Single    Spouse name: Not on file  . Number of children: Not on file  . Years of education: Not on file  . Highest education level: Not on file  Occupational History  . Not on file  Social Needs  . Financial resource strain: Not hard at all  . Food insecurity:    Worry: Patient refused    Inability: Patient refused  . Transportation needs:    Medical: Patient refused    Non-medical: Patient refused  Tobacco Use  . Smoking status: Never Smoker  . Smokeless tobacco: Never Used  Substance and Sexual Activity  . Alcohol use: Never    Frequency: Never  . Drug use: Never  . Sexual activity: Not Currently  Lifestyle  . Physical activity:    Days per week: 7 days    Minutes per session: 30 min  . Stress: Only a little  Relationships  . Social connections:    Talks on phone: Patient refused    Gets together: Patient refused    Attends religious service: Patient refused    Active member of club or  organization: Patient refused    Attends meetings of clubs or organizations: Patient refused    Relationship status: Patient refused  Other Topics Concern  . Not on file  Social History Narrative  . Not on file     Family History:  The patient's family history includes Brain cancer in her mother; Heart attack (age of onset: 5955) in her father; Lung cancer in her mother; Microcephaly in her mother.   ROS:   Please see the history of present illness.    ROS All other systems reviewed and are negative.   PHYSICAL EXAM:   VS:  BP 132/66   Pulse 60   Ht 5\' 1"  (1.549 m)   Wt 158 lb 12.8 oz (72 kg)   SpO2 98%   BMI 30.00 kg/m  GEN: Well nourished, well developed, in no acute distress  HEENT: normal  Neck: no JVD, carotid bruits, or masses Cardiac: RRR; no murmurs, rubs, or gallops,no edema  Respiratory:  clear to auscultation bilaterally, normal work of breathing GI: soft, nontender, nondistended, + BS MS: no deformity or atrophy  Skin: warm and dry, no rash Neuro:  Alert and Oriented x 3, Strength and sensation are intact Psych: euthymic mood, full affect  Wt Readings from Last 3 Encounters:  08/18/18 158 lb 12.8 oz (72 kg)  08/11/18 157 lb (71.2 kg)  08/03/18 164 lb 14.5 oz (74.8 kg)      Studies/Labs Reviewed:   EKG:  EKG is ordered today.  The ekg ordered today demonstrates normal sinus rhythm, inferior and anterior infarct  Recent Labs: 07/31/2018: TSH 1.824 08/08/2018: ALT 14 08/09/2018: Hemoglobin 10.5; Platelets 361 08/11/2018: BUN 10; Creatinine, Ser 0.64; Potassium 4.5; Sodium 139   Lipid Panel    Component Value Date/Time   CHOL 127 08/08/2018 1015   TRIG 92 08/08/2018 1015   HDL 38 (L) 08/08/2018 1015   CHOLHDL 3.3 08/08/2018 1015   VLDL 18 08/08/2018 1015   LDLCALC 71 08/08/2018 1015    Additional studies/ records that were reviewed today include:   Cath 08/02/2018 FINDINGS: 1. Significant 2-vessel CAD with 50-60% mid LAD, 80-90% ostial D1, and  sequential 70% and 90% proximal/mid RCA stenoses. 2. Upper normal to mildly elevated LVEDP with vigorous LV contraction. 3. Successful PCI to proximal/mid RCA using Synergy 3.0 x 38 mm DES with 0% residual stenosis and TIMI-3 flow.  RECOMMENDATIONS: 1. Aggressive secondary prevention. 2. DAPT with ASA and clopidogrel for at least 12 months. 3. If patient has recurrent angina, consider PCI to ostial D1 (though lesion is suboptimal intervention target due to potential to compromise proximal LAD).   Cath 08/08/2018  Ost Ramus lesion is 85% stenosed.  Prox LAD to Mid LAD lesion is 99% stenosed.  Previously placed Prox RCA to Mid RCA stent (unknown type) is widely patent.  Post intervention, there is a 0% residual stenosis.  A stent was successfully placed.   Acute anterior ST segment elevation myocardial infarction secondary to subtotal occlusion of the mid LAD at site of previous 55% stenosis from catheterization in August 02, 2018.  85% ostial stenosis and a very proximal first diagonal vessel (ramus intermediate like vessel not significantly changed from the 08/02/2018 evaluation.  Normal left circumflex coronary artery.  Widely patent proximal to mid RCA stent  LVEDP 30 mm.  Successful PCI with DES stenting with a 3.5 x 20 mm Synergy DES stent postdilated to 3.75 mm with the 99% stenosis to 0%.  RECOMMENDATION: This patient developed acute coronary syndrome/anterior STEMI 6 days following initiation of Plavix following her recent non-STEMI and PCI to her RCA.  As result DAPT will be transitioned to Brilinta/aspirin.   Will obtain 2D echo Doppler study.  Will have colleagues relook at films regarding continue aggressive medical therapy trial for her ostial diagonal/ramus like vessel of at least 85% stenosis versus attempt at staged possible PCI with cutting balloon.   Echo 08/11/2018 IMPRESSIONS    1. The left ventricle has moderate-severely reduced systolic function,  with an ejection fraction of 30-35%. The cavity size was normal. There is moderately increased left ventricular wall thickness. Left ventricular diastolic Doppler parameters are  consistent with impaired relaxation.  2. There is akinesis of the mid-apical anteroseptal and apical left ventricular segments.  3. The right ventricle has normal systolic function. The  cavity was normal. There is no increase in right ventricular wall thickness.  4. Left atrial size was mildly dilated.  5. Trivial pericardial effusion.  6. The mitral valve is normal in structure.  7. The tricuspid valve is normal in structure.  8. The aortic valve is normal in structure.  9. Right atrial pressure is estimated at 3 mmHg. 10. The interatrial septum was not assessed.  ASSESSMENT:    1. Coronary artery disease involving native coronary artery of native heart without angina pectoris   2. Ischemic cardiomyopathy   3. Hyperlipidemia LDL goal <70      PLAN:  In order of problems listed above:  1. CAD: Recently underwent DES to RCA and also proximal LAD.  Continue aspirin and Brilinta, emphasized on the compliance of the medication  2. Ischemic cardiomyopathy: EF was 30 to 35% after the recent anterior STEMI.  I will increase lisinopril to 10 mg daily and consolidate metoprolol tartrate to metoprolol succinate.  I do not think I can further uptitrate heart failure medication given her blood pressure, I plan to obtain a 90-month repeat echocardiogram.  If EF remains at 35% or lower, she will need ICD.  She is currently wearing a LifeVest  3. Hyperlipidemia: LDL goal less than 70.  She will need a basic metabolic panel in a few weeks.  Continue on the current statin therapy.  Although her LDL was initially 194, however repeat LDL during the second admission recently showed it has came down to 71.    Medication Adjustments/Labs and Tests Ordered: Current medicines are reviewed at length with the patient today.  Concerns  regarding medicines are outlined above.  Medication changes, Labs and Tests ordered today are listed in the Patient Instructions below. Patient Instructions  Medication Instructions:  STOP METOPROLOL TARTRATE (LOPRESSOR) START METOPROLOL SUCCINATE (TOPROL) 50 MG BY MOUTH DAILY INCREASE LISINOPRIL 10 MG BY MOUTH DAILY  If you need a refill on your cardiac medications before your next appointment, please call your pharmacy.   Lab work: NONE   If you have labs (blood work) drawn today and your tests are completely normal, you will receive your results only by: Marland Kitchen MyChart Message (if you have MyChart) OR . A paper copy in the mail If you have any lab test that is abnormal or we need to change your treatment, we will call you to review the results.  Testing/Procedures:  Your physician has requested that you have an echocardiogram. Echocardiography is a painless test that uses sound waves to create images of your heart. It provides your doctor with information about the size and shape of your heart and how well your heart's chambers and valves are working. This procedure takes approximately one hour. There are no restrictions for this procedure.  SCHEDULE FOR THE MIDDLE OF MAY 2020  [address]  Follow-Up: Your physician recommends that you schedule a follow-up appointment AFTER ECHO with Nicki Guadalajara, MD         Signed, Azalee Course, [place]  08/20/2018 10:19 AM    [facility] [address] [place], [place], [place]  [address] Phone: [phone]; Fax: [phone]

## 2018-08-18 NOTE — Patient Instructions (Addendum)
Medication Instructions:  STOP METOPROLOL TARTRATE (LOPRESSOR) START METOPROLOL SUCCINATE (TOPROL) 50 MG BY MOUTH DAILY INCREASE LISINOPRIL 10 MG BY MOUTH DAILY  If you need a refill on your cardiac medications before your next appointment, please call your pharmacy.   Lab work: NONE   If you have labs (blood work) drawn today and your tests are completely normal, you will receive your results only by: Marland Kitchen MyChart Message (if you have MyChart) OR . A paper copy in the mail If you have any lab test that is abnormal or we need to change your treatment, we will call you to review the results.  Testing/Procedures:  Your physician has requested that you have an echocardiogram. Echocardiography is a painless test that uses sound waves to create images of your heart. It provides your doctor with information about the size and shape of your heart and how well your heart's chambers and valves are working. This procedure takes approximately one hour. There are no restrictions for this procedure.  SCHEDULE FOR THE MIDDLE OF MAY 2020  [address]  Follow-Up: Your physician recommends that you schedule a follow-up appointment AFTER ECHO with Nicki Guadalajara, MD

## 2018-08-20 ENCOUNTER — Encounter: Payer: Self-pay | Admitting: Physician Assistant

## 2018-08-20 ENCOUNTER — Telehealth: Payer: Self-pay | Admitting: Physician Assistant

## 2018-08-20 NOTE — Telephone Encounter (Signed)
Have her cut the lisinopril in half and do 5 mg bid.  She could also just move the whole 10 mg to bedtime, but I think she'd have fewer issues by dividing the dose

## 2018-08-20 NOTE — Telephone Encounter (Signed)
Called patient , notified of PharmD recommendations. Patient verbalized understanding.

## 2018-08-20 NOTE — Telephone Encounter (Signed)
  Pt c/o medication issue:  1. Name of Medication: lisinopril (PRINIVIL,ZESTRIL) 10 MG tablet  2. How are you currently taking this medication (dosage and times per day)?   3. Are you having a reaction (difficulty breathing--STAT)? Stomach issues  4. What is your medication issue? Patient was taking 5 mg now she takes 10 mg of Lisinopril. Her stomach is messed up and she would like to talk to someone

## 2018-08-20 NOTE — Telephone Encounter (Signed)
Called patient she states that she was seen on Wednesday by Azalee Course, PA. At that visit they changed her medications around. They increased her Lisinopril to 10 mg from 5 mg, she was changed to start Metoprolol Succinate instead of Tartrate. She was to take her Lisinopril in the morning along with her Brilinta and Aspirin along with food. She takes the Metoprolol in the evening with food. Patient states that it is causing her stomach issues and it is worse in the morning after she takes all of her medications. Patient states she feels 'loopy'.  Daughter was also on this call, and states that her mother took off her life vest because it has became uncomfortable and states it has an error message on it. I notified her and the patient to contact the number for the vest as she needs to have that fixed and needs to continue wearing the vest.  I advised I would send a message back to our PharmD department to have them advise on the medication issue.  Patient and daughter verbalized understanding.   Please advise?

## 2018-08-27 ENCOUNTER — Other Ambulatory Visit: Payer: Self-pay | Admitting: *Deleted

## 2018-08-30 ENCOUNTER — Telehealth: Payer: Self-pay | Admitting: Physician Assistant

## 2018-08-30 MED ORDER — LISINOPRIL 10 MG PO TABS: 5.0000 mg | ORAL_TABLET | Freq: Every day | ORAL | 11 refills | Status: DC

## 2018-08-30 NOTE — Telephone Encounter (Signed)
Will need follow up appointment

## 2018-08-30 NOTE — Telephone Encounter (Signed)
Pt c/o medication issue:  1. Name of Medication: lisinopril (PRINIVIL,ZESTRIL) 10 MG tablet  2. How are you currently taking this medication (dosage and times per day)? Take 1 tablet (10 mg total) by mouth daily.  3. Are you having a reaction (difficulty breathing--STAT)? no  4. What is your medication issue? Makes her BP go really low.  This morning 99/61, makes her lightheaded.

## 2018-08-30 NOTE — Telephone Encounter (Signed)
Spoke to patient. 1)She has to issues -blood pressure is low after   taking lisinopril  5 mg . Patient states she has been taking 5 mg b.i.d. since last telephone.  patient states prior to taking medication is 115-120/? , after dropped 99/66. She staes she feels weak and lighteded .  she states she takes this medication after breakfast @10 :30 am . She takes metoprolol @ 6 pm -   2) she states she is having issues with [employer] lifevest -  It is alarming due to the  Wires getting tangle with electrodes. She states she has called the company- representative  [employer]) came to her home - she has the right vest size. She wanted to know if there is different options because it is affecting her mentally and the vest is tight   RN  informed patient will defer to Doctor and extender medication.  Recommend her contacting [employer] about the vest. She verbalized understading

## 2018-08-30 NOTE — Telephone Encounter (Signed)
She can cut back to just 5 mg lisinopril once daily.  She isn't tolerating the increased dose.  Will leave Life Vest question for MD

## 2018-08-30 NOTE — Telephone Encounter (Signed)
Patient is aware of decrease in lisinopril and medication change on medication list.  patient aware of awaiting for  Response to life vest

## 2018-08-31 NOTE — Telephone Encounter (Signed)
SPOKE TO PATIENT.SHE AWARE APPOINTMENT IS NEEDED. SCHEDULE FOR 09/06/18 AT 10:40 AM

## 2018-09-01 NOTE — Telephone Encounter (Signed)
Noted  

## 2018-09-06 ENCOUNTER — Ambulatory Visit: Payer: Medicare HMO | Admitting: Cardiovascular Disease

## 2018-09-10 ENCOUNTER — Ambulatory Visit: Payer: Medicare HMO | Admitting: Cardiovascular Disease

## 2018-09-16 ENCOUNTER — Telehealth: Payer: Self-pay | Admitting: Cardiovascular Disease

## 2018-09-16 NOTE — Telephone Encounter (Signed)
Lynn Hogan, to advised that I had not seen paperwork for this patient, would be happy to take a look at it if I could have sent back into me with attn to me.   Left call back number, advised that I would be contacting her tomorrow with word from [facility] on if patient should continue to wear vest or not.

## 2018-09-16 NOTE — Telephone Encounter (Signed)
Spoke with courtney, aware of the EF% from echo in February this year. She reports paperwork was sent to dr Tresa Endo from insurance and she wants to know if we got that, aware will forward to julie to check. She also wants to know if dr Tresa Endo wants the patient to continue wearing the life vest. Will forward to dr Tresa Endo to review and advise.

## 2018-09-16 NOTE — Telephone Encounter (Signed)
° ° °  Lynn Hogan from [employer],   Would like information on EF%, also would like to confirm if we received treatment plan form.

## 2018-09-17 NOTE — Telephone Encounter (Signed)
Arrange for ov with me in 4 weeks;if necessary meds can be adjusted at that time prio to her planned echo in May; continue life-vest for now.

## 2018-09-17 NOTE — Telephone Encounter (Signed)
Called patient, advised that Dr.Kelly would like to follow up with her in 4 weeks. Patient is fine with that follow up, but states that she refuses to wear the vest. I advised with patient on the importance of this device, and patient states that she is not wearing it anymore, and will discuss with Dr.Kelly at this 4 week appointment.   Appointment placed in April.

## 2018-10-22 ENCOUNTER — Telehealth: Payer: Self-pay | Admitting: Cardiovascular Disease

## 2018-10-22 NOTE — Telephone Encounter (Signed)
I DO NOT THINK PT NEEDS LABS AT THIS TIME SHE WAS IN THE HOSPITAL IN February AND HAS MANY DONE AT THAT TIME, I LEFT DETAILED MESSAGE TO ASK TK AT APPT

## 2018-10-22 NOTE — Telephone Encounter (Signed)
Pt called her PCP and informed them that she needed lab work done per Dr. Landry Dyke orders. I informed the PCP that she does not have any orders in, but does have a virtual visit scheduled for Tuesday.  The practice is not on Epic yet. If the patient does need blood work, can you fax the orders to  [phone]

## 2018-10-25 ENCOUNTER — Telehealth: Payer: Self-pay | Admitting: Cardiovascular Disease

## 2018-10-25 NOTE — Telephone Encounter (Signed)
Noted, will be on the lookout for fax.

## 2018-10-25 NOTE — Telephone Encounter (Signed)
New Message      Leavy Cella is calling because she is going to fax information to Dr Tresa Endo

## 2018-10-25 NOTE — Telephone Encounter (Signed)
Will forward to Lind Covert LPN with Dr Tresa Endo, she will be in office tomorrow to look for fax

## 2018-10-25 NOTE — Telephone Encounter (Signed)
Mychart, smartphone, pre reg complete 10/25/18 AF °

## 2018-10-26 ENCOUNTER — Encounter: Payer: Self-pay | Admitting: Cardiovascular Disease

## 2018-10-26 ENCOUNTER — Telehealth (INDEPENDENT_AMBULATORY_CARE_PROVIDER_SITE_OTHER): Payer: Medicare HMO | Admitting: Cardiovascular Disease

## 2018-10-26 DIAGNOSIS — I249 Acute ischemic heart disease, unspecified: Secondary | ICD-10-CM | POA: Diagnosis not present

## 2018-10-26 DIAGNOSIS — E785 Hyperlipidemia, unspecified: Secondary | ICD-10-CM

## 2018-10-26 DIAGNOSIS — I255 Ischemic cardiomyopathy: Secondary | ICD-10-CM

## 2018-10-26 DIAGNOSIS — I2510 Atherosclerotic heart disease of native coronary artery without angina pectoris: Secondary | ICD-10-CM

## 2018-10-26 NOTE — Progress Notes (Signed)
Virtual Visit via Telephone Note   This visit type was conducted due to national recommendations for restrictions regarding the COVID-19 Pandemic (e.g. social distancing) in an effort to limit this patient's exposure and mitigate transmission in our community.  Due to her co-morbid illnesses, this patient is at least at moderate risk for complications without adequate follow up.  This format is felt to be most appropriate for this patient at this time.  The patient did not have access to video technology/had technical difficulties with video requiring transitioning to audio format only (telephone).  All issues noted in this document were discussed and addressed.  No physical exam could be performed with this format.  Please refer to the patient's chart for her  consent to telehealth for [facility].   Evaluation Performed:  Follow-up visit  Date:  10/26/2018   ID:  Lynn Hogan, DOB May 18, 1946, MRN [id number]  Patient Location: Home Provider Location: Office  PCP:  Si Gaul, DO  Cardiologist:  Nicki Guadalajara, MD  Electrophysiologist:  None   Chief Complaint:  Initial hospital follow-up evaluation with me following her anterior STEMI  History of Present Illness:    Lynn Hogan is a 73 y.o. female who suffered a non-ST segment elevation myocardial infarction in a developed initial substernal chest burning which initially she felt was due to acid reflux.  She ultimately presented to [facility].  On August 02, 2018 she underwent initial catheterization and was found to have high-grade segmental tandem proximal to mid RCA stenoses which was felt to be the culprit lesion contributing to her end STEMI.  She underwent insertion of a 3.0 x 38 mm Synergy stent.  She was found to have very proximal diagonal vessel of the LAD that had ostial narrowing of 85 to 90% in a vessel which had a ramus intermediate like distribution.  In addition there was a 55% mid LAD stenosis.  She was discharged  the following day on aspirin, Plavix and amlodipine, atorvastatin and metoprolol.  She had done well but on the morning of August 08, 2018 she developed severe substernal chest pressure.  EMS was contacted and a code STEMI was activated and her ECG showed acute anterior injury current.  Upon arrival to [facility] ER following initiation of heparin her chest pain was significantly improved but still present.  She was taken acutely to the Cath Lab by me and was found to have subtotal occlusion of the mid LAD at the site of previous 55% stenosis.  She underwent successful stenting with a 3.5 x 20 mm Synergy DES stent postdilated to 3.75 mm in the 99% stenosis reduced to 0%.  There was no change in her previously noted 85% ostial stenosis in the very proximal diagonal vessel.  Her initial post anterior STEMI echo Doppler study showed reduction in EF from her previous echo of 60 to 65% to 30 to 35%.  She ultimately was dischargedOn metoprolol tartrate and lisinopril was fitted for a LifeVest.  Subsequently, she was enrolled in the cardiac rehab program which she currently is undergoing at [facility] location.  She is doing well and denies recurrent anginal type symptomatology.  She wore the LifeVest for 5 weeks but then became significantly frustrated with it and ultimately turned it back in.  She is unaware of any recurrent chest pain.  She denies any tachypalpitations.  She denies PND orthopnea.  She denies any dyspnea.  She now presents for initial post hospital evaluation with me.  The patient does not have symptoms concerning for COVID-19 infection (fever, chills, cough, or new shortness of breath).    Past Medical History:  Diagnosis Date   Coronary artery disease    Hyperlipidemia    NSTEMI (non-ST elevated myocardial infarction) (HCC)    08/02/2018-PCI/DES to RCA    STEMI (ST elevation myocardial infarction) (HCC)    08/08/2018-PCI/DES to mLAD, 85% osital stenosis of 1st diag.     Systolic heart failure [facility])    Past Surgical History:  Procedure Laterality Date   CARDIAC CATHETERIZATION     CATARACT EXTRACTION, BILATERAL  02/12/2016   CORONARY STENT INTERVENTION  08/02/2018   CORONARY STENT INTERVENTION N/A 08/02/2018   Procedure: CORONARY STENT INTERVENTION;  Surgeon: Yvonne Kendall, MD;  Location: [facility] INVASIVE CV LAB;  Service: Cardiovascular;  Laterality: N/A;   CORONARY/GRAFT ACUTE MI REVASCULARIZATION N/A 08/08/2018   Procedure: Coronary/Graft Acute MI Revascularization;  Surgeon: [name], MD;  Location: [facility] INVASIVE CV LAB;  Service: Cardiovascular;  Laterality: N/A;   HYSTERECTOMY ABDOMINAL WITH SALPINGECTOMY  02/12/1971   LEFT HEART CATH AND CORONARY ANGIOGRAPHY N/A 08/02/2018   Procedure: LEFT HEART CATH AND CORONARY ANGIOGRAPHY;  Surgeon: Yvonne Kendall, MD;  Location: [facility] INVASIVE CV LAB;  Service: Cardiovascular;  Laterality: N/A;   LEFT HEART CATH AND CORONARY ANGIOGRAPHY N/A 08/08/2018   Procedure: LEFT HEART CATH AND CORONARY ANGIOGRAPHY;  Surgeon: [name], MD;  Location: [facility] INVASIVE CV LAB;  Service: Cardiovascular;  Laterality: N/A;     Current Meds  Medication Sig   aspirin EC 81 MG EC tablet Take 1 tablet (81 mg total) by mouth daily.   atorvastatin (LIPITOR) 80 MG tablet Take 1 tablet (80 mg total) by mouth daily at 6 PM.   Glucosamine-Chondroitin (GLUCOSAMINE CHONDR COMPLEX PO) Take 1 tablet by mouth 2 (two) times daily with a meal.    lisinopril (ZESTRIL) 5 MG tablet Take 5 mg by mouth daily.   metoprolol succinate (TOPROL-XL) 50 MG 24 hr tablet Take 1 tablet (50 mg total) by mouth daily. Take with or immediately following a meal.   nitroGLYCERIN (NITROSTAT) 0.4 MG SL tablet Place 1 tablet (0.4 mg total) under the tongue every 5 (five) minutes x 3 doses as needed for chest pain.   ticagrelor (BRILINTA) 90 MG TABS tablet Take 1 tablet (90 mg total) by mouth 2 (two) times daily.     Allergies:   Ibuprofen; Nsaids; and  Banana   Social History   Tobacco Use   Smoking status: Never Smoker   Smokeless tobacco: Never Used  Substance Use Topics   Alcohol use: Never    Frequency: Never   Drug use: Never     Family Hx: The patient's family history includes Brain cancer in her mother; Heart attack (age of onset: 25) in her father; Lung cancer in her mother; Microcephaly in her mother.  ROS:   Please see the history of present illness.    No fevers chills night sweats or cough Normal breathing pattern No chest pain PND orthopnea or palpitations. No recent dyspepsia No myalgias or arthralgias No skin rash Sleeping well All other systems reviewed and are negative.   Prior CV studies:   The following studies were reviewed today:  I have thoroughly reviewed the patient's initial catheterization and PCI to her RCA from August 02, 2018, as well as her February 9 cardiac catheterization and LAD intervention.  I reviewed the records of [facility].  [facility]  Labs/Other Tests and Data Reviewed:    EKG:  An ECG dated 08/18/2018 was personally reviewed today and demonstrated:  Normal sinus rhythm at 60 bpm.  T wave inversion in 2 3 and F and V2 through V6.  QS complex V1 through V4 consistent with her prior MI  Recent Labs: 07/31/2018: TSH 1.824 08/08/2018: ALT 14 08/09/2018: Hemoglobin 10.5; Platelets 361 08/11/2018: BUN 10; Creatinine, Ser 0.64; Potassium 4.5; Sodium 139   Recent Lipid Panel Lab Results  Component Value Date/Time   CHOL 127 08/08/2018 10:15 AM   TRIG 92 08/08/2018 10:15 AM   HDL 38 (L) 08/08/2018 10:15 AM   CHOLHDL 3.3 08/08/2018 10:15 AM   LDLCALC 71 08/08/2018 10:15 AM    Wt Readings from Last 3 Encounters:  10/26/18 156 lb (70.8 kg)  08/18/18 158 lb 12.8 oz (72 kg)  08/11/18 157 lb (71.2 kg)     Objective:    Vital Signs:  BP 127/64    Pulse 60    Ht 5\' 1"  (1.549 m)    Wt 156 lb (70.8 kg)    BMI 29.48  kg/m    She is well-developed and well-nourished in no acute distress.  Her breathing is normal and nonlabored.   There is no wheezing.   She denies any residual chest discomfort or chest tenderness to palpation.  She denies abdominal discomfort to palpation.   She denies any leg swelling.   Neurologically she states she is fine without abnormality.   She has normal cognition, mood and affect  ASSESSMENT & PLAN:    1. CAD: Status post initial non-STEMI leading to catheterization and PCI of her RCA on August 02, 2018 with subsequent we presented Lynn Hogan on February 9 with acute anterior STEMI secondary to subtotal mid LAD thrombosis.  This occurred while the patient was on aspirin/Plavix raising concern for Plavix responsiveness.  She is now on aspirin and Brilinta.  Recommend continuation of dap for minimum of 1 year and probably longer.  Presently there is no recurrent anginal symptoms. 2. Ischemic cardiomyopathy.  Ejection fraction was 30 to 35% prior to leaving the hospital for which she had worn a LifeVest.  She self discontinued this after 5 weeks of therapy.  Hopefully, with prompt revascularization she has had significant myocardial salvage.  We plan to perform an echo Doppler study in several weeks for reassessment of LV function.  At present she continues to be on lisinopril and Toprol-XL. 3. Essential hypertension: Blood pressure today is excellent in the review of her records during cardiac rehab also show optimal blood pressure control.  She will continue with her current dose of lisinopril and Toprol. 4. Hyperlipidemia: Now on atorvastatin 80 mg with target LDL less than 70.  LDL on re-presentation with her anterior STEMI was 71. 5.   COVID-19 Education: The signs and symptoms of COVID-19 were discussed with the patient and how to seek care for testing (follow up with PCP or arrange E-visit).  The importance of social distancing was discussed today.  Time:   Today, I have spent 25  minutes with the patient with telehealth technology discussing the above problems.     Medication Adjustments/Labs and Tests Ordered: Current medicines are reviewed at length with the patient today.  Concerns regarding medicines are outlined above.   Tests Ordered: No orders of the defined types were placed in this encounter.   Medication Changes: No orders of the defined types were placed in this  encounter.   Disposition:  Follow up 2-3 months  Signed, Nicki Guadalajara, MD  10/26/2018 2:05 PM    [facility]

## 2018-10-26 NOTE — Patient Instructions (Signed)
Medication Instructions:  The current medical regimen is effective;  continue present plan and medications.  If you need a refill on your cardiac medications before your next appointment, please call your pharmacy.   Follow-Up: At [facility], you and your health needs are our priority.  As part of our continuing mission to provide you with exceptional heart care, we have created designated Provider Care Teams.  These Care Teams include your primary Cardiologist (physician) and Advanced Practice Providers (APPs -  Physician Assistants and Nurse Practitioners) who all work together to provide you with the care you need, when you need it. Marland Kitchen Keep follow up appointment with [facility] after the scheduled ECHO appointment.

## 2018-10-26 NOTE — Telephone Encounter (Signed)
Received information via fax for patients visit today.  Thanks!

## 2018-11-08 ENCOUNTER — Telehealth (HOSPITAL_COMMUNITY): Payer: Self-pay

## 2018-11-08 NOTE — Telephone Encounter (Signed)

## 2018-11-08 NOTE — Telephone Encounter (Signed)
Left message to call back for COVID prescreening. 

## 2018-11-11 ENCOUNTER — Other Ambulatory Visit: Payer: Self-pay

## 2018-11-11 ENCOUNTER — Ambulatory Visit (HOSPITAL_COMMUNITY): Payer: Medicare HMO | Attending: Cardiovascular Disease

## 2018-11-11 DIAGNOSIS — I255 Ischemic cardiomyopathy: Secondary | ICD-10-CM

## 2018-11-15 NOTE — Progress Notes (Signed)
The patient has been notified of the result and verbalized understanding.  All questions (if any) were answered. Dorris Fetch, CMA 11/15/2018 4:47 PM

## 2018-11-17 ENCOUNTER — Other Ambulatory Visit: Payer: Self-pay

## 2018-11-17 MED ORDER — LISINOPRIL 5 MG PO TABS: 5.0000 mg | ORAL_TABLET | Freq: Every day | ORAL | 1 refills | Status: DC

## 2018-11-17 MED ORDER — ATORVASTATIN CALCIUM 80 MG PO TABS: 80.0000 mg | ORAL_TABLET | Freq: Every day | ORAL | 6 refills | Status: DC

## 2018-11-18 ENCOUNTER — Telehealth: Payer: Medicare HMO | Admitting: Cardiovascular Disease

## 2018-11-22 ENCOUNTER — Other Ambulatory Visit: Payer: Self-pay | Admitting: Physician Assistant

## 2018-11-24 ENCOUNTER — Other Ambulatory Visit: Payer: Self-pay

## 2018-11-24 MED ORDER — METOPROLOL SUCCINATE ER 50 MG PO TB24: ORAL_TABLET | ORAL | 3 refills | Status: DC

## 2018-11-24 MED ORDER — LISINOPRIL 5 MG PO TABS: 5.0000 mg | ORAL_TABLET | Freq: Every day | ORAL | 1 refills | Status: DC

## 2018-11-24 MED ORDER — TICAGRELOR 90 MG PO TABS: 90.0000 mg | ORAL_TABLET | Freq: Two times a day (BID) | ORAL | 7 refills | Status: DC

## 2018-11-24 MED ORDER — ATORVASTATIN CALCIUM 80 MG PO TABS: 80.0000 mg | ORAL_TABLET | Freq: Every day | ORAL | 3 refills | Status: DC

## 2018-11-25 ENCOUNTER — Other Ambulatory Visit: Payer: Self-pay | Admitting: *Deleted

## 2018-11-25 MED ORDER — ATORVASTATIN CALCIUM 80 MG PO TABS: 80.0000 mg | ORAL_TABLET | Freq: Every day | ORAL | 3 refills | Status: DC

## 2018-11-25 MED ORDER — METOPROLOL SUCCINATE ER 50 MG PO TB24: ORAL_TABLET | ORAL | 3 refills | Status: DC

## 2018-11-25 MED ORDER — LISINOPRIL 5 MG PO TABS: 5.0000 mg | ORAL_TABLET | Freq: Every day | ORAL | 3 refills | Status: DC

## 2018-11-25 MED ORDER — TICAGRELOR 90 MG PO TABS: 90.0000 mg | ORAL_TABLET | Freq: Two times a day (BID) | ORAL | 3 refills | Status: DC

## 2018-11-25 MED ORDER — METOPROLOL SUCCINATE ER 50 MG PO TB24: ORAL_TABLET | ORAL | 1 refills | Status: DC

## 2018-11-29 ENCOUNTER — Other Ambulatory Visit: Payer: Self-pay

## 2018-11-29 MED ORDER — LISINOPRIL 5 MG PO TABS: 5.0000 mg | ORAL_TABLET | Freq: Every day | ORAL | 3 refills | Status: DC

## 2018-11-29 MED ORDER — TICAGRELOR 90 MG PO TABS: 90.0000 mg | ORAL_TABLET | Freq: Two times a day (BID) | ORAL | 3 refills | Status: DC

## 2018-12-06 ENCOUNTER — Encounter: Payer: Self-pay | Admitting: Cardiovascular Disease

## 2018-12-14 ENCOUNTER — Telehealth (INDEPENDENT_AMBULATORY_CARE_PROVIDER_SITE_OTHER): Payer: Medicare HMO | Admitting: Cardiovascular Disease

## 2018-12-14 ENCOUNTER — Encounter: Payer: Self-pay | Admitting: Cardiovascular Disease

## 2018-12-14 VITALS — BP 115/65 | HR 60 | Ht 61.0 in | Wt 158.0 lb

## 2018-12-14 DIAGNOSIS — E785 Hyperlipidemia, unspecified: Secondary | ICD-10-CM | POA: Diagnosis not present

## 2018-12-14 DIAGNOSIS — I2510 Atherosclerotic heart disease of native coronary artery without angina pectoris: Secondary | ICD-10-CM

## 2018-12-14 DIAGNOSIS — R945 Abnormal results of liver function studies: Secondary | ICD-10-CM

## 2018-12-14 DIAGNOSIS — I255 Ischemic cardiomyopathy: Secondary | ICD-10-CM | POA: Diagnosis not present

## 2018-12-14 DIAGNOSIS — I249 Acute ischemic heart disease, unspecified: Secondary | ICD-10-CM

## 2018-12-14 DIAGNOSIS — R7989 Other specified abnormal findings of blood chemistry: Secondary | ICD-10-CM

## 2018-12-14 MED ORDER — EZETIMIBE 10 MG PO TABS: 10.0000 mg | ORAL_TABLET | Freq: Every day | ORAL | 3 refills | Status: DC

## 2018-12-14 MED ORDER — ATORVASTATIN CALCIUM 80 MG PO TABS: 40.0000 mg | ORAL_TABLET | Freq: Every day | ORAL | 3 refills | Status: DC

## 2018-12-14 NOTE — Progress Notes (Signed)
Virtual Visit via Telephone Note   This visit type was conducted due to national recommendations for restrictions regarding the COVID-19 Pandemic (e.g. social distancing) in an effort to limit this patient's exposure and mitigate transmission in our community.  Due to her co-morbid illnesses, this patient is at least at moderate risk for complications without adequate follow up.  This format is felt to be most appropriate for this patient at this time.  The patient did not have access to video technology/had technical difficulties with video requiring transitioning to audio format only (telephone).  All issues noted in this document were discussed and addressed.  No physical exam could be performed with this format.  Please refer to the patient's chart for her  consent to telehealth for [facility].   Date:  12/14/2018   ID:  [name], DOB 11-May-1946, MRN [id number]  Patient Location: Home Provider Location: Office  PCP:  Einar Pheasant, DO  Cardiologist:  Shelva Majestic, MD  Electrophysiologist:  None   Evaluation Performed:  Follow-Up Visit  Chief Complaint:  2 month f/u of CAD/ischemic cardiomyopathy  History of Present Illness:    Lynn Hogan is a 73 y.o. female who suffered a non-ST segment elevation myocardial infarction in a developed initial substernal chest burning which initially she felt was due to acid reflux.  She ultimately presented to [facility].  On August 02, 2018 she underwent initial catheterization and was found to have high-grade segmental tandem proximal to mid RCA stenoses which was felt to be the culprit lesion contributing to her end STEMI.  She underwent insertion of a 3.0 x 38 mm Synergy stent.  She was found to have very proximal diagonal vessel of the LAD that had ostial narrowing of 85 to 90% in a vessel which had a ramus intermediate like distribution.  In addition there was a 55% mid LAD stenosis.  She was discharged the following day on aspirin,  Plavix and amlodipine, atorvastatin and metoprolol.  She had done well but on the morning of August 08, 2018 she developed severe substernal chest pressure.  EMS was contacted and a code STEMI was activated and her ECG showed acute anterior injury current.  Upon arrival to [facility] following initiation of heparin her chest pain was significantly improved but still present.  She was taken acutely to the Cath Lab by me and was found to have subtotal occlusion of the mid LAD at the site of previous 55% stenosis.  She underwent successful stenting with a 3.5 x 20 mm Synergy DES stent postdilated to 3.75 mm in the 99% stenosis reduced to 0%.  There was no change in her previously noted 85% ostial stenosis in the very proximal diagonal vessel.  Her initial post anterior STEMI echo Doppler study showed reduction in EF from her previous echo of 60 to 65% to 30 to 35%.  She ultimately was dischargedOn metoprolol tartrate and lisinopril was fitted for a LifeVest.  Subsequently, she was enrolled in the cardiac rehab program which she currently is undergoing at [facility] location.  I saw her for initial telemedicine evaluation on October 26, 2018.  At that time she was doing well following her intervention and denied any recurrent chest pain or shortness of breath.   She wore the LifeVest for 5 weeks but then became significantly frustrated with it and ultimately turned it back in.She was on aspirin/Brilinta with plans for continuation for minimum of 1 year and probably longer.  I  recommended a follow-up echo Doppler study be obtained since her initial ejection fraction was 30 to 35% prior to leaving the hospital for which she had worn a LifeVest.  She underwent a subsequent echo Doppler study On Nov 11, 2018 which now demonstrated normalization of LV function with an EF of 60 to 65%.  There was mild grade 1 diastolic dysfunction.  There was mild biatrial enlargement, mild mitral annular calcification and  mild thickening of an aortic valve without stenosis.  Over the last several months she has continued to feel well.  She will be graduating from cardiac rehab later this week.  She has undergone lab work by her primary physician in [facility] Dr. Liberty HandyJenny Griffin.  Laboratory on May 19 showed total cholesterol 155 HDL 43 triglycerides 137 and LDL 88.  She had mild transaminase elevation with AST of 55 and ALT at 63.  She tells me this past week she had repeat LFTs and these remained similarly elevated but I do not have the specifics.  She feels well.  She denies shortness of breath PND orthopnea palpitations or chest pain.  She presents for evaluation.   The patient does not have symptoms concerning for COVID-19 infection (fever, chills, cough, or new shortness of breath).    Past Medical History:  Diagnosis Date  . Coronary artery disease   . Hyperlipidemia   . NSTEMI (non-ST elevated myocardial infarction) (HCC)    08/02/2018-PCI/DES to RCA   . STEMI (ST elevation myocardial infarction) (HCC)    08/08/2018-PCI/DES to mLAD, 85% osital stenosis of 1st diag.   Marland Kitchen. Systolic heart failure [facility](HCC)    Past Surgical History:  Procedure Laterality Date  . CARDIAC CATHETERIZATION    . CATARACT EXTRACTION, BILATERAL  02/12/2016  . CORONARY STENT INTERVENTION  08/02/2018  . CORONARY STENT INTERVENTION N/A 08/02/2018   Procedure: CORONARY STENT INTERVENTION;  Surgeon: Yvonne KendallEnd, Christopher, MD;  Location: [facility] INVASIVE CV LAB;  Service: Cardiovascular;  Laterality: N/A;  . CORONARY/GRAFT ACUTE MI REVASCULARIZATION N/A 08/08/2018   Procedure: Coronary/Graft Acute MI Revascularization;  Surgeon: Lennette BihariKelly, Prapti Grussing A, MD;  Location: [facility] INVASIVE CV LAB;  Service: Cardiovascular;  Laterality: N/A;  . HYSTERECTOMY ABDOMINAL WITH SALPINGECTOMY  02/12/1971  . LEFT HEART CATH AND CORONARY ANGIOGRAPHY N/A 08/02/2018   Procedure: LEFT HEART CATH AND CORONARY ANGIOGRAPHY;  Surgeon: Yvonne KendallEnd, Christopher, MD;  Location: [facility] INVASIVE CV LAB;   Service: Cardiovascular;  Laterality: N/A;  . LEFT HEART CATH AND CORONARY ANGIOGRAPHY N/A 08/08/2018   Procedure: LEFT HEART CATH AND CORONARY ANGIOGRAPHY;  Surgeon: Lennette BihariKelly, Gaby Harney A, MD;  Location: [facility] INVASIVE CV LAB;  Service: Cardiovascular;  Laterality: N/A;     Current Meds  Medication Sig  . acetaminophen (TYLENOL) 325 MG suppository Place 325 mg rectally every 4 (four) hours as needed.  Marland Kitchen. aspirin EC 81 MG EC tablet Take 1 tablet (81 mg total) by mouth daily.  Marland Kitchen. atorvastatin (LIPITOR) 80 MG tablet Take 1 tablet (80 mg total) by mouth daily at 6 PM.  . lisinopril (ZESTRIL) 5 MG tablet Take 1 tablet (5 mg total) by mouth daily.  . metoprolol succinate (TOPROL-XL) 50 MG 24 hr tablet TAKE ONE TABLET BY MOUTH DAILY WITH OR IMMEDIATELY FOLLOWING A MEAL  . ticagrelor (BRILINTA) 90 MG TABS tablet Take 1 tablet (90 mg total) by mouth 2 (two) times daily.     Allergies:   Ibuprofen, Nsaids, and Banana   Social History   Tobacco Use  . Smoking status: Never Smoker  . Smokeless  tobacco: Never Used  Substance Use Topics  . Alcohol use: Never    Frequency: Never  . Drug use: Never     Family Hx: The patient's family history includes Brain cancer in her mother; Heart attack (age of onset: 9255) in her father; Lung cancer in her mother; Microcephaly in her mother.  ROS:   Please see the history of present illness.    No fevers chills night sweats No cough, change in smell or taste No wheezing No awareness of palpitations, chest pain PND orthopnea No abdominal tenderness nausea or vomiting No leg swelling No myalgias Sleeping well All other systems reviewed and are negative.   Prior CV studies:   The following studies were reviewed today:  11/11/2018 ECHO IMPRESSIONS  1. The left ventricle has normal systolic function with an ejection fraction of 60-65%. The cavity size was normal. There is mildly increased left ventricular wall thickness. Left ventricular diastolic Doppler  parameters are consistent with impaired  relaxation.  2. The right ventricle has normal systolic function. The cavity was normal.  3. Left atrial size was mildly dilated.  4. Right atrial size was mildly dilated.  5. The mitral valve is grossly normal. There is mild mitral annular calcification present.  6. The tricuspid valve is grossly normal.  7. The aortic valve is tricuspid. Mild thickening of the aortic valve. No stenosis of the aortic valve.  8. Normal LV systolic function; mild LVH; mild diastolic dysfunction; mild biatrial enlargement; mild MR and TR.   Labs/Other Tests and Data Reviewed:    EKG:  An ECG dated 08/18/2018 was personally reviewed today and demonstrated:   Normal sinus rhythm at 60 bpm.  T wave inversion in 2 3 and F and V2 through V6.  QS complex V1 through V4 consistent with her prior MI   Recent Labs: 07/31/2018: TSH 1.824 08/08/2018: ALT 14 08/09/2018: Hemoglobin 10.5; Platelets 361 08/11/2018: BUN 10; Creatinine, Ser 0.64; Potassium 4.5; Sodium 139   Recent Lipid Panel Lab Results  Component Value Date/Time   CHOL 127 08/08/2018 10:15 AM   TRIG 92 08/08/2018 10:15 AM   HDL 38 (L) 08/08/2018 10:15 AM   CHOLHDL 3.3 08/08/2018 10:15 AM   LDLCALC 71 08/08/2018 10:15 AM    Wt Readings from Last 3 Encounters:  12/14/18 158 lb (71.7 kg)  10/26/18 156 lb (70.8 kg)  08/18/18 158 lb 12.8 oz (72 kg)     Objective:    Vital Signs:  BP 115/65   Pulse 60   Ht 5\' 1"  (1.549 m)   Wt 158 lb (71.7 kg)   BMI 29.85 kg/m    Since this was a phone visit I could not visually see the patient. She is well-developed and well-nourished and denied any change in physical appearance. Her breathing was normal.  There was no wheezing She denied any chest discomfort or tenderness to palpation Her heart rhythm by palpation was regular and normal She denied any leg swelling Neurologically she was without abnormality She has normal mood and affect   ASSESSMENT & PLAN:     1. CAD: Status post initial non-STEMI leading to catheterization and PCI of her RCA on August 02, 2018 with subsequently re-presented on February 9 with acute anterior STEMI secondary to subtotal mid LAD thrombosis.  This occurred while the patient was on aspirin/Plavix raising concern for Plavix responsiveness.  She on aspirin and Brilinta, and is tolerating this well without bleeding.  She remains asymptomatic without recurrent anginal symptoms.  2.  Initial ischemic cardiomyopathy: She has been maintained on lisinopril and Toprol.  She had early reperfusion following her LAD stent.  Her most recent echo shows normalization of LV function with EF now at 60 to 65% improved from 30 to 35% prior to leaving to the hospital for which she had temporarily worn a LifeVest. 3. Essential hypertension: Blood pressure today is excellent on her current medical regimen consisting of lisinopril 5 mg and Toprol-XL 50 mg 4. Hyperlipidemia: She has been on atorvastatin 80 mg with target LDL less than 70.  Most recent laboratory from Dr. Valentina LucksGriffin was reviewed and her LDL cholesterol was 88.  However, transaminases were mildly increased with an AST of 55 and ALT at 63 almost 2 times normal.  For this reason, I am recommending she decrease atorvastatin to 40 mg and will initiate Zetia 10 mg in the hope to reach target LDL less than 70.  She will require follow-up laboratory to see if LFTs improved.  If LFTs remain elevated necessitating further dose reduction of statin therapy she may be a candidate for PCSK9 inhibition for optimal treatment. 5. LFT elevation: As noted above with elevation of AST at 55 and ALT at 63.  Repeat laboratory this week reportedly continue to show LFT elevation.  As noted above atorvastatin dose will be decreased but Zetia 10 mg will be added to her medical regimen.  There is no EtOH use.   COVID-19 Education: The signs and symptoms of COVID-19 were discussed with the patient and how to seek care for  testing (follow up with PCP or arrange E-visit).  The importance of social distancing was discussed today.  Time:   Today, I have spent 25 minutes with the patient with telehealth technology discussing the above problems.     Medication Adjustments/Labs and Tests Ordered: Current medicines are reviewed at length with the patient today.  Concerns regarding medicines are outlined above.   Tests Ordered: No orders of the defined types were placed in this encounter.   Medication Changes: No orders of the defined types were placed in this encounter.   Follow Up: She will have follow-up laboratory with her primary MD in 6 to 8 weeks.  I will see her in 3 months for reevaluation.  Signed, Nicki Guadalajarahomas Gurfateh Mcclain, MD  12/14/2018 3:48 PM    [facility]

## 2018-12-14 NOTE — Patient Instructions (Signed)
Medication Instructions:  Cut Atorvastatin in half 0.5 tablet (40 mg)  Start Zetia 10 mg  If you need a refill on your cardiac medications before your next appointment, please call your pharmacy.   Follow-Up: At [facility], you and your health needs are our priority.  As part of our continuing mission to provide you with exceptional heart care, we have created designated Provider Care Teams.  These Care Teams include your primary Cardiologist (physician) and Advanced Practice Providers (APPs -  Physician Assistants and Nurse Practitioners) who all work together to provide you with the care you need, when you need it. You will need a follow up appointment in 3-4 months. You may see Shelva Majestic, MD or one of the following Advanced Practice Providers on your designated Care Team: [place], [place] . Fabian Sharp, PA-C

## 2018-12-27 NOTE — Telephone Encounter (Signed)
Opened in error

## 2019-03-04 ENCOUNTER — Telehealth: Payer: Self-pay | Admitting: Cardiovascular Disease

## 2019-03-04 NOTE — Telephone Encounter (Signed)
The patient called her PCP and wanted to know if she could have her labs for Dr. Claiborne Billings drawn for her October appt done at their office to save a trip. The PCP office contacted [place] to check what labs Dr. Claiborne Billings would want, and there were no current orders.  If Dr. Claiborne Billings wants Dr. Laurann Montana to draw labs on the patient, please fax the orders to their office at [phone]. They are not on Epic

## 2019-03-04 NOTE — Telephone Encounter (Signed)
Dr Claiborne Billings did not request any labs to be done at last office visit Only mentioned that PCP was drawing labs in 6-8 weeks./cy

## 2019-06-14 ENCOUNTER — Telehealth: Payer: Medicare HMO | Admitting: Cardiovascular Disease

## 2019-07-05 ENCOUNTER — Telehealth: Payer: Self-pay

## 2019-07-05 ENCOUNTER — Telehealth (INDEPENDENT_AMBULATORY_CARE_PROVIDER_SITE_OTHER): Payer: Medicare HMO | Admitting: Physician Assistant

## 2019-07-05 ENCOUNTER — Encounter: Payer: Self-pay | Admitting: Physician Assistant

## 2019-07-05 VITALS — BP 124/74 | HR 58 | Ht 63.0 in | Wt 161.0 lb

## 2019-07-05 DIAGNOSIS — E785 Hyperlipidemia, unspecified: Secondary | ICD-10-CM

## 2019-07-05 DIAGNOSIS — I2510 Atherosclerotic heart disease of native coronary artery without angina pectoris: Secondary | ICD-10-CM

## 2019-07-05 DIAGNOSIS — I255 Ischemic cardiomyopathy: Secondary | ICD-10-CM

## 2019-07-05 MED ORDER — TICAGRELOR 60 MG PO TABS: 60.0000 mg | ORAL_TABLET | Freq: Two times a day (BID) | ORAL | 3 refills | Status: DC

## 2019-07-05 NOTE — Telephone Encounter (Signed)
Contacted patient to discuss AVS Instructions. Gave patient Hao's recommendations from today's virtual office visit. Informed patient that someone from the scheduling dept will be in contact with them to schedule their follow up appt. Patient voiced understanding and AVS mailed.    

## 2019-07-05 NOTE — Telephone Encounter (Signed)

## 2019-07-05 NOTE — Progress Notes (Signed)
Virtual Visit via Telephone Note   This visit type was conducted due to national recommendations for restrictions regarding the COVID-19 Pandemic (e.g. social distancing) in an effort to limit this patient's exposure and mitigate transmission in our community.  Due to her co-morbid illnesses, this patient is at least at moderate risk for complications without adequate follow up.  This format is felt to be most appropriate for this patient at this time.  The patient did not have access to video technology/had technical difficulties with video requiring transitioning to audio format only (telephone).  All issues noted in this document were discussed and addressed.  No physical exam could be performed with this format.  Please refer to the patient's chart for her  consent to telehealth for [facility].   Date:  07/07/2019   ID:  Lynn Hogan, DOB [birth year], MRN [id number]  Patient Location: Home Provider Location: Office  PCP:  Si Gaul, DO  Cardiologist:  Nicki Guadalajara, MD  Electrophysiologist:  None   Evaluation Performed:  Follow-Up Visit  Chief Complaint:  followup  History of Present Illness:    Lynn Hogan is a 74 y.o. female with PMH of CAD, HLD, and chronic systolic heart failure.  Patient was admitted for NSTEMI in February 2020, initial cardiac catheterization found a high-grade segmental tandem proximal and mid RCA stenosis, this was treated with a 3.0 x 38 mm Synergy stent.  She was also found to have 85 to 90% stenosis in proximal diagonal and 55% mid LAD stenosis.  They were managed medically, she was eventually discharged on aspirin, Plavix and high-dose statin.  Unfortunately, she returned to the hospital a few days later with severe chest pain and was diagnosed with STEMI.  Repeat cardiac catheterization demonstrated subtotal occlusion of mid LAD at the site of previous 55% stenosis, she underwent successful stenting with a 3.5 x 20 mm Synergy DES.  There was no change  in the degree of her previous diagonal lesion.  EF has dropped to 30 to 35% after the anterior STEMI and she was ultimately discharged on LifeVest.  She returned the LifeVest after only 5 weeks due to frustration with wearing it.  Repeat echocardiogram in May 2020 now demonstrated EF of 60 to 65%, grade 1 DD.  Due to worsening LFT, her Lipitor was reduced to 40 mg daily.  Patient presents today for virtual visit.  She was contacted via D[employer] app. She is near the end of her 1 year Brilinta therapy.  Last PCI was performed on 08/11/2018.  We discussed 3 different options, including discontinue the Brilinta and continue on aspirin lifelong, or switch the Brilinta to Plavix, or decrease Brilinta to a 60 mg twice daily of maintenance therapy.  She is very hesitant to try Plavix as she did not have very good experience between her 2 heart attacks in February 2020.  She opted to do the lower dose of Brilinta at 60 mg twice daily.  Otherwise she has been quite active and denies any exertional chest pain or shortness of breath.  She has no lower extremity edema, orthopnea or PND.  She recently obtained lab work at her PCPs office, I will follow up on this lab work.   The patient does not have symptoms concerning for COVID-19 infection (fever, chills, cough, or new shortness of breath).    Past Medical History:  Diagnosis Date  . Coronary artery disease   . Hyperlipidemia   . NSTEMI (non-ST elevated myocardial infarction) (HCC)  08/02/2018-PCI/DES to RCA   . STEMI (ST elevation myocardial infarction) (HCC)    08/08/2018-PCI/DES to mLAD, 85% osital stenosis of 1st diag.   Marland Kitchen Systolic heart failure [facility])    Past Surgical History:  Procedure Laterality Date  . CARDIAC CATHETERIZATION    . CATARACT EXTRACTION, BILATERAL  02/12/2016  . CORONARY STENT INTERVENTION  08/02/2018  . CORONARY STENT INTERVENTION N/A 08/02/2018   Procedure: CORONARY STENT INTERVENTION;  Surgeon: Yvonne Kendall, MD;  Location: [facility]  INVASIVE CV LAB;  Service: Cardiovascular;  Laterality: N/A;  . CORONARY/GRAFT ACUTE MI REVASCULARIZATION N/A 08/08/2018   Procedure: Coronary/Graft Acute MI Revascularization;  Surgeon: [name], MD;  Location: [facility] INVASIVE CV LAB;  Service: Cardiovascular;  Laterality: N/A;  . HYSTERECTOMY ABDOMINAL WITH SALPINGECTOMY  02/12/1971  . LEFT HEART CATH AND CORONARY ANGIOGRAPHY N/A 08/02/2018   Procedure: LEFT HEART CATH AND CORONARY ANGIOGRAPHY;  Surgeon: Yvonne Kendall, MD;  Location: [facility] INVASIVE CV LAB;  Service: Cardiovascular;  Laterality: N/A;  . LEFT HEART CATH AND CORONARY ANGIOGRAPHY N/A 08/08/2018   Procedure: LEFT HEART CATH AND CORONARY ANGIOGRAPHY;  Surgeon: [name], MD;  Location: [facility] INVASIVE CV LAB;  Service: Cardiovascular;  Laterality: N/A;     Current Meds  Medication Sig  . acetaminophen (TYLENOL) 325 MG tablet Take 325 mg by mouth every 6 (six) hours as needed.  Marland Kitchen aspirin EC 81 MG EC tablet Take 1 tablet (81 mg total) by mouth daily.  Marland Kitchen atorvastatin (LIPITOR) 80 MG tablet Take 0.5 tablets (40 mg total) by mouth daily at 6 PM.  . ezetimibe (ZETIA) 10 MG tablet Take 1 tablet (10 mg total) by mouth daily.  Marland Kitchen lisinopril (ZESTRIL) 5 MG tablet Take 1 tablet (5 mg total) by mouth daily.  . metoprolol succinate (TOPROL-XL) 50 MG 24 hr tablet TAKE ONE TABLET BY MOUTH DAILY WITH OR IMMEDIATELY FOLLOWING A MEAL  . nitroGLYCERIN (NITROSTAT) 0.4 MG SL tablet Place 1 tablet (0.4 mg total) under the tongue every 5 (five) minutes x 3 doses as needed for chest pain.  . ticagrelor (BRILINTA) 90 MG TABS tablet Take 1 tablet (90 mg total) by mouth 2 (two) times daily.     Allergies:   Ibuprofen, Nsaids, and Banana   Social History   Tobacco Use  . Smoking status: Never Smoker  . Smokeless tobacco: Never Used  Substance Use Topics  . Alcohol use: Never  . Drug use: Never     Family Hx: The patient's family history includes Brain cancer in her mother; Heart attack (age of  onset: 55) in her father; Lung cancer in her mother; Microcephaly in her mother.  ROS:   Please see the history of present illness.     All other systems reviewed and are negative.   Prior CV studies:   The following studies were reviewed today:  Cath 08/02/2018 Conclusions: 1. Significant two-vessel coronary artery disease with 50-60% mid LAD, 80-90% ostial D1, and sequential 70% and 90% proximal/mid RCA stenoses. 2. Upper normal to mildly elevated LVEDP with vigorous left ventricular contraction. 3. Successful PCI to proximal/mid RCA using Synergy 3.0 x 38 mm DES with 0% residual stenosis and TIMI-3 flow.  Recommendations: 1. Aggressive secondary prevention. 2. Dual antiplatelet therapy with aspirin and clopidogrel for at least 12 months. 3. Medical therapy of residual LAD/diagonal disease.  If Ms. Tremper has recurrent angina, PCI to D1 could be considered (though risk is increased by true ostium involvement and potential for affecting the proximal LAD.  Cath 08/08/2018  Ost Ramus lesion is 85% stenosed.  Prox LAD to Mid LAD lesion is 99% stenosed.  Previously placed Prox RCA to Mid RCA stent (unknown type) is widely patent.  Post intervention, there is a 0% residual stenosis.  A stent was successfully placed.   Acute anterior ST segment elevation myocardial infarction secondary to subtotal occlusion of the mid LAD at site of previous 55% stenosis from catheterization in August 02, 2018.  85% ostial stenosis and a very proximal first diagonal vessel (ramus intermediate like vessel not significantly changed from the 08/02/2018 evaluation.  Normal left circumflex coronary artery.  Widely patent proximal to mid RCA stent  LVEDP 30 mm.  Successful PCI with DES stenting with a 3.5 x 20 mm Synergy DES stent postdilated to 3.75 mm with the 99% stenosis to 0%.  RECOMMENDATION: This patient developed acute coronary syndrome/anterior STEMI 6 days following initiation of  Plavix following her recent non-STEMI and PCI to her RCA.  As result DAPT will be transitioned to Brilinta/aspirin.   Will obtain 2D echo Doppler study.  Will have colleagues relook at films regarding continue aggressive medical therapy trial for her ostial diagonal/ramus like vessel of at least 85% stenosis versus attempt at staged possible PCI with cutting balloon.   Echo 11/11/2018 IMPRESSIONS    1. The left ventricle has normal systolic function with an ejection fraction of 60-65%. The cavity size was normal. There is mildly increased left ventricular wall thickness. Left ventricular diastolic Doppler parameters are consistent with impaired  relaxation.  2. The right ventricle has normal systolic function. The cavity was normal.  3. Left atrial size was mildly dilated.  4. Right atrial size was mildly dilated.  5. The mitral valve is grossly normal. There is mild mitral annular calcification present.  6. The tricuspid valve is grossly normal.  7. The aortic valve is tricuspid. Mild thickening of the aortic valve. No stenosis of the aortic valve.  8. Normal LV systolic function; mild LVH; mild diastolic dysfunction; mild biatrial enlargement; mild MR and TR.  Labs/Other Tests and Data Reviewed:    EKG:  An ECG dated 08/18/2018 was personally reviewed today and demonstrated:  Normal sinus rhythm, Q waves in the inferior and anterior leads.  Recent Labs: 07/31/2018: TSH 1.824 08/08/2018: ALT 14 08/09/2018: Hemoglobin 10.5; Platelets 361 08/11/2018: BUN 10; Creatinine, Ser 0.64; Potassium 4.5; Sodium 139   Recent Lipid Panel Lab Results  Component Value Date/Time   CHOL 127 08/08/2018 10:15 AM   TRIG 92 08/08/2018 10:15 AM   HDL 38 (L) 08/08/2018 10:15 AM   CHOLHDL 3.3 08/08/2018 10:15 AM   LDLCALC 71 08/08/2018 10:15 AM    Wt Readings from Last 3 Encounters:  07/05/19 161 lb (73 kg)  12/14/18 158 lb (71.7 kg)  10/26/18 156 lb (70.8 kg)     Objective:    Vital Signs:  BP 124/74    Pulse (!) 58   Ht 5\' 3"  (1.6 m)   Wt 161 lb (73 kg)   BMI 28.52 kg/m    VITAL SIGNS:  reviewed  ASSESSMENT & PLAN:    1. CAD: Denies any recent chest pain.  We will switch Brilinta to 60 mg twice daily maintenance dose.  Continue aspirin.    2. Ischemic cardiomyopathy with improved EF: Last echocardiogram showed normalization of ejection fraction.  3. Hyperlipidemia: On Lipitor 40 mg daily.   COVID-19 Education: The signs and symptoms of COVID-19 were discussed with the patient and how to seek  care for testing (follow up with PCP or arrange E-visit).  The importance of social distancing was discussed today.  Time:   Today, I have spent 20 minutes with the patient with telehealth technology discussing the above problems.     Medication Adjustments/Labs and Tests Ordered: Current medicines are reviewed at length with the patient today.  Concerns regarding medicines are outlined above.   Tests Ordered: No orders of the defined types were placed in this encounter.   Medication Changes: Meds ordered this encounter  Medications  . ticagrelor (BRILINTA) 60 MG TABS tablet    Sig: Take 1 tablet (60 mg total) by mouth 2 (two) times daily. DO NOT START UNTIL 08/12/2019    Dispense:  60 tablet    Refill:  3    PATIENT IS NOT TO START MEDICATION UNTIL 08/12/2019    Follow Up:  Either In Person or Virtual in 6 month(s)  Signed, Almyra Deforest, [place]  07/07/2019 8:16 PM    [facility]

## 2019-07-05 NOTE — Patient Instructions (Signed)
Medication Instructions:  ON 08/12/2019 DECREASE YOUR BRILINTA TO 60MG  TAKE 1 TABLET TWICE A DAY  *If you need a refill on your cardiac medications before your next appointment, please call your pharmacy*  Lab Work: NONE ( we have requested labs from your pcp)  If you have labs (blood work) drawn today and your tests are completely normal, you will receive your results only by: MyChart Message (if you have MyChart) OR . A paper copy in the mail If you have any lab test that is abnormal or we need to change your treatment, we will call you to review the results.  Testing/Procedures: None   Follow-Up: At [facility], you and your health needs are our priority.  As part of our continuing mission to provide you with exceptional heart care, we have created designated Provider Care Teams.  These Care Teams include your primary Cardiologist (physician) and Advanced Practice Providers (APPs -  Physician Assistants and Nurse Practitioners) who all work together to provide you with the care you need, when you need it.  Your next appointment:   6 month(s)  The format for your next appointment:   In Person  Provider:   [facility], MD  Other Instructions

## 2019-07-14 ENCOUNTER — Other Ambulatory Visit: Payer: Self-pay

## 2019-07-15 ENCOUNTER — Other Ambulatory Visit: Payer: Self-pay

## 2019-07-15 MED ORDER — TICAGRELOR 60 MG PO TABS: 60.0000 mg | ORAL_TABLET | Freq: Two times a day (BID) | ORAL | 3 refills | Status: DC

## 2019-07-15 MED ORDER — EZETIMIBE 10 MG PO TABS: 10.0000 mg | ORAL_TABLET | Freq: Every day | ORAL | 3 refills | Status: DC

## 2019-07-25 ENCOUNTER — Other Ambulatory Visit: Payer: Self-pay

## 2019-09-20 ENCOUNTER — Other Ambulatory Visit: Payer: Self-pay | Admitting: Cardiovascular Disease

## 2019-10-06 ENCOUNTER — Other Ambulatory Visit: Payer: Self-pay | Admitting: Physician Assistant

## 2019-12-03 ENCOUNTER — Other Ambulatory Visit: Payer: Self-pay | Admitting: Physician Assistant

## 2019-12-10 ENCOUNTER — Other Ambulatory Visit: Payer: Self-pay | Admitting: Cardiovascular Disease

## 2019-12-19 MED ORDER — TICAGRELOR 60 MG PO TABS: 60.0000 mg | ORAL_TABLET | Freq: Two times a day (BID) | ORAL | 2 refills | Status: DC

## 2020-01-10 ENCOUNTER — Ambulatory Visit: Payer: Medicare HMO | Admitting: Physician Assistant

## 2020-01-30 ENCOUNTER — Telehealth: Payer: Self-pay | Admitting: Cardiovascular Disease

## 2020-01-30 NOTE — Telephone Encounter (Signed)
  Pt is calling to follow up her mychart question. I gave her covid vaccine dot phrase, but she is still concern since she is taking brilinta.  Please advise

## 2020-01-30 NOTE — Telephone Encounter (Signed)
Called patient, she was advised of the dot phrase that cardiologist are recommending to get the vaccine and being on this medication is okay to receive.  Patient thankful for call back.

## 2020-03-16 ENCOUNTER — Ambulatory Visit: Payer: Medicare HMO | Admitting: Physician Assistant

## 2020-04-28 ENCOUNTER — Other Ambulatory Visit: Payer: Self-pay | Admitting: Cardiovascular Disease

## 2020-05-09 ENCOUNTER — Telehealth: Payer: Medicare HMO | Admitting: Physician Assistant

## 2020-05-10 ENCOUNTER — Ambulatory Visit: Payer: Medicare HMO | Admitting: Physician Assistant

## 2020-05-21 ENCOUNTER — Other Ambulatory Visit: Payer: Self-pay | Admitting: Physician Assistant

## 2020-05-21 ENCOUNTER — Telehealth (INDEPENDENT_AMBULATORY_CARE_PROVIDER_SITE_OTHER): Payer: Medicare HMO | Admitting: Physician Assistant

## 2020-05-21 ENCOUNTER — Encounter: Payer: Self-pay | Admitting: Physician Assistant

## 2020-05-21 VITALS — BP 116/74 | HR 58 | Ht 61.0 in | Wt 158.0 lb

## 2020-05-21 DIAGNOSIS — I255 Ischemic cardiomyopathy: Secondary | ICD-10-CM | POA: Diagnosis not present

## 2020-05-21 DIAGNOSIS — E785 Hyperlipidemia, unspecified: Secondary | ICD-10-CM | POA: Diagnosis not present

## 2020-05-21 DIAGNOSIS — I2510 Atherosclerotic heart disease of native coronary artery without angina pectoris: Secondary | ICD-10-CM

## 2020-05-21 MED ORDER — ATORVASTATIN CALCIUM 80 MG PO TABS: 80.0000 mg | ORAL_TABLET | Freq: Every day | ORAL | 3 refills | Status: DC

## 2020-05-21 MED ORDER — METOPROLOL SUCCINATE ER 50 MG PO TB24: ORAL_TABLET | ORAL | 3 refills | Status: DC

## 2020-05-21 MED ORDER — LISINOPRIL 2.5 MG PO TABS: 2.5000 mg | ORAL_TABLET | Freq: Every day | ORAL | 3 refills | Status: DC

## 2020-05-21 MED ORDER — NITROGLYCERIN 0.4 MG SL SUBL: 0.4000 mg | SUBLINGUAL_TABLET | SUBLINGUAL | 3 refills | Status: DC | PRN

## 2020-05-21 MED ORDER — EZETIMIBE 10 MG PO TABS: 10.0000 mg | ORAL_TABLET | Freq: Every day | ORAL | 3 refills | Status: DC

## 2020-05-21 NOTE — Patient Instructions (Signed)
Medication Instructions:  DECREASE- Lisinopril 2.5 mg by mouth daily  *If you need a refill on your cardiac medications before your next appointment, please call your pharmacy*   Lab Work: None ordered  Testing/Procedures: None Ordered   Follow-Up: At [facility], you and your health needs are our priority.  As part of our continuing mission to provide you with exceptional heart care, we have created designated Provider Care Teams.  These Care Teams include your primary Cardiologist (physician) and Advanced Practice Providers (APPs -  Physician Assistants and Nurse Practitioners) who all work together to provide you with the care you need, when you need it.  We recommend signing up for the patient portal called "MyChart".  Sign up information is provided on this After Visit Summary.  MyChart is used to connect with patients for Virtual Visits (Telemedicine).  Patients are able to view lab/test results, encounter notes, upcoming appointments, etc.  Non-urgent messages can be sent to your provider as well.   To learn more about what you can do with MyChart, go to [email or web address].    Your next appointment:   6 month(s)  The format for your next appointment:   In Person  Provider:   You may see Nicki Guadalajara, MD or one of the following Advanced Practice Providers on your designated Care Team:    Azalee Course, PA-C  Micah Flesher, PA-C or   Judy Pimple, [place]

## 2020-05-21 NOTE — Progress Notes (Signed)
Virtual Visit via Telephone Note   This visit type was conducted due to national recommendations for restrictions regarding the COVID-19 Pandemic (e.g. social distancing) in an effort to limit this patient's exposure and mitigate transmission in our community.  Due to her co-morbid illnesses, this patient is at least at moderate risk for complications without adequate follow up.  This format is felt to be most appropriate for this patient at this time.  The patient did not have access to video technology/had technical difficulties with video requiring transitioning to audio format only (telephone).  All issues noted in this document were discussed and addressed.  No physical exam could be performed with this format.  Please refer to the patient's chart for her  consent to telehealth for [facility].    Date:  05/23/2020   ID:  Lynn Hogan, DOB September 11, 1945, MRN [id number] The patient was identified using 2 identifiers.  Patient Location: Home Provider Location: Office/Clinic  PCP:  Lynn Gaul, DO  Cardiologist:  Lynn Guadalajara, MD  Electrophysiologist:  None   Evaluation Performed:  Follow-Up Visit  Chief Complaint:  Follow up  History of Present Illness:    Lynn Hogan is a 74 y.o. female with PMH of CAD, HLD, and ischemic cardiomyopathy with improved EF.  Patient was admitted for NSTEMI in February 2020, initial cardiac catheterization found a high-grade segmental tandem proximal and mid RCA stenosis, this was treated with a 3.0 x 38 mm Synergy stent.  She was also found to have 85 to 90% stenosis in proximal diagonal and 55% mid LAD stenosis.  They were managed medically, she was eventually discharged on aspirin, Plavix and high-dose statin.  Unfortunately, she returned to the hospital a few days later with severe chest pain and was diagnosed with STEMI.  Repeat cardiac catheterization demonstrated subtotal occlusion of mid LAD at the site of previous 55% stenosis, she underwent  successful stenting with a 3.5 x 20 mm Synergy DES.  There was no change in the degree of her previous diagonal lesion.  EF has dropped to 30 to 35% after the anterior STEMI and she was ultimately discharged on LifeVest.  She returned the LifeVest after only 5 weeks due to frustration with wearing it.  Repeat echocardiogram in May 2020 now demonstrated EF of 60 to 65%, grade 1 DD.  Due to worsening LFT, her Lipitor was reduced to 40 mg daily.  I last saw the patient virtually in January 2021 at which time she was near the end of her 1 year preventive therapy.  Her Brilinta was decreased to 60 mg twice daily maintenance therapy.  She has been doing well since January, and has not had any chest pain or shortness of breath.  Her daughter is a Advice worker.  She still has occasional bruising in her arms, however no significant bleeding on the current dose of Brilinta.  She is planning to see her PCP who will obtain her annual lab work.  Previous lab work showed borderline LDL and triglyceride.  The only issue she has is with the current medication she does feel dizzy and sleepy after taking her morning lisinopril, her blood pressure dropped down to the low 100 range.  I recommended reducing the lisinopril to 2.5 mg daily.  The patient does not have symptoms concerning for COVID-19 infection (fever, chills, cough, or new shortness of breath).    Past Medical History:  Diagnosis Date  . Coronary artery disease   . Hyperlipidemia   . NSTEMI (  non-ST elevated myocardial infarction) (HCC)    08/02/2018-PCI/DES to RCA   . STEMI (ST elevation myocardial infarction) (HCC)    08/08/2018-PCI/DES to mLAD, 85% osital stenosis of 1st diag.   Marland Kitchen Systolic heart failure [facility])    Past Surgical History:  Procedure Laterality Date  . CARDIAC CATHETERIZATION    . CATARACT EXTRACTION, BILATERAL  02/12/2016  . CORONARY STENT INTERVENTION  08/02/2018  . CORONARY STENT INTERVENTION N/A 08/02/2018   Procedure: CORONARY  STENT INTERVENTION;  Surgeon: Yvonne Kendall, MD;  [facility] INV[facility] LAB;  Service: Cardiovascular;  Laterality: N/A;  . CORONARY/GRAFT ACUTE MI REVASCULARIZATION N/A 08/08/2018   Procedure: Coronary/Graft Acute MI Revascularization;  Surgeon: [name], MD;  Location: [facility] INVASIVE CV LAB;  Service: Cardiovascular;  Laterality: N/A;  . HYSTERECTOMY ABDOMINAL WITH SALPINGECTOMY  02/12/1971  . LEFT HEART CATH AND CORONARY ANGIOGRAPHY N/A 08/02/2018   Procedure: LEFT HEART CATH AND CORONARY ANGIOGRAPHY;  Surgeon: Yvonne Kendall, MD;  Location: [facility] INVASIVE CV LAB;  Service: Cardiovascular;  Laterality: N/A;  . LEFT HEART CATH AND CORONARY ANGIOGRAPHY N/A 08/08/2018   Procedure: LEFT HEART CATH AND CORONARY ANGIOGRAPHY;  Surgeon: [name], MD;  Location: [facility] INVAS[facility] CV LAB;  Service: Cardiovascular;  Laterality: N/A;     Current Meds  Medication Sig  . acetaminophen (TYLENOL) 325 MG tablet Take 325 mg by mouth every 6 (six) hours as needed.  Marland Kitchen aspirin EC 81 MG EC tablet Take 1 tablet (81 mg total) by mouth daily.  Marland Kitchen atorvastatin (LIPITOR) 80 MG tablet Take 1 tablet (80 mg total) by mouth daily.  Marland Kitchen ezetimibe (ZETIA) 10 MG tablet Take 1 tablet (10 mg total) by mouth daily.  Marland Kitchen lisinopril (ZESTRIL) 2.5 MG tablet Take 1 tablet (2.5 mg total) by mouth daily.  . metoprolol succinate (TOPROL-XL) 50 MG 24 hr tablet Take with or immediately following a meal.  . ticagrelor (BRILINTA) 60 MG TABS tablet Take 1 tablet (60 mg total) by mouth 2 (two) times daily.  . [DISCONTINUED] atorvastatin (LIPITOR) 80 MG tablet TAKE 1 TABLET (80 MG TOTAL) BY MOUTH DAILY AT 6 PM.  . [DISCONTINUED] ezetimibe (ZETIA) 10 MG tablet TAKE 1 TABLET EVERY DAY  . [DISCONTINUED] lisinopril (ZESTRIL) 5 MG tablet TAKE 1 TABLET (5 MG TOTAL) BY MOUTH DAILY.  . [DISCONTINUED] metoprolol succinate (TOPROL-XL) 50 MG 24 hr tablet TAKE ONE TABLET BY MOUTH DAILY WITH OR IMMEDIATELY FOLLOWING A MEAL     Allergies:   Ibuprofen,  Nsaids, and Banana   Social History   Tobacco Use  . Smoking status: Never Smoker  . Smokeless tobacco: Never Used  Vaping Use  . Vaping Use: Never used  Substance Use Topics  . Alcohol use: Never  . Drug use: Never     Family Hx: The patient's family history includes Brain cancer in her mother; Heart attack (age of onset: 24) in her father; Lung cancer in her mother; Microcephaly in her mother.  ROS:   Please see the history of present illness.     All other systems reviewed and are negative.   Prior CV studies:   The following studies were reviewed today:  Echo 11/11/2018 1. The left ventricle has normal systolic function with an ejection  fraction of 60-65%. The cavity size was normal. There is mildly increased  left ventricular wall thickness. Left ventricular diastolic Doppler  parameters are consistent with impaired  relaxation.  2. The right ventricle has normal systolic function. The cavity was  normal.  3.  Left atrial size was mildly dilated.  4. Right atrial size was mildly dilated.  5. The mitral valve is grossly normal. There is mild mitral annular  calcification present.  6. The tricuspid valve is grossly normal.  7. The aortic valve is tricuspid. Mild thickening of the aortic valve. No  stenosis of the aortic valve.  8. Normal LV systolic function; mild LVH; mild diastolic dysfunction;  mild biatrial enlargement; mild MR and TR.   Labs/Other Tests and Data Reviewed:    EKG:  No ECG reviewed.  Recent Labs: No results found for requested labs within last 8760 hours.   Recent Lipid Panel Lab Results  Component Value Date/Time   CHOL 127 08/08/2018 10:15 AM   TRIG 92 08/08/2018 10:15 AM   HDL 38 (L) 08/08/2018 10:15 AM   CHOLHDL 3.3 08/08/2018 10:15 AM   LDLCALC 71 08/08/2018 10:15 AM    Wt Readings from Last 3 Encounters:  05/21/20 158 lb (71.7 kg)  07/05/19 161 lb (73 kg)  12/14/18 158 lb (71.7 kg)     Risk Assessment/Calculations:       Objective:    Vital Signs:  BP 116/74   Pulse (!) 58   Ht 5\' 1"  (1.549 m)   Wt 158 lb (71.7 kg)   BMI 29.85 kg/m    VITAL SIGNS:  reviewed  ASSESSMENT & PLAN:    1. CAD: Denies any recent chest pain.  On aspirin and 60 mg daily of Brilinta maintenance dose.  She has not had any recent chest discomfort  2. Ischemic cardiomyopathy with improved EF.  Most recent echocardiogram obtained in May 2020 showed EF has returned to 55% after the anterior STEMI.  Reduce lisinopril to 2.5 mg daily due to recent signs of hypotension with fatigue and sleepiness after taking the medication.  3. Hyperlipidemia: Continue Lipitor and Zetia.   Shared Decision Making/Informed Consent        COVID-19 Education: The signs and symptoms of COVID-19 were discussed with the patient and how to seek care for testing (follow up with PCP or arrange E-visit).  The importance of social distancing was discussed today.  Time:   Today, I have spent 10 minutes with the patient with telehealth technology discussing the above problems.     Medication Adjustments/Labs and Tests Ordered: Current medicines are reviewed at length with the patient today.  Concerns regarding medicines are outlined above.   Tests Ordered: No orders of the defined types were placed in this encounter.   Medication Changes: Meds ordered this encounter  Medications  . nitroGLYCERIN (NITROSTAT) 0.4 MG SL tablet    Sig: Place 1 tablet (0.4 mg total) under the tongue every 5 (five) minutes as needed for chest pain.    Dispense:  25 tablet    Refill:  3  . atorvastatin (LIPITOR) 80 MG tablet    Sig: Take 1 tablet (80 mg total) by mouth daily.    Dispense:  90 tablet    Refill:  3  . ezetimibe (ZETIA) 10 MG tablet    Sig: Take 1 tablet (10 mg total) by mouth daily.    Dispense:  90 tablet    Refill:  3  . lisinopril (ZESTRIL) 2.5 MG tablet    Sig: Take 1 tablet (2.5 mg total) by mouth daily.    Dispense:  90 tablet    Refill:   3  . metoprolol succinate (TOPROL-XL) 50 MG 24 hr tablet    Sig: Take with or immediately following  a meal.    Dispense:  90 tablet    Refill:  3    Follow Up:  In Person in 6 month(s)  Signed, Azalee Course, [place]  05/23/2020 11:44 PM    [facility]

## 2020-05-23 ENCOUNTER — Encounter: Payer: Self-pay | Admitting: Physician Assistant

## 2020-07-11 ENCOUNTER — Other Ambulatory Visit: Payer: Self-pay | Admitting: Physician Assistant

## 2020-09-03 ENCOUNTER — Other Ambulatory Visit: Payer: Self-pay | Admitting: Cardiovascular Disease

## 2020-12-06 IMAGING — DX DG CHEST 1V PORT
1 series · 1 of 1 positions shown · non-contrast
Comparison: 07/31/2018

CLINICAL DATA: Chest pain since this morning. Recent STEMI and
stent.

EXAM:
PORTABLE CHEST 1 VIEW

[chest]
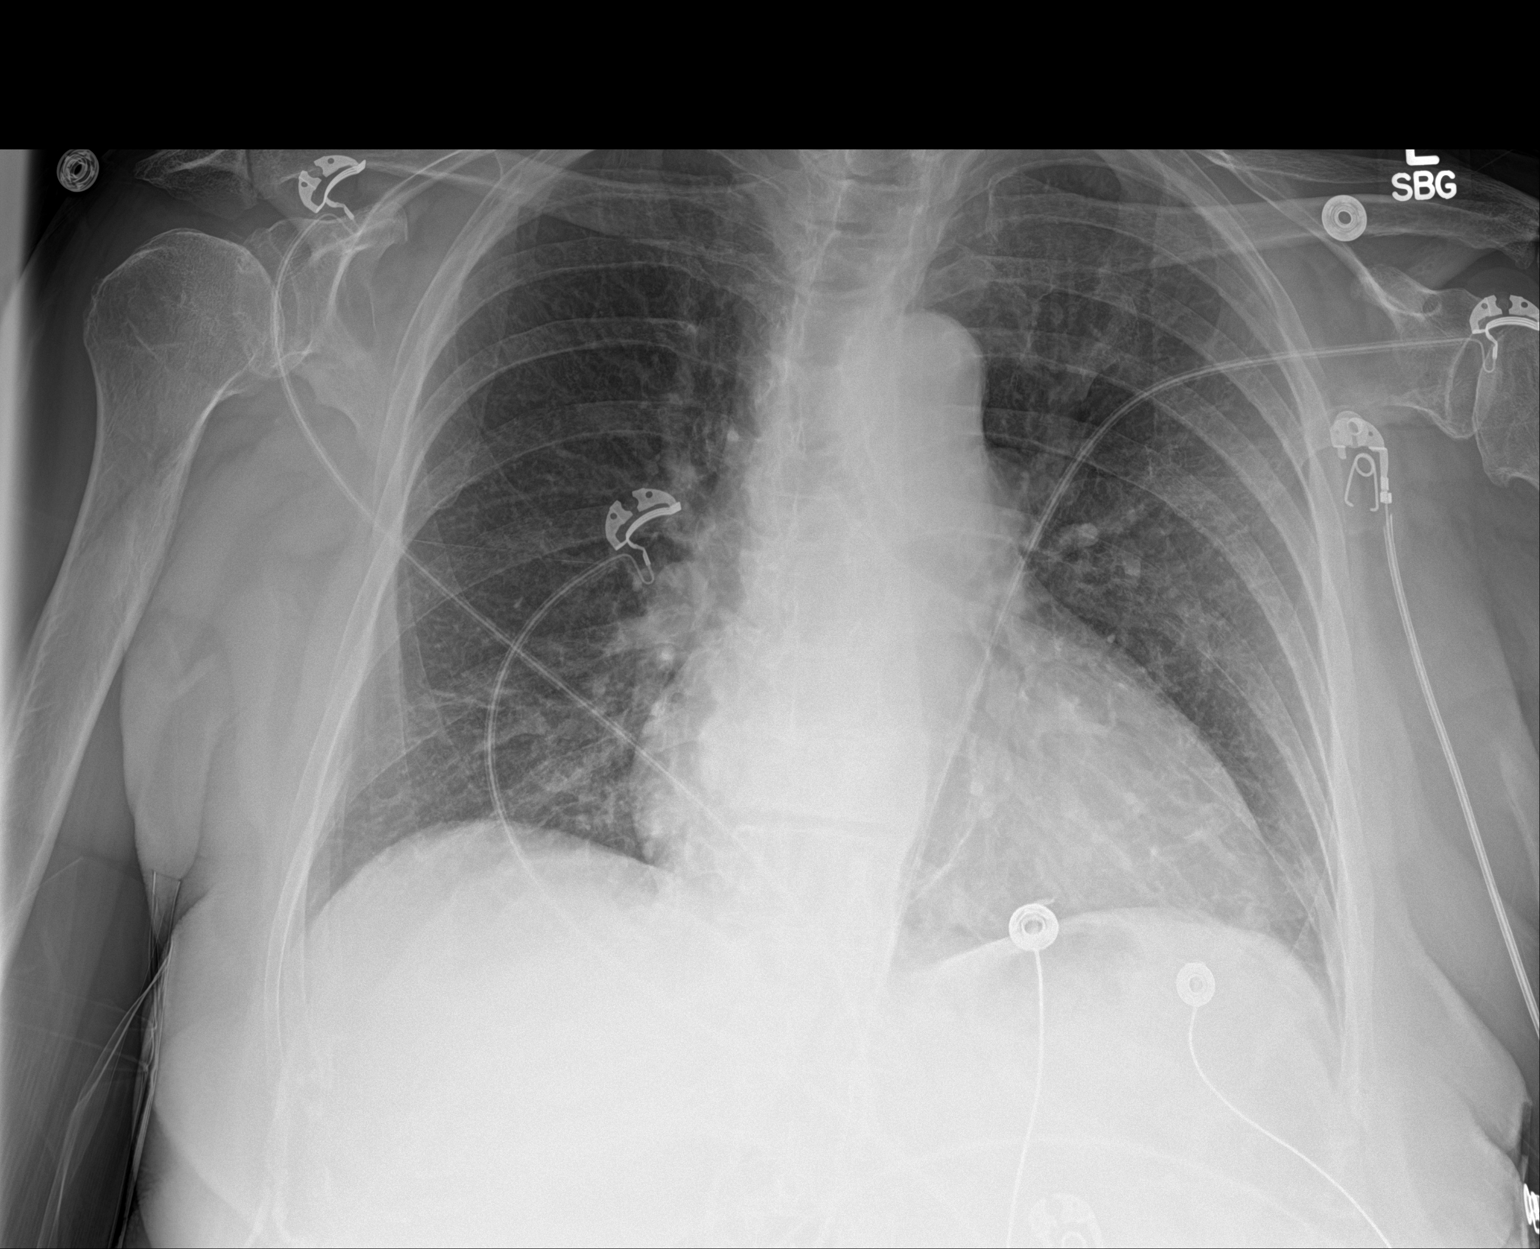

[1 of 1 positions shown; findings below may reference images not displayed]

FINDINGS: The heart is mildly enlarged but stable given the AP projection and
portable technique. The lungs are clear. No pleural effusion. The
bony thorax is intact.
IMPRESSION: Mild stable cardiac enlargement but no acute pulmonary findings.

## 2021-03-08 ENCOUNTER — Ambulatory Visit: Payer: Medicare HMO | Admitting: Physician Assistant

## 2021-03-12 ENCOUNTER — Other Ambulatory Visit: Payer: Self-pay | Admitting: Physician Assistant

## 2021-04-25 ENCOUNTER — Ambulatory Visit: Payer: Medicare HMO | Admitting: Physician Assistant

## 2021-05-24 ENCOUNTER — Other Ambulatory Visit: Payer: Self-pay | Admitting: Physician Assistant

## 2021-07-02 ENCOUNTER — Telehealth: Payer: Self-pay | Admitting: Cardiovascular Disease

## 2021-07-02 ENCOUNTER — Other Ambulatory Visit: Payer: Self-pay | Admitting: Physician Assistant

## 2021-07-02 ENCOUNTER — Other Ambulatory Visit: Payer: Self-pay | Admitting: Cardiovascular Disease

## 2021-07-02 MED ORDER — EZETIMIBE 10 MG PO TABS: 10.0000 mg | ORAL_TABLET | Freq: Every day | ORAL | 0 refills | Status: DC

## 2021-07-02 MED ORDER — TICAGRELOR 60 MG PO TABS: 60.0000 mg | ORAL_TABLET | Freq: Two times a day (BID) | ORAL | 0 refills | Status: DC

## 2021-07-02 NOTE — Telephone Encounter (Signed)
Refills has been sent to the pharmacy. 

## 2021-07-02 NOTE — Telephone Encounter (Signed)
°*  STAT* If patient is at the pharmacy, call can be transferred to refill team.   1. Which medications need to be refilled? (please list name of each medication and dose if known)  BRILINTA 60 MG TABS tablet ezetimibe (ZETIA) 10 MG tablet   2. Which pharmacy/location (including street and city if local pharmacy) is medication to be sent to? [facility] - [place], [place] - [address]  3. Do they need a 30 day or 90 day supply?  90 day supply  PT HAS AN UPCOMING APPT ON 07/18/21

## 2021-07-15 ENCOUNTER — Telehealth: Payer: Self-pay | Admitting: Cardiovascular Disease

## 2021-07-15 NOTE — Telephone Encounter (Signed)
Patient is requesting to convert 01/19 appointment with Juanda Crumble, PA to a virtual. She states her whole family has been exposed to COVID and she would prefer to do the appointment virtually if possible. Please advise.

## 2021-07-15 NOTE — Telephone Encounter (Signed)
Ok to convert to virtual. 

## 2021-07-16 NOTE — Telephone Encounter (Signed)
Left message on personal voicemail that appointment on 1/19 can be virtual.

## 2021-07-17 NOTE — Progress Notes (Signed)
Virtual Visit via Telephone Note   This visit type was conducted due to national recommendations for restrictions regarding the COVID-19 Pandemic (e.g. social distancing) in an effort to limit this patient's exposure and mitigate transmission in our community.  Due to her co-morbid illnesses, this patient is at least at moderate risk for complications without adequate follow up.  This format is felt to be most appropriate for this patient at this time.  The patient did not have access to video technology/had technical difficulties with video requiring transitioning to audio format only (telephone).  All issues noted in this document were discussed and addressed.  No physical exam could be performed with this format.  Please refer to the patient's chart for her  consent to telehealth for [facility].    Date:  07/18/2021   ID:  Lynn Hogan, DOB 1945-09-04, MRN [id number] The patient was identified using 2 identifiers.  Patient Location: Home Provider Location: Office/Clinic   PCP:  Si Gaul, DO   [facility] Providers Cardiologist:  Nicki Guadalajara, MD {  Evaluation Performed:  Follow-Up Visit  Chief Complaint:  overdue 6 month follow-up  History of Present Illness:    Lynn Hogan is a 76 y.o. female with CAD, HLD, and ischemic cardiomyopathy with improved EF.  Patient was admitted for NSTEMI in February 2020, initial cardiac catheterization found a high-grade segmental tandem proximal and mid RCA stenosis, this was treated with a 3.0 x 38 mm Synergy stent.  She was also found to have 85 to 90% stenosis in proximal diagonal and 55% mid LAD stenosis.  They were managed medically, she was eventually discharged on aspirin, Plavix and high-dose statin.  Unfortunately, she returned to the hospital a few days later with severe chest pain and was diagnosed with STEMI.  Repeat cardiac catheterization demonstrated subtotal occlusion of mid LAD at the site of previous 55% stenosis, she  underwent successful stenting with a 3.5 x 20 mm Synergy DES.  There was no change in the degree of her previous diagonal lesion.  EF has dropped to 30 to 35% after the anterior STEMI and she was ultimately discharged on LifeVest.   Repeat echocardiogram in May 2020 demonstrated EF of 60 to 65%, grade 1 DD.  Due to worsening LFT, her Lipitor was reduced to 40 mg daily.  Her daughter is a Advice worker.  She last saw Azalee Course in November 2021 with a telemedicine appointment.  Patient felt dizzy and sleepy after taking her morning lisinopril with systolic blood pressures in the 100s.  Lisinopril was decreased to 2.5 mg daily.  Patient has a virtual appointment today since she had COVID exposure recently.  She has no COVID symptoms.  Today, patient feels well.  She denies chest pain, shortness of breath, PND, orthopnea, lower extremity edema or gross bleeding.  She mentions she has easy bruising and has developed blood blisters.  She wonders if she needs to continue Brilinta.  She also mentioned lightheadedness after taking lisinopril 2.5 mg and notices blood pressure drop.  No syncope.  She lives alone.  She walks her 2 dogs and helps a neighbor out.  She has a treadmill and bike at home she does 3 times a week.  She keeps up the house by herself.  She preps her meals and makes healthy choices.  She does not eat red meat.  She does notice weight gain of 10 pounds since COVID began.     Past Medical History:  Diagnosis Date  Coronary artery disease    Hyperlipidemia    NSTEMI (non-ST elevated myocardial infarction) ([place])    08/02/2018-PCI/DES to RCA    STEMI (ST elevation myocardial infarction) ([place])    08/08/2018-PCI/DES to mLAD, 85% osital stenosis of 1st diag.    Systolic heart failure [facility])    Past Surgical History:  Procedure Laterality Date   CARDIAC CATHETERIZATION     CATARACT EXTRACTION, BILATERAL  02/12/2016   CORONARY STENT INTERVENTION  08/02/2018   CORONARY STENT INTERVENTION  N/A 08/02/2018   Procedure: CORONARY STENT INTERVENTION;  Surgeon: Nelva Bush, MD;  Location: [place] CV LAB;  Service: Cardiovascular;  Laterality: N/A;   CORONARY/GRAFT ACUTE MI REVASCULARIZATION N/A 08/08/2018   Procedure: Coronary/Graft Acute MI Revascularization;  Surgeon: Troy Sine, MD;  Location: [place] CV LAB;  Service: Cardiovascular;  Laterality: N/A;   HYSTERECTOMY ABDOMINAL WITH SALPINGECTOMY  02/12/1971   LEFT HEART CATH AND CORONARY ANGIOGRAPHY N/A 08/02/2018   Procedure: LEFT HEART CATH AND CORONARY ANGIOGRAPHY;  Surgeon: Nelva Bush, MD;  Location: [place] CV LAB;  Service: Cardiovascular;  Laterality: N/A;   LEFT HEART CATH AND CORONARY ANGIOGRAPHY N/A 08/08/2018   Procedure: LEFT HEART CATH AND CORONARY ANGIOGRAPHY;  Surgeon: Troy Sine, MD;  Location: [place] CV LAB;  Service: Cardiovascular;  Laterality: N/A;     Current Meds  Medication Sig   acetaminophen (TYLENOL) 325 MG tablet Take 325 mg by mouth every 6 (six) hours as needed.   aspirin EC 81 MG EC tablet Take 1 tablet (81 mg total) by mouth daily.   atorvastatin (LIPITOR) 40 MG tablet Take 40 mg by mouth daily. Take 1 Tablet Daily   Cholecalciferol (VITAMIN D3) 10 MCG (400 UNIT) tablet Take 400 Units by mouth daily.   ezetimibe (ZETIA) 10 MG tablet Take 1 tablet (10 mg total) by mouth daily. KEEP OV.   metoprolol succinate (TOPROL-XL) 50 MG 24 hr tablet TAKE ONE TABLET BY MOUTH DAILY WITH OR IMMEDIATELY FOLLOWING A MEAL.  Patient need an appointment for future refills.   nitroGLYCERIN (NITROSTAT) 0.4 MG SL tablet Place 1 tablet (0.4 mg total) under the tongue every 5 (five) minutes as needed for chest pain.   ticagrelor (BRILINTA) 60 MG TABS tablet Take 1 tablet (60 mg total) by mouth 2 (two) times daily. KEEP OV.   [DISCONTINUED] atorvastatin (LIPITOR) 80 MG tablet Take 1 tablet (80 mg total) by mouth daily. Patient need an appointment for future refills.   [DISCONTINUED] lisinopril  (ZESTRIL) 2.5 MG tablet TAKE 1 TABLET EVERY DAY     Allergies:   Ibuprofen, Nsaids, and Banana   Social History   Tobacco Use   Smoking status: Never   Smokeless tobacco: Never  Vaping Use   Vaping Use: Never used  Substance Use Topics   Alcohol use: Never   Drug use: Never     Family Hx: The patient's family history includes Brain cancer in her mother; Heart attack (age of onset: 25) in her father; Lung cancer in her mother; Microcephaly in her mother.  ROS:   Please see the history of present illness.    All other systems reviewed and are negative.   Prior CV studies:   The following studies were reviewed today: LHC x 2 in 2020, Echo 10/2018  Labs/Other Tests and Data Reviewed:    EKG:  No ECG reviewed.  Recent Labs: No results found for requested labs within last 8760 hours.   Recent Lipid Panel Lab Results  Component Value  Date/Time   CHOL 127 08/08/2018 10:15 AM   TRIG 92 08/08/2018 10:15 AM   HDL 38 (L) 08/08/2018 10:15 AM   CHOLHDL 3.3 08/08/2018 10:15 AM   LDLCALC 71 08/08/2018 10:15 AM    Wt Readings from Last 3 Encounters:  07/18/21 171 lb (77.6 kg)  05/21/20 158 lb (71.7 kg)  07/05/19 161 lb (73 kg)     Risk Assessment/Calculations:          Objective:    Vital Signs: Blood pressure 124/62, heart rate 62, pulse ox 98%.  Weight 171 pounds.  Height 5'2"  ASSESSMENT & PLAN:    CAD, stable - no angina -MI x 2 in 2020: PCI to RCA & LAD -Continue beta-blocker therapy. -Stop lisinopril given symptomatic hypotension. -Continue aspirin.  Since patient has been angina free x3 years, will discuss with Dr. Claiborne Billings if we are able to discontinue her Brilinta.  Ischemic cardiomyopathy with normalized EF -Continue beta-blocker.  Hyperlipidemia with goal LDL less than 70 -Fasting lipids. -Continue Lipitor 40 mg and Zetia.  Easy bruising/blood blisters -Check CBC.  Patient requests dermatology referral. -We will see if Dr. Claiborne Billings is comfortable  discontinuing Brilinta.  Disposition - Follow-up in 1 year in person.            COVID-19 Education: The signs and symptoms of COVID-19 were discussed with the patient and how to seek care for testing (follow up with PCP or arrange E-visit).  The importance of social distancing was discussed today.  Time:   Today, I have spent 21 minutes with the patient with telehealth technology discussing the above problems.     Medication Adjustments/Labs and Tests Ordered: Current medicines are reviewed at length with the patient today.  Concerns regarding medicines are outlined above.   Tests Ordered: Orders Placed This Encounter  Procedures   CBC   Lipid panel   Comprehensive metabolic panel   Ambulatory referral to Dermatology    Medication Changes: No orders of the defined types were placed in this encounter.   Follow Up:  In Person in 1 year(s)  Signed, Gaston Islam  07/18/2021 12:18 PM    [facility]

## 2021-07-18 ENCOUNTER — Encounter: Payer: Self-pay | Admitting: Physician Assistant

## 2021-07-18 ENCOUNTER — Ambulatory Visit: Payer: Medicare HMO | Admitting: Physician Assistant

## 2021-07-18 VITALS — BP 124/62 | HR 62 | Ht 62.0 in | Wt 171.0 lb

## 2021-07-18 DIAGNOSIS — I255 Ischemic cardiomyopathy: Secondary | ICD-10-CM

## 2021-07-18 DIAGNOSIS — I2510 Atherosclerotic heart disease of native coronary artery without angina pectoris: Secondary | ICD-10-CM

## 2021-07-18 DIAGNOSIS — E785 Hyperlipidemia, unspecified: Secondary | ICD-10-CM

## 2021-07-18 NOTE — Patient Instructions (Signed)
Medication Instructions:  Stop Lisinopril. *If you need a refill on your cardiac medications before your next appointment, please call your pharmacy*   Lab Work: CBC,CMET,Lipid. If you have labs (blood work) drawn today and your tests are completely normal, you will receive your results only by: MyChart Message (if you have MyChart) OR A paper copy in the mail If you have any lab test that is abnormal or we need to change your treatment, we will call you to review the results.   Testing/Procedures: No Testing   Follow-Up: At [facility], you and your health needs are our priority.  As part of our continuing mission to provide you with exceptional heart care, we have created designated Provider Care Teams.  These Care Teams include your primary Cardiologist (physician) and Advanced Practice Providers (APPs -  Physician Assistants and Nurse Practitioners) who all work together to provide you with the care you need, when you need it.  We recommend signing up for the patient portal called "MyChart".  Sign up information is provided on this After Visit Summary.  MyChart is used to connect with patients for Virtual Visits (Telemedicine).  Patients are able to view lab/test results, encounter notes, upcoming appointments, etc.  Non-urgent messages can be sent to your provider as well.   To learn more about what you can do with MyChart, go to [email or web address].    Your next appointment:   1 year(s)  The format for your next appointment:   In Person  Provider:   Nicki Guadalajara, MD     Other Instructions Victorino Dike will call regarding Brilinta after consulting with Dr. Tresa Endo.

## 2021-07-26 LAB — COMPREHENSIVE METABOLIC PANEL
ALT: 13 IU/L (ref 0–32)
AST: 25 IU/L (ref 0–40)
Albumin/Globulin Ratio: 1.8 (ref 1.2–2.2)
Albumin: 4.4 g/dL (ref 3.7–4.7)
Alkaline Phosphatase: 87 IU/L (ref 44–121)
BUN/Creatinine Ratio: 14 (ref 12–28)
BUN: 11 mg/dL (ref 8–27)
Bilirubin Total: 0.3 mg/dL (ref 0.0–1.2)
CO2: 24 mmol/L (ref 20–29)
Calcium: 9.7 mg/dL (ref 8.7–10.3)
Chloride: 108 mmol/L — ABNORMAL HIGH (ref 96–106)
Creatinine, Ser: 0.77 mg/dL (ref 0.57–1.00)
Globulin, Total: 2.4 g/dL (ref 1.5–4.5)
Glucose: 99 mg/dL (ref 70–99)
Potassium: 4.6 mmol/L (ref 3.5–5.2)
Sodium: 146 mmol/L — ABNORMAL HIGH (ref 134–144)
Total Protein: 6.8 g/dL (ref 6.0–8.5)
eGFR: 80 mL/min/{1.73_m2} (ref >59)

## 2021-07-26 LAB — CBC
Hematocrit: 39.5 % (ref 34.0–46.6)
Hemoglobin: 13.1 g/dL (ref 11.1–15.9)
MCH: 30.8 pg (ref 26.6–33.0)
MCHC: 33.2 g/dL (ref 31.5–35.7)
MCV: 93 fL (ref 79–97)
Platelets: 350 10*3/uL (ref 150–450)
RBC: 4.26 x10E6/uL (ref 3.77–5.28)
RDW: 12.4 % (ref 11.7–15.4)
WBC: 7.9 10*3/uL (ref 3.4–10.8)

## 2021-07-26 LAB — LIPID PANEL
Chol/HDL Ratio: 3 ratio (ref 0.0–4.4)
Cholesterol, Total: 137 mg/dL (ref 100–199)
HDL: 46 mg/dL (ref >39)
LDL Chol Calc (NIH): 69 mg/dL (ref 0–99)
Triglycerides: 122 mg/dL (ref 0–149)
VLDL Cholesterol Cal: 22 mg/dL (ref 5–40)

## 2021-07-30 ENCOUNTER — Telehealth: Payer: Self-pay

## 2021-07-30 NOTE — Telephone Encounter (Addendum)
Called patient regarding results. Patient had understanding of results.----- Message from Cannon Kettle, PA-C sent at 07/30/2021 11:56 AM EST ----- -Electrolytes & kidney function normal.   -Anemia resolved, blood counts normal. -Cholesterol well controlled -- LDL at goal (<70) and good cholesterol is higher than 2 years ago.  Continue Lipitor 40 mg and Zetia.

## 2021-07-31 ENCOUNTER — Telehealth: Payer: Self-pay

## 2021-07-31 NOTE — Telephone Encounter (Addendum)
Call patient regarding results.----- Message from Warren Lacy, PA-C sent at 07/30/2021 11:56 AM EST ----- -Electrolytes & kidney function normal.   -Anemia resolved, blood counts normal. -Cholesterol well controlled -- LDL at goal (<70) and good cholesterol is higher than 2 years ago.  Continue Lipitor 40 mg and Zetia.

## 2021-08-09 ENCOUNTER — Other Ambulatory Visit: Payer: Self-pay | Admitting: Physician Assistant

## 2021-08-09 ENCOUNTER — Other Ambulatory Visit: Payer: Self-pay | Admitting: Cardiovascular Disease

## 2021-09-14 ENCOUNTER — Other Ambulatory Visit: Payer: Self-pay | Admitting: Cardiovascular Disease

## 2021-09-24 ENCOUNTER — Other Ambulatory Visit: Payer: Self-pay | Admitting: Physician Assistant

## 2022-02-05 ENCOUNTER — Ambulatory Visit: Payer: Medicare HMO | Admitting: Physician Assistant

## 2022-02-10 ENCOUNTER — Other Ambulatory Visit: Payer: Self-pay | Admitting: Cardiovascular Disease

## 2022-02-12 DIAGNOSIS — C4362 Malignant melanoma of left upper limb, including shoulder: Secondary | ICD-10-CM | POA: Diagnosis present

## 2022-02-19 ENCOUNTER — Telehealth: Payer: Self-pay | Admitting: Cardiovascular Disease

## 2022-02-19 NOTE — Telephone Encounter (Signed)
   Pre-operative Risk Assessment    Patient Name: Lynn Hogan  DOB: [birth year] MRN: [id number]      Request for Surgical Clearance    Procedure:   left arm skin graft  Date of Surgery:  Clearance 03/08/22                                 Surgeon:  Dr. Nelta Numbers Group or Practice Name:  [facility] Phone number:  [phone] Fax number:  [phone]   Type of Clearance Requested:   - Medical  - Pharmacy:  Hold Ticagrelor (Brilinta) for 7 days priort   Type of Anesthesia:  General    Additional requests/questions:    [name]   02/19/2022, 9:22 AM

## 2022-02-19 NOTE — Telephone Encounter (Signed)
    Primary Cardiologist:Thomas Tresa Endo, MD  Chart reviewed as part of pre-operative protocol coverage. Because of Lynn Hogan past medical history and time since last visit, he/she will require a follow-up visit in order to better assess preoperative cardiovascular risk.  Pre-op covering staff: - Please schedule appointment and call patient to inform them. - Please contact requesting surgeon's office via preferred method (i.e, phone, fax) to inform them of need for appointment prior to surgery.  If applicable, this message will also be routed to pharmacy pool and/or primary cardiologist for input on holding anticoagulant/antiplatelet agent as requested below so that this information is available at time of patient's appointment.   Ronney Asters, NP  02/19/2022, 1:01 PM

## 2022-02-21 NOTE — Telephone Encounter (Signed)
I s/w the pt and stated that it looks like her surgery is 03/08/22 per surgeon office and we will need to mover her appt up sooner. Pt states she has down that her surgery is on 03/13/22. I informed her that the surgeon states it is on 03/08/22. I did advise the pt to reach out to the surgeon office. Pt is concerned that if they did change her surgery date, they did not let her know.  Pt has been scheduled now to see Edd Fabian, FNP 02/25/22 @ 1:55 for pre op clearance. I have cancelled the appt with Juanda Crumble, PAC. Pt thanked me for the help in this matter.

## 2022-02-21 NOTE — Telephone Encounter (Signed)
Alexis from surgical specialist called asking about pt holding Brilinta for 7 days. Pt's preop appt was made for 03/12/22 however surgery is scheduled for 03/08/22.

## 2022-02-24 NOTE — Progress Notes (Unsigned)
Cardiology Clinic Note   Patient Name: Lynn Hogan Date of Encounter: 02/25/2022  Primary Care Provider:  Si Gaul, DO Primary Cardiologist:  Nicki Guadalajara, MD  Patient Profile    Lynn Hogan 76 year old female presents to the clinic today for follow-up evaluation of her coronary artery disease, ischemic cardiomyopathy, and preoperative cardiac evaluation.  Past Medical History    Past Medical History:  Diagnosis Date   Coronary artery disease    Hyperlipidemia    NSTEMI (non-ST elevated myocardial infarction) (HCC)    08/02/2018-PCI/DES to RCA    STEMI (ST elevation myocardial infarction) (HCC)    08/08/2018-PCI/DES to mLAD, 85% osital stenosis of 1st diag.    Systolic heart failure [facility])    Past Surgical History:  Procedure Laterality Date   CARDIAC CATHETERIZATION     CATARACT EXTRACTION, BILATERAL  02/12/2016   CORONARY STENT INTERVENTION  08/02/2018   CORONARY STENT INTERVENTION N/A 08/02/2018   Procedure: CORONARY STENT INTERVENTION;  Surgeon: Yvonne Kendall, MD;  Location: [facility] INVASIVE CV LAB;  Service: Cardiovascular;  Laterality: N/A;   CORONARY/GRAFT ACUTE MI REVASCULARIZATION N/A 08/08/2018   Procedure: Coronary/Graft Acute MI Revascularization;  Surgeon: [name], MD;  Location: [facility] INVASIVE CV LAB;  Service: Cardiovascular;  Laterality: N/A;   HYSTERECTOMY ABDOMINAL WITH SALPINGECTOMY  02/12/1971   LEFT HEART CATH AND CORONARY ANGIOGRAPHY N/A 08/02/2018   Procedure: LEFT HEART CATH AND CORONARY ANGIOGRAPHY;  Surgeon: Yvonne Kendall, MD;  Location: [facility] INVASIVE CV LAB;  Service: Cardiovascular;  Laterality: N/A;   LEFT HEART CATH AND CORONARY ANGIOGRAPHY N/A 08/08/2018   Procedure: LEFT HEART CATH AND CORONARY ANGIOGRAPHY;  Surgeon: [name], MD;  Location: [facility] INVASIVE CV LAB;  Service: Cardiovascular;  Laterality: N/A;    Allergies  Allergies  Allergen Reactions   Ibuprofen Palpitations   Nsaids Palpitations    Patient stated she has not  tried all NSAIDS to see if they give her palpitations, but she is not willing to try anything else- just in case   Banana Nausea Only    History of Present Illness    Lynn Hogan has a PMH of coronary artery disease, hyperlipidemia, and ischemic cardiomyopathy with improved EF.  She was admitted with NSTEMI 2/20.  Her cardiac catheterization showed proximal and mid RCA stenosis.  She received PCI with DES.  She was also noted to have 85-90% stenosis of her proximal diagonal and 55% stenosis of her mid LAD.  Medical management was recommended.  She was placed on aspirin Plavix and statin therapy.  She returned to the hospital a few days later with STEMI and reported severe chest pain.  She underwent cardiac catheterization which showed subtotal occlusion of her mid LAD.  She underwent successful stenting with DES.  There was no change in her degree of previous diagonal stenosis.  Her EF dropped to 30-35% after anterior STEMI.  She was given a LifeVest.  She returned LifeVest after wearing it for 5 weeks due to frustration.  Her follow-up echocardiogram 5/20 showed EF of 60-65%, G1 DD.  She was noted to have worsening LFTs and her Lipitor was reduced to 40 mg daily.  She was seen by Azalee Course PA-C 05/21/2020.  During that time she was seen virtually.  Her Brilinta was decreased to 60 mg twice daily.  She reported doing well since January.  She denied chest pain and shortness of breath.  Her daughter is a Advice worker.  She occasionally noticed burning in her arms.  She  denied bleeding issues on Brilinta.  She was planning to see her PCP for annual labs.  Her lisinopril was reduced to 2.5 mg daily.  She presents to the clinic today for follow-up evaluation and states she notices increased bruising with continued Brilinta.  She also has a area on her left forearm that she would like removed.  Surgeon requested Brilinta hold for 7 days prior to surgery.  However, her blood pressure is elevated in the  0000000 systolic range in the office today.  We reviewed her previous cardiac catheterizations and stenting x2.  She remains stable from a cardiac standpoint and remains very physically active.  I will have her maintain a blood pressure log for 1 week and provide results.  We will stop her Brilinta at this time and continue her aspirin.  We will plan follow-up in 6 months.  She denies chest pain, shortness of breath, lower extremity edema, fatigue, palpitations, melena, hematuria, hemoptysis, diaphoresis, weakness, presyncope, syncope, orthopnea, and PND.     Home Medications    Prior to Admission medications   Medication Sig Start Date End Date Taking? Authorizing Provider  acetaminophen (TYLENOL) 325 MG tablet Take 325 mg by mouth every 6 (six) hours as needed.    [provider]  aspirin EC 81 MG EC tablet Take 1 tablet (81 mg total) by mouth daily. 08/03/18   Bhagat, Crista Luria, PA  atorvastatin (LIPITOR) 40 MG tablet Take 40 mg by mouth daily. Take 1 Tablet Daily    [provider]  atorvastatin (LIPITOR) 40 MG tablet Take 1 tablet (40 mg total) by mouth daily. 08/09/21   Troy Sine, MD  Cholecalciferol (VITAMIN D3) 10 MCG (400 UNIT) tablet Take 400 Units by mouth daily.    [provider]  ezetimibe (ZETIA) 10 MG tablet TAKE 1 TABLET EVERY DAY (KEEP OFFICE VISIT) 02/10/22   Troy Sine, MD  metoprolol succinate (TOPROL-XL) 50 MG 24 hr tablet TAKE ONE TABLET BY MOUTH DAILY WITH OR IMMEDIATELY FOLLOWING A MEAL 09/24/21   Troy Sine, MD  nitroGLYCERIN (NITROSTAT) 0.4 MG SL tablet Place 1 tablet (0.4 mg total) under the tongue every 5 (five) minutes as needed for chest pain. 05/21/20 07/18/21  Almyra Deforest, PA  ticagrelor (BRILINTA) 60 MG TABS tablet TAKE 1 TABLET TWICE DAILY (KEEP OFFICE VISIT) 02/10/22   Troy Sine, MD    Family History    Family History  Problem Relation Age of Onset   Microcephaly Mother    Brain cancer Mother    Lung cancer  Mother    Heart attack Father 57   She indicated that the status of her mother is unknown. She indicated that her father is deceased.  Social History    Social History   Socioeconomic History   Marital status: Single    Spouse name: Not on file   Number of children: Not on file   Years of education: Not on file   Highest education level: Not on file  Occupational History   Not on file  Tobacco Use   Smoking status: Never   Smokeless tobacco: Never  Vaping Use   Vaping Use: Never used  Substance and Sexual Activity   Alcohol use: Never   Drug use: Never   Sexual activity: Not Currently  Other Topics Concern   Not on file  Social History Narrative   Not on file   Social Determinants of Health   Financial Resource Strain: Low Risk  (08/01/2018)  Overall Financial Resource Strain (CARDIA)    Difficulty of Paying Living Expenses: Not hard at all  Food Insecurity: Unknown (08/01/2018)   Hunger Vital Sign    Worried About Running Out of Food in the Last Year: Patient refused    Ran Out of Food in the Last Year: Patient refused  Transportation Needs: Unknown (08/01/2018)   PRAPARE - Transportation    Lack of Transportation (Medical): Patient refused    Lack of Transportation (Non-Medical): Patient refused  Physical Activity: Sufficiently Active (08/01/2018)   Exercise Vital Sign    Days of Exercise per Week: 7 days    Minutes of Exercise per Session: 30 min  Stress: No Stress Concern Present (08/01/2018)   [employer] of Occupational Health - Occupational Stress Questionnaire    Feeling of Stress : Only a little  Social Connections: Unknown (08/01/2018)   Social Connection and Isolation Panel [NHANES]    Frequency of Communication with Friends and Family: Patient refused    Frequency of Social Gatherings with Friends and Family: Patient refused    Attends Religious Services: Patient refused    Active Member of Clubs or Organizations: Patient refused    Attends Tax inspector Meetings: Patient refused    Marital Status: Patient refused  Intimate Partner Violence: Unknown (08/01/2018)   Humiliation, Afraid, Rape, and Kick questionnaire    Fear of Current or Ex-Partner: Patient refused    Emotionally Abused: Patient refused    Physically Abused: Patient refused    Sexually Abused: Patient refused     Review of Systems    General:  No chills, fever, night sweats or weight changes.  Cardiovascular:  No chest pain, dyspnea on exertion, edema, orthopnea, palpitations, paroxysmal nocturnal dyspnea. Dermatological: No rash, lesions/masses Respiratory: No cough, dyspnea Urologic: No hematuria, dysuria Abdominal:   No nausea, vomiting, diarrhea, bright red blood per rectum, melena, or hematemesis Neurologic:  No visual changes, wkns, changes in mental status. All other systems reviewed and are otherwise negative except as noted above.  Physical Exam    VS:  BP (!) 162/78   Pulse 64   Ht 5\' 2"  (1.575 m)   Wt 173 lb 3.2 oz (78.6 kg)   BMI 31.68 kg/m  , BMI Body mass index is 31.68 kg/m. GEN: Well nourished, well developed, in no acute distress. HEENT: normal. Neck: Supple, no JVD, carotid bruits, or masses. Cardiac: RRR, no murmurs, rubs, or gallops. No clubbing, cyanosis, edema.  Radials/DP/PT 2+ and equal bilaterally.  Respiratory:  Respirations regular and unlabored, clear to auscultation bilaterally. GI: Soft, nontender, nondistended, BS + x 4. MS: no deformity or atrophy. Skin: warm and dry, no rash.  Left forearm lesion 2 cm x 2 cm Neuro:  Strength and sensation are intact. Psych: Normal affect.  Accessory Clinical Findings    Recent Labs: 07/25/2021: ALT 13; BUN 11; Creatinine, Ser 0.77; Hemoglobin 13.1; Platelets 350; Potassium 4.6; Sodium 146   Recent Lipid Panel    Component Value Date/Time   CHOL 137 07/25/2021 0919   TRIG 122 07/25/2021 0919   HDL 46 07/25/2021 0919   CHOLHDL 3.0 07/25/2021 0919   CHOLHDL 3.3 08/08/2018 1015    VLDL 18 08/08/2018 1015   LDLCALC 69 07/25/2021 0919    HYPERTENSION CONTROL Vitals:   02/25/22 1358 02/25/22 1423  BP: (!) 182/84 (!) 162/78    The patient's blood pressure is elevated above target today.  In order to address the patient's elevated BP: Blood pressure will be monitored at  home to determine if medication changes need to be made.; The blood pressure is usually elevated in clinic.  Blood pressures monitored at home have been optimal.       ECG personally reviewed by me today-normal sinus rhythm no ectopy 63 bpm- No acute changes  Echocardiogram 11/11/2018  IMPRESSIONS     1. The left ventricle has normal systolic function with an ejection  fraction of 60-65%. The cavity size was normal. There is mildly increased  left ventricular wall thickness. Left ventricular diastolic Doppler  parameters are consistent with impaired  relaxation.   2. The right ventricle has normal systolic function. The cavity was  normal.   3. Left atrial size was mildly dilated.   4. Right atrial size was mildly dilated.   5. The mitral valve is grossly normal. There is mild mitral annular  calcification present.   6. The tricuspid valve is grossly normal.   7. The aortic valve is tricuspid. Mild thickening of the aortic valve. No  stenosis of the aortic valve.   8. Normal LV systolic function; mild LVH; mild diastolic dysfunction;  mild biatrial enlargement; mild MR and TR  Assessment & Plan   1.  Coronary artery disease-no recent episodes of arm neck back or chest discomfort.  Underwent cardiac catheterization 2/20 and again days later with acute STEMI.  She received segmental tandem proximal and mid RCA stenting and subsequently mid LAD stenting.  Previously did not tolerate clopidogrel Continue aspirin, ezetimibe, atorvastatin, metoprolol Heart healthy low-sodium diet-salty 6 given Increase physical activity as tolerated May stop Brilinta   Hyperlipidemia-LDL-69 on  07/25/2021. Continue atorvastatin, ezetimibe Heart healthy low-sodium-diet Increase physical activity as tolerated  Ischemic cardiomyopathy-no increased DOE or activity intolerance.  Her echocardiogram 11/10/2020 showed normal LVEF, mildly increased LV thickness, and impaired diastolic function.  She was noted to have mildly dilated left atria and right atria.  She was noted to have mild thickening of her aortic valve with no stenosis. Continue metoprolol Heart healthy low-sodium diet-salty 6 given Increase physical activity as tolerated  Hypertension-initially 182/84 and on recheck 162/78.  Appears to be component of anxiety/whitecoat hypertension.  Unable to provide cardiac clearance at this time. Maintain blood pressure log-send to clinic in 1 week-at that time we will be able to provide note for preoperative cardiac evaluation.   Disposition: Follow-up with Dr. Claiborne Billings, Almyra Deforest PA-C, or me in 6 months.   Jossie Ng. Nico Rogness NP-C     02/25/2022, 2:34 PM [facility] [address] Office [phone] Fax [phone]  Notice: This dictation was prepared with Dragon dictation along with smaller phrase technology. Any transcriptional errors that result from this process are unintentional and may not be corrected upon review.  I spent 14 minutes examining this patient, reviewing medications, and using patient centered shared decision making involving her cardiac care.  Prior to her visit I spent greater than 20 minutes reviewing her past medical history,  medications, and prior cardiac tests.

## 2022-02-25 ENCOUNTER — Ambulatory Visit: Payer: Medicare HMO | Attending: General Practice | Admitting: General Practice

## 2022-02-25 ENCOUNTER — Encounter: Payer: Self-pay | Admitting: General Practice

## 2022-02-25 VITALS — BP 162/78 | HR 64 | Ht 62.0 in | Wt 173.2 lb

## 2022-02-25 DIAGNOSIS — I2510 Atherosclerotic heart disease of native coronary artery without angina pectoris: Secondary | ICD-10-CM

## 2022-02-25 DIAGNOSIS — I255 Ischemic cardiomyopathy: Secondary | ICD-10-CM | POA: Diagnosis not present

## 2022-02-25 DIAGNOSIS — E785 Hyperlipidemia, unspecified: Secondary | ICD-10-CM | POA: Diagnosis not present

## 2022-02-25 NOTE — Patient Instructions (Signed)
Medication Instructions:  STOP BRILINTA  *If you need a refill on your cardiac medications before your next appointment, please call your pharmacy*  Lab Work:   Testing/Procedures:  NONE    NONE  If you have labs (blood work) drawn today and your tests are completely normal, you will receive your results only by:  1-MyChart Message (if you have MyChart) OR 2- A paper copy in the mail.  If you have any lab test that is abnormal or we need to change your treatment, we will call you to review the results.  Special Instructions PLEASE READ AND FOLLOW SALTY 6-ATTACHED-1,800mg  daily  PLEASE MAINTAIN PHYSICAL ACTIVITY AS TOLERATED   TAKE AND LOG YOUR BLOOD PRESSURE AND LET us KNOW THE RESULT IN 1 WEEK  WE WILL LET YOUR SURGEON KNOW WHEN YOU ARE CLEARED   Follow-Up: Your next appointment:  6 month(s) In Person with Nicki Guadalajara, MD   Please call our office 2 months in advance to schedule this appointment  :1  At [facility], you and your health needs are our priority.  As part of our continuing mission to provide you with exceptional heart care, we have created designated Provider Care Teams.  These Care Teams include your primary Cardiologist (physician) and Advanced Practice Providers (APPs -  Physician Assistants and Nurse Practitioners) who all work together to provide you with the care you need, when you need it.  We recommend signing up for the patient portal called "MyChart".  Sign up information is provided on this After Visit Summary.  MyChart is used to connect with patients for Virtual Visits (Telemedicine).  Patients are able to view lab/test results, encounter notes, upcoming appointments, etc.  Non-urgent messages can be sent to your provider as well.   To learn more about what you can do with MyChart, go to [email or web address].    Important Information About Sugar             6 SALTY THINGS TO AVOID     1,800MG  DAILY

## 2022-03-12 ENCOUNTER — Ambulatory Visit: Payer: Medicare HMO | Admitting: Physician Assistant

## 2022-08-13 ENCOUNTER — Other Ambulatory Visit: Payer: Self-pay | Admitting: Cardiovascular Disease

## 2022-09-27 ENCOUNTER — Other Ambulatory Visit: Payer: Self-pay | Admitting: Cardiovascular Disease

## 2022-10-25 ENCOUNTER — Other Ambulatory Visit: Payer: Self-pay | Admitting: Cardiovascular Disease

## 2022-12-17 ENCOUNTER — Other Ambulatory Visit: Payer: Self-pay | Admitting: Cardiovascular Disease

## 2022-12-18 MED ORDER — ATORVASTATIN CALCIUM 40 MG PO TABS: 40.0000 mg | ORAL_TABLET | Freq: Every day | ORAL | 0 refills | Status: DC

## 2022-12-22 ENCOUNTER — Ambulatory Visit: Payer: Medicare HMO | Admitting: Cardiovascular Disease

## 2023-02-11 ENCOUNTER — Other Ambulatory Visit: Payer: Self-pay | Admitting: Cardiovascular Disease

## 2023-03-01 ENCOUNTER — Other Ambulatory Visit: Payer: Self-pay | Admitting: Cardiovascular Disease

## 2023-05-12 ENCOUNTER — Ambulatory Visit: Payer: Medicare HMO | Admitting: Cardiovascular Disease

## 2023-07-18 ENCOUNTER — Other Ambulatory Visit: Payer: Self-pay | Admitting: Cardiovascular Disease

## 2023-08-12 ENCOUNTER — Other Ambulatory Visit: Payer: Self-pay | Admitting: Cardiovascular Disease

## 2023-12-02 ENCOUNTER — Other Ambulatory Visit: Payer: Self-pay | Admitting: Cardiovascular Disease

## 2023-12-20 ENCOUNTER — Other Ambulatory Visit: Payer: Self-pay | Admitting: Cardiovascular Disease

## 2023-12-24 ENCOUNTER — Other Ambulatory Visit: Payer: Self-pay | Admitting: Cardiovascular Disease

## 2023-12-31 ENCOUNTER — Ambulatory Visit
Admission: RE | Admit: 2023-12-31 | Discharge: 2023-12-31 | Disposition: A | Discharge status: Home / Self Care | Source: Ambulatory Visit | Attending: Physician Assistant | Admitting: Physician Assistant

## 2023-12-31 VITALS — BP 173/83 | HR 68 | Temp 98.3°F | Resp 16

## 2023-12-31 DIAGNOSIS — R350 Frequency of micturition: Secondary | ICD-10-CM | POA: Insufficient documentation

## 2023-12-31 DIAGNOSIS — R109 Unspecified abdominal pain: Secondary | ICD-10-CM | POA: Insufficient documentation

## 2023-12-31 LAB — POCT URINALYSIS DIP (MANUAL ENTRY)
Bilirubin, UA: NEGATIVE
Glucose, UA: NEGATIVE mg/dL
Ketones, POC UA: NEGATIVE mg/dL
Nitrite, UA: POSITIVE — AB
Protein Ur, POC: NEGATIVE mg/dL
Spec Grav, UA: 1.015 (ref 1.010–1.025)
Urobilinogen, UA: 0.2 U/dL
pH, UA: 5.5 (ref 5.0–8.0)

## 2023-12-31 MED ORDER — PHENAZOPYRIDINE HCL 100 MG PO TABS: 100.0000 mg | ORAL_TABLET | Freq: Three times a day (TID) | ORAL | 0 refills | Status: DC | PRN

## 2023-12-31 MED ORDER — SULFAMETHOXAZOLE-TRIMETHOPRIM 800-160 MG PO TABS: 1.0000 | ORAL_TABLET | Freq: Two times a day (BID) | ORAL | 0 refills | Status: AC

## 2023-12-31 NOTE — Discharge Instructions (Addendum)
 Based on your symptoms and results of the urinalysis I believe you have a UTI I recommend the following:  I have sent in a script for Bactrim to be taken by mouth twice per day for 7 days  Please finish the entire course of the antibiotic even if you are feeling better before it is completed unless you develop an allergic reaction or are told by a medical provider to stop taking it. I am sending in a script for Pyridium for you to take as needed up to 3 times per day for urinary discomfort. This can turn your urine orange so please do not be alarmed if this happens. We have sent a sample of your urine off for a urine culture.  This will help us  determine what bacteria is causing your symptoms as well as the most appropriate antibiotic to treat it.  If we need to make any adjustments to your medication regimen and new medication will be sent to the pharmacy on file and you will be updated via phone call in MyChart. Stay well hydrated (at least 75 oz of water per day) and avoid holding your urine for prolonged periods of time. If you have any of the following please return to urgent care or go to the emergency room: Persistent symptoms, fever, trouble urinating or inability to urinate, confusion, flank pain.

## 2023-12-31 NOTE — ED Provider Notes (Signed)
 [facility]    CSN: [id number] Arrival date & time: 12/31/23  1412      History   Chief Complaint Chief Complaint  Patient presents with   Back Pain    HPI Lynn Hogan is a 78 y.o. female.   HPI  Pt is here for concerns of right sided flank pain with radiation to the front/abdomen She is here with her daughter who is assisting with HPI They report she has had these symptoms for several days  She states she has some tingling and spasms in her back with urination but denies dysuria  She reports some urinary hesitancy and increased urinary frequency    Past Medical History:  Diagnosis Date   Coronary artery disease    Hyperlipidemia    NSTEMI (non-ST elevated myocardial infarction) (HCC)    08/02/2018-PCI/DES to RCA    STEMI (ST elevation myocardial infarction) (HCC)    08/08/2018-PCI/DES to mLAD, 85% osital stenosis of 1st diag.    Systolic heart failure [facility])     Patient Active Problem List   Diagnosis Date Noted   CAD (coronary artery disease) 08/11/2018   Acute systolic heart failure (HCC) 08/11/2018   STEMI (ST elevation myocardial infarction) (HCC) 08/08/2018   STEMI involving left anterior descending coronary artery (HCC) 08/08/2018   NSTEMI (non-ST elevated myocardial infarction) (HCC) 07/31/2018   Hyperlipidemia 07/31/2018   Arthritis, multiple joint involvement 07/31/2018   Disc degeneration, lumbar 09/08/2017   Spondylolisthesis, lumbar region 09/08/2017    Past Surgical History:  Procedure Laterality Date   CARDIAC CATHETERIZATION     CATARACT EXTRACTION, BILATERAL  02/12/2016   CORONARY STENT INTERVENTION  08/02/2018   CORONARY STENT INTERVENTION N/A 08/02/2018   Procedure: CORONARY STENT INTERVENTION;  Surgeon: Mady Bruckner, MD;  Location: [facility];  Service: Cardiovascular;  Laterality: N/A;   CORONARY/GRAFT ACUTE MI REVASCULARIZATION N/A 08/08/2018   Procedure: Coronary/Graft Acute MI Revascularization;  Surgeon: Burnard Debby LABOR, MD;  Location: [facility];  Service: Cardiovascular;  Laterality: N/A;   HYSTERECTOMY ABDOMINAL WITH SALPINGECTOMY  02/12/1971   LEFT HEART CATH AND CORONARY ANGIOGRAPHY N/A 08/02/2018   Procedure: LEFT HEART CATH AND CORONARY ANGIOGRAPHY;  Surgeon: Mady Bruckner, MD;  Location: [facility];  Service: Cardiovascular;  Laterality: N/A;   LEFT HEART CATH AND CORONARY ANGIOGRAPHY N/A 08/08/2018   Procedure: LEFT HEART CATH AND CORONARY ANGIOGRAPHY;  Surgeon: Burnard Debby LABOR, MD;  Location: [facility];  Service: Cardiovascular;  Laterality: N/A;    OB History   No obstetric history on file.      Home Medications    Prior to Admission medications   Medication Sig Start Date End Date Taking? Authorizing Provider  aspirin  EC 81 MG EC tablet Take 1 tablet (81 mg total) by mouth daily. 08/03/18  Yes Bhagat, Bhavinkumar, PA  atorvastatin  (LIPITOR ) 40 MG tablet TAKE 1 TABLET EVERY DAY (NEED MD APPOINTMENT) 03/03/23  Yes Burnard Debby LABOR, MD  Cholecalciferol (VITAMIN D3) 10 MCG (400 UNIT) tablet Take 400 Units by mouth daily.   Yes [provider]  ezetimibe  (ZETIA ) 10 MG tablet TAKE 1 TABLET EVERY DAY (NEED MD APPOINTMENT FOR REFILLS) 12/25/23  Yes Burnard Debby LABOR, MD  metoprolol  succinate (TOPROL -XL) 50 MG 24 hr tablet TAKE 1 TABLET EVERY DAY WITH OR IMMEDIATELY FOLLOWING A MEAL. Please call [phone] to schedule an overdue appointment for future refills. Thank you. 08/12/23  Yes Burnard Debby LABOR, MD  phenazopyridine (PYRIDIUM) 100 MG tablet Take  1 tablet (100 mg total) by mouth 3 (three) times daily as needed for pain. 12/31/23  Yes Sherilyn Windhorst E, PA-C  sulfamethoxazole-trimethoprim (BACTRIM DS) 800-160 MG tablet Take 1 tablet by mouth 2 (two) times daily for 7 days. 12/31/23 01/07/24 Yes Brazen Domangue E, PA-C  acetaminophen  (TYLENOL ) 325 MG tablet Take 325 mg by mouth every 6 (six) hours as needed.    [provider]  atorvastatin  (LIPITOR ) 40 MG tablet Take 40 mg by  mouth daily. Take 1 Tablet Daily    [provider]  atorvastatin  (LIPITOR ) 40 MG tablet Take 1 tablet (40 mg total) by mouth daily. 08/09/21   Burnard Debby LABOR, MD  atorvastatin  (LIPITOR ) 40 MG tablet Take 1 tablet (40 mg total) by mouth daily. 12/18/22   Burnard Debby LABOR, MD  nitroGLYCERIN  (NITROSTAT ) 0.4 MG SL tablet Place 1 tablet (0.4 mg total) under the tongue every 5 (five) minutes as needed for chest pain. 05/21/20 07/18/21  Meng, Hao, PA    Family History Family History  Problem Relation Age of Onset   Microcephaly Mother    Brain cancer Mother    Lung cancer Mother    Heart attack Father 73    Social History Social History   Tobacco Use   Smoking status: Never   Smokeless tobacco: Never  Vaping Use   Vaping status: Never Used  Substance Use Topics   Alcohol use: Never   Drug use: Never     Allergies   Ibuprofen and Nsaids   Review of Systems Review of Systems  Constitutional:  Negative for chills and fever.  Genitourinary:  Positive for flank pain and frequency. Negative for difficulty urinating and dysuria.     Physical Exam Triage Vital Signs ED Triage Vitals  Encounter Vitals Group     BP 12/31/23 1429 (!) 173/83     Girls Systolic BP Percentile --      Girls Diastolic BP Percentile --      Boys Systolic BP Percentile --      Boys Diastolic BP Percentile --      Pulse Rate 12/31/23 1429 68     Resp 12/31/23 1429 16     Temp 12/31/23 1429 98.3 F (36.8 C)     Temp Source 12/31/23 1429 Oral     SpO2 12/31/23 1429 94 %     Weight --      Height --      Head Circumference --      Peak Flow --      Pain Score 12/31/23 1423 8     Pain Loc --      Pain Education --      Exclude from Growth Chart --    No data found.  Updated Vital Signs BP (!) 173/83 (BP Location: Right Arm)   Pulse 68   Temp 98.3 F (36.8 C) (Oral)   Resp 16   SpO2 94%   Visual Acuity Right Eye Distance:   Left Eye Distance:   Bilateral Distance:    Right Eye  Near:   Left Eye Near:    Bilateral Near:     Physical Exam Vitals reviewed.  Constitutional:      General: She is awake.     Appearance: Normal appearance. She is well-developed and well-groomed.  HENT:     Head: Normocephalic and atraumatic.  Eyes:     General: Lids are normal. Gaze aligned appropriately.     Extraocular Movements: Extraocular movements intact.  Conjunctiva/sclera: Conjunctivae normal.  Pulmonary:     Effort: Pulmonary effort is normal.  Abdominal:     Tenderness: There is right CVA tenderness.  Neurological:     Mental Status: She is alert and oriented to person, place, and time.  Psychiatric:        Attention and Perception: Attention and perception normal.        Mood and Affect: Mood and affect normal.        Speech: Speech normal.        Behavior: Behavior normal. Behavior is cooperative.      UC Treatments / Results  Labs (all labs ordered are listed, but only abnormal results are displayed) Labs Reviewed  POCT URINALYSIS DIP (MANUAL ENTRY) - Abnormal; Notable for the following components:      Result Value   Clarity, UA cloudy (*)    Blood, UA moderate (*)    Nitrite, UA Positive (*)    Leukocytes, UA Small (1+) (*)    All other components within normal limits  URINE CULTURE    EKG   Radiology No results found.  Procedures Procedures (including critical care time)  Medications Ordered in UC Medications - No data to display  Initial Impression / Assessment and Plan / UC Course  I have reviewed the triage vital signs and the nursing notes.  Pertinent labs & imaging results that were available during my care of the patient were reviewed by me and considered in my medical decision making (see chart for details).      Final Clinical Impressions(s) / UC Diagnoses   Final diagnoses:  Acute right flank pain  Urinary frequency   Acute, new problem Patient reports symptoms comprised of the following:pressure,flank pain   increased urinary frequency for the past two days  Results of UA are consistent with UTI - urine sample sent for culture to determine causative organism and susceptibility- results to dictate further management  Recommend starting Bactrim PO BID x 7 days  Will provide script - discussed importance of finishing entire course of abx and staying well hydrated while recovering from UTI Will send in script for pyridium as well to help with discomfort  Reviewed ED precautions with patient Follow up as needed for persistent or worsening symptoms    Discharge Instructions      Based on your symptoms and results of the urinalysis I believe you have a UTI I recommend the following:  I have sent in a script for Bactrim to be taken by mouth twice per day for 7 days  Please finish the entire course of the antibiotic even if you are feeling better before it is completed unless you develop an allergic reaction or are told by a medical provider to stop taking it. I am sending in a script for Pyridium for you to take as needed up to 3 times per day for urinary discomfort. This can turn your urine orange so please do not be alarmed if this happens. We have sent a sample of your urine off for a urine culture.  This will help us  determine what bacteria is causing your symptoms as well as the most appropriate antibiotic to treat it.  If we need to make any adjustments to your medication regimen and new medication will be sent to the pharmacy on file and you will be updated via phone call in MyChart. Stay well hydrated (at least 75 oz of water per day) and avoid holding your urine for prolonged periods of  time. If you have any of the following please return to urgent care or go to the emergency room: Persistent symptoms, fever, trouble urinating or inability to urinate, confusion, flank pain.       ED Prescriptions     Medication Sig Dispense Auth. Provider   sulfamethoxazole-trimethoprim (BACTRIM DS) 800-160  MG tablet Take 1 tablet by mouth 2 (two) times daily for 7 days. 14 tablet Kadesia Robel E, PA-C   phenazopyridine (PYRIDIUM) 100 MG tablet Take 1 tablet (100 mg total) by mouth 3 (three) times daily as needed for pain. 10 tablet Thi Sisemore E, PA-C      PDMP not reviewed this encounter.   Marylene Rocky BRAVO, PA-C 12/31/23 [id number]

## 2023-12-31 NOTE — ED Triage Notes (Signed)
 Pt presents to UC for c/o right sided back pain x1 week. Pt states it has started radiating to the front. Reports soreness in lower abdomen. Taking tylenol  and heating pad which makes it worse.

## 2024-01-02 LAB — URINE CULTURE: Culture: >=100000 — AB

## 2024-01-04 ENCOUNTER — Ambulatory Visit (HOSPITAL_COMMUNITY): Payer: Self-pay

## 2024-01-13 ENCOUNTER — Other Ambulatory Visit: Payer: Self-pay

## 2024-01-13 ENCOUNTER — Ambulatory Visit (INDEPENDENT_AMBULATORY_CARE_PROVIDER_SITE_OTHER): Admitting: Radiology

## 2024-01-13 ENCOUNTER — Ambulatory Visit
Admission: RE | Admit: 2024-01-13 | Discharge: 2024-01-13 | Disposition: A | Discharge status: Home / Self Care | Source: Home / Self Care

## 2024-01-13 ENCOUNTER — Emergency Department (HOSPITAL_BASED_OUTPATIENT_CLINIC_OR_DEPARTMENT_OTHER)

## 2024-01-13 ENCOUNTER — Emergency Department (HOSPITAL_BASED_OUTPATIENT_CLINIC_OR_DEPARTMENT_OTHER)
Admission: EM | Admit: 2024-01-13 | Discharge: 2024-01-13 | Disposition: A | Discharge status: Home / Self Care | Source: Ambulatory Visit | Attending: Emergency Medicine | Admitting: Emergency Medicine

## 2024-01-13 ENCOUNTER — Encounter (HOSPITAL_BASED_OUTPATIENT_CLINIC_OR_DEPARTMENT_OTHER): Payer: Self-pay | Admitting: Emergency Medicine

## 2024-01-13 VITALS — BP 191/93 | HR 69 | Temp 98.1°F | Resp 19 | Ht 61.0 in | Wt 173.0 lb

## 2024-01-13 DIAGNOSIS — Z79899 Other long term (current) drug therapy: Secondary | ICD-10-CM | POA: Insufficient documentation

## 2024-01-13 DIAGNOSIS — R109 Unspecified abdominal pain: Secondary | ICD-10-CM | POA: Diagnosis present

## 2024-01-13 DIAGNOSIS — Z7982 Long term (current) use of aspirin: Secondary | ICD-10-CM | POA: Insufficient documentation

## 2024-01-13 DIAGNOSIS — R059 Cough, unspecified: Secondary | ICD-10-CM | POA: Insufficient documentation

## 2024-01-13 DIAGNOSIS — M545 Low back pain, unspecified: Secondary | ICD-10-CM | POA: Diagnosis present

## 2024-01-13 LAB — URINALYSIS, ROUTINE W REFLEX MICROSCOPIC
Bacteria, UA: NONE SEEN
Bilirubin Urine: NEGATIVE
Glucose, UA: NEGATIVE mg/dL
Ketones, ur: NEGATIVE mg/dL
Nitrite: NEGATIVE
Protein, ur: NEGATIVE mg/dL
Specific Gravity, Urine: 1.008 (ref 1.005–1.030)
pH: 5 (ref 5.0–8.0)

## 2024-01-13 LAB — CBC WITH DIFFERENTIAL/PLATELET
Abs Immature Granulocytes: 0.03 K/uL (ref 0.00–0.07)
Basophils Absolute: 0 K/uL (ref 0.0–0.1)
Basophils Relative: 1 %
Eosinophils Absolute: 0.1 K/uL (ref 0.0–0.5)
Eosinophils Relative: 1 %
HCT: 38.9 % (ref 36.0–46.0)
Hemoglobin: 13 g/dL (ref 12.0–15.0)
Immature Granulocytes: 0 %
Lymphocytes Relative: 46 %
Lymphs Abs: 4.1 K/uL — ABNORMAL HIGH (ref 0.7–4.0)
MCH: 31.9 pg (ref 26.0–34.0)
MCHC: 33.4 g/dL (ref 30.0–36.0)
MCV: 95.6 fL (ref 80.0–100.0)
Monocytes Absolute: 0.6 K/uL (ref 0.1–1.0)
Monocytes Relative: 7 %
Neutro Abs: 3.9 K/uL (ref 1.7–7.7)
Neutrophils Relative %: 45 %
Platelets: 331 K/uL (ref 150–400)
RBC: 4.07 MIL/uL (ref 3.87–5.11)
RDW: 13.1 % (ref 11.5–15.5)
WBC: 8.8 K/uL (ref 4.0–10.5)
nRBC: 0 % (ref 0.0–0.2)

## 2024-01-13 LAB — POCT URINALYSIS DIP (MANUAL ENTRY)
Bilirubin, UA: NEGATIVE
Glucose, UA: NEGATIVE mg/dL
Ketones, POC UA: NEGATIVE mg/dL
Nitrite, UA: NEGATIVE
Protein Ur, POC: NEGATIVE mg/dL
Spec Grav, UA: >=1.03 — AB (ref 1.010–1.025)
Urobilinogen, UA: 0.2 U/dL
pH, UA: 5.5 (ref 5.0–8.0)

## 2024-01-13 LAB — COMPREHENSIVE METABOLIC PANEL WITH GFR
ALT: 13 U/L (ref 0–44)
AST: 25 U/L (ref 15–41)
Albumin: 4.2 g/dL (ref 3.5–5.0)
Alkaline Phosphatase: 67 U/L (ref 38–126)
Anion gap: 14 (ref 5–15)
BUN: 16 mg/dL (ref 8–23)
CO2: 22 mmol/L (ref 22–32)
Calcium: 9.6 mg/dL (ref 8.9–10.3)
Chloride: 105 mmol/L (ref 98–111)
Creatinine, Ser: 0.68 mg/dL (ref 0.44–1.00)
GFR, Estimated: >60 mL/min (ref >60)
Glucose, Bld: 103 mg/dL — ABNORMAL HIGH (ref 70–99)
Potassium: 3.6 mmol/L (ref 3.5–5.1)
Sodium: 140 mmol/L (ref 135–145)
Total Bilirubin: 0.4 mg/dL (ref 0.0–1.2)
Total Protein: 7.1 g/dL (ref 6.5–8.1)

## 2024-01-13 LAB — LIPASE, BLOOD: Lipase: 28 U/L (ref 11–51)

## 2024-01-13 MED ORDER — ONDANSETRON HCL 4 MG/2ML IJ SOLN
4.0000 mg | Freq: Once | INTRAMUSCULAR | Status: AC
  Administered 2024-01-13: 4 mg via INTRAVENOUS
  Filled 2024-01-13: qty 2

## 2024-01-13 MED ORDER — MORPHINE SULFATE (PF) 4 MG/ML IV SOLN
4.0000 mg | Freq: Once | INTRAVENOUS | Status: AC
  Administered 2024-01-13: 4 mg via INTRAVENOUS
  Filled 2024-01-13: qty 1

## 2024-01-13 MED ORDER — ONDANSETRON 4 MG PO TBDP: ORAL_TABLET | ORAL | 0 refills | Status: DC

## 2024-01-13 MED ORDER — MORPHINE SULFATE 15 MG PO TABS: 7.5000 mg | ORAL_TABLET | ORAL | 0 refills | Status: DC | PRN

## 2024-01-13 MED ORDER — CEFPODOXIME PROXETIL 100 MG PO TABS: 100.0000 mg | ORAL_TABLET | Freq: Two times a day (BID) | ORAL | 0 refills | Status: DC

## 2024-01-13 MED ORDER — DICLOFENAC SODIUM 1 % EX GEL
4.0000 g | Freq: Four times a day (QID) | CUTANEOUS | 0 refills | Status: DC
Start: 2024-01-13 — End: 2024-01-13

## 2024-01-13 MED ORDER — DICLOFENAC SODIUM 1 % EX GEL: 4.0000 g | Freq: Four times a day (QID) | CUTANEOUS | 0 refills | Status: DC

## 2024-01-13 MED ORDER — SODIUM CHLORIDE 0.9 % IV BOLUS
1000.0000 mL | Freq: Once | INTRAVENOUS | Status: AC
  Administered 2024-01-13: 1000 mL via INTRAVENOUS

## 2024-01-13 MED ORDER — CEFPODOXIME PROXETIL 100 MG PO TABS: 100.0000 mg | ORAL_TABLET | Freq: Two times a day (BID) | ORAL | 0 refills | Status: AC

## 2024-01-13 NOTE — ED Provider Notes (Signed)
 [facility]    CSN: [id number] Arrival date & time: 01/13/24  1800      History   Chief Complaint Chief Complaint  Patient presents with   Back Pain    Having sever back and sude pain - Entered by patient    HPI Lynn Hogan is a 78 y.o. female.   HPI  Pt presents today with concerns for back pain and RUQ abdominal pain  She reports some dysuria which is similar to what she was having on 12/31/23  She reports these symptoms did resolve for a few days when she was started on ABX but quickly returned once she finished therapy She reports significant right flank and abdominal pain since symptoms returned   Past Medical History:  Diagnosis Date   Coronary artery disease    Hyperlipidemia    NSTEMI (non-ST elevated myocardial infarction) (HCC)    08/02/2018-PCI/DES to RCA    STEMI (ST elevation myocardial infarction) (HCC)    08/08/2018-PCI/DES to mLAD, 85% osital stenosis of 1st diag.    Systolic heart failure [facility])     Patient Active Problem List   Diagnosis Date Noted   CAD (coronary artery disease) 08/11/2018   Acute systolic heart failure (HCC) 08/11/2018   STEMI (ST elevation myocardial infarction) (HCC) 08/08/2018   STEMI involving left anterior descending coronary artery (HCC) 08/08/2018   NSTEMI (non-ST elevated myocardial infarction) (HCC) 07/31/2018   Hyperlipidemia 07/31/2018   Arthritis, multiple joint involvement 07/31/2018   Disc degeneration, lumbar 09/08/2017   Spondylolisthesis, lumbar region 09/08/2017    Past Surgical History:  Procedure Laterality Date   CARDIAC CATHETERIZATION     CATARACT EXTRACTION, BILATERAL  02/12/2016   CORONARY STENT INTERVENTION  08/02/2018   CORONARY STENT INTERVENTION N/A 08/02/2018   Procedure: CORONARY STENT INTERVENTION;  Surgeon: Mady Bruckner, MD;  [facility];  Service: Cardiovascular;  Laterality: N/A;   CORONARY/GRAFT ACUTE MI REVASCULARIZATION N/A 08/08/2018   Procedure: Coronary/Graft  Acute MI Revascularization;  Surgeon: Burnard Debby LABOR, MD;  [facility]: [facility];  Service: Cardiovascular;  Laterality: N/A;   HYSTERECTOMY ABDOMINAL WITH SALPINGECTOMY  02/12/1971   LEFT HEART CATH AND CORONARY ANGIOGRAPHY N/A 08/02/2018   Procedure: LEFT HEART CATH AND CORONARY ANGIOGRAPHY;  Surgeon: Mady Bruckner, MD;  [facility]: [facility];  Service: Cardiovascular;  Laterality: N/A;   LEFT HEART CATH AND CORONARY ANGIOGRAPHY N/A 08/08/2018   Procedure: LEFT HEART CATH AND CORONARY ANGIOGRAPHY;  Surgeon: Burnard Debby LABOR, MD;  Location: [facility];  Service: Cardiovascular;  Laterality: N/A;    OB History   No obstetric history on file.      Home Medications    Prior to Admission medications   Medication Sig Start Date End Date Taking? Authorizing Provider  acetaminophen  (TYLENOL ) 325 MG tablet Take 325 mg by mouth every 6 (six) hours as needed.    [provider]  aspirin  EC 81 MG EC tablet Take 1 tablet (81 mg total) by mouth daily. 08/03/18   Bhagat, Aleene, PA  atorvastatin  (LIPITOR ) 40 MG tablet Take 40 mg by mouth daily. Take 1 Tablet Daily    [provider]  atorvastatin  (LIPITOR ) 40 MG tablet Take 1 tablet (40 mg total) by mouth daily. 08/09/21   Burnard Debby LABOR, MD  atorvastatin  (LIPITOR ) 40 MG tablet Take 1 tablet (40 mg total) by mouth daily. 12/18/22   Burnard Debby LABOR, MD  atorvastatin  (LIPITOR ) 40 MG tablet TAKE 1 TABLET EVERY DAY (NEED  MD APPOINTMENT) 03/03/23   Burnard Debby LABOR, MD  Cholecalciferol (VITAMIN D3) 10 MCG (400 UNIT) tablet Take 400 Units by mouth daily.    [provider]  ezetimibe  (ZETIA ) 10 MG tablet TAKE 1 TABLET EVERY DAY (NEED MD APPOINTMENT FOR REFILLS) 12/25/23   Burnard Debby LABOR, MD  metoprolol  succinate (TOPROL -XL) 50 MG 24 hr tablet TAKE 1 TABLET EVERY DAY WITH OR IMMEDIATELY FOLLOWING A MEAL. Please call [phone] to schedule an overdue appointment for future refills. Thank you. 08/12/23   Burnard Debby LABOR, MD  nitroGLYCERIN  (NITROSTAT ) 0.4 MG SL tablet Place 1 tablet (0.4 mg total) under the tongue every 5 (five) minutes as needed for chest pain. 05/21/20 07/18/21  Meng, Hao, PA  phenazopyridine  (PYRIDIUM ) 100 MG tablet Take 1 tablet (100 mg total) by mouth 3 (three) times daily as needed for pain. 12/31/23   Kennita Pavlovich, Rocky BRAVO, PA-C    Family History Family History  Problem Relation Age of Onset   Microcephaly Mother    Brain cancer Mother    Lung cancer Mother    Heart attack Father 12    Social History Social History   Tobacco Use   Smoking status: Never   Smokeless tobacco: Never  Vaping Use   Vaping status: Never Used  Substance Use Topics   Alcohol use: Never   Drug use: Never     Allergies   Ibuprofen and Nsaids   Review of Systems Review of Systems  Constitutional:  Positive for appetite change.  Gastrointestinal:  Positive for abdominal pain.  Genitourinary:  Positive for flank pain and frequency.  Musculoskeletal:  Positive for back pain.     Physical Exam Triage Vital Signs ED Triage Vitals  Encounter Vitals Group     BP 01/13/24 1812 (!) 191/93     Girls Systolic BP Percentile --      Girls Diastolic BP Percentile --      Boys Systolic BP Percentile --      Boys Diastolic BP Percentile --      Pulse Rate 01/13/24 1812 69     Resp 01/13/24 1812 19     Temp 01/13/24 1812 98.1 F (36.7 C)     Temp Source 01/13/24 1812 Oral     SpO2 01/13/24 1812 95 %     Weight 01/13/24 1814 173 lb (78.5 kg)     Height 01/13/24 1814 5' 1 (1.549 m)     Head Circumference --      Peak Flow --      Pain Score 01/13/24 1813 9     Pain Loc --      Pain Education --      Exclude from Growth Chart --    No data found.  Updated Vital Signs BP (!) 191/93 (BP Location: Right Arm)   Pulse 69   Temp 98.1 F (36.7 C) (Oral)   Resp 19   Ht 5' 1 (1.549 m)   Wt 173 lb (78.5 kg)   SpO2 95%   BMI 32.69 kg/m   Visual Acuity Right Eye Distance:   Left Eye  Distance:   Bilateral Distance:    Right Eye Near:   Left Eye Near:    Bilateral Near:     Physical Exam Vitals reviewed.  Constitutional:      General: She is awake.     Appearance: Normal appearance. She is well-developed and well-groomed.  HENT:     Head: Normocephalic and atraumatic.  Eyes:  General: Lids are normal. Gaze aligned appropriately.     Extraocular Movements: Extraocular movements intact.     Conjunctiva/sclera: Conjunctivae normal.  Pulmonary:     Effort: Pulmonary effort is normal.  Neurological:     Mental Status: She is alert and oriented to person, place, and time.  Psychiatric:        Attention and Perception: Attention and perception normal.        Mood and Affect: Mood and affect normal.        Speech: Speech normal.        Behavior: Behavior normal. Behavior is cooperative.      UC Treatments / Results  Labs (all labs ordered are listed, but only abnormal results are displayed) Labs Reviewed  POCT URINALYSIS DIP (MANUAL ENTRY) - Abnormal; Notable for the following components:      Result Value   Spec Grav, UA >=1.030 (*)    Blood, UA moderate (*)    Leukocytes, UA Trace (*)    All other components within normal limits  URINE CULTURE    EKG   Radiology DG Abdomen 1 View Result Date: 01/13/2024 EXAM: 1 VIEW XRAY OF THE ABDOMEN 01/13/2024 06:34:02 PM COMPARISON: 08/21/2017 CLINICAL HISTORY: Flank pain, hematuria. Pt presents with complaints of ongoing back pain x 3 weeks. States she was seen here on 7/3. Diagnosed with UTI. Antibiotics are completed. Having urinary frequency today. Urine was frothy the last time patient urinated. Also reporting RUQ pain. Currently rates overall pain a 9/10. Loss of appetite reported as well. FINDINGS: BOWEL: Large chronic stool burden. SOFT TISSUES: Phleboliths in the left lower abdomen and pelvis. BONES: No acute osseous abnormality. KIDNEYS, URETERS, BLADDER: Question tiny punctate stone in the upper left  kidney. IMPRESSION: 1. Questionable tiny punctate stone in the upper left kidney. 2. Large chronic stool burden. Electronically signed by: Norman Gatlin MD 01/13/2024 06:43 PM EDT RP Workstation: [id number]    Procedures Procedures (including critical care time)  Medications Ordered in UC Medications - No data to display  Initial Impression / Assessment and Plan / UC Course  I have reviewed the triage vital signs and the nursing notes.  Pertinent labs & imaging results that were available during my care of the patient were reviewed by me and considered in my medical decision making (see chart for details).      Final Clinical Impressions(s) / UC Diagnoses   Final diagnoses:  Flank pain   Patient presents today with concerns for right-sided flank pain with radiation into the abdomen that is been ongoing intermittently for about 3 weeks.  She reports that symptoms seem to improve while she was treated with antibiotics for UTI on 12/31/2023 but her symptoms quickly returned.  She reports having urinary frequency as well.  Urine dip is notable for leukocytes and red blood cells.  Will send for urine culture for further evaluation.  KUB does demonstrate a potential punctate stone in the left upper kidney but right kidney is obstructed by stool burden.  Given findings as well as patient's significant pain level I recommend evaluation in the emergency room for CT stone study and pain management.  At this time I suspect nephrolithiasis but cannot rule out infected stone, pyelonephritis, acute cystitis.  Patient is amenable to going to the ED via private vehicle and declines EMS transportation.Follow up as needed for persistent or progressing symptoms     Discharge Instructions      You were seen today for concerns of flank pain and  abdominal pain. I am suspicious that you may have a kidney stone that is infected I recommend that you go to the ED or a Med[facility] to get a CT scan and pain  management to rule this out. Your urine was notable for signs of an UTI but since you are going to the ED they will likely treat this there.  We will keep you updated on your urine culture results once they are available and if further medications are needed.       ED Prescriptions   None    PDMP not reviewed this encounter.   Marylene Rocky FORBES DEVONNA 01/13/24 2011

## 2024-01-13 NOTE — ED Notes (Signed)
 Patient is being discharged from the Urgent Care and sent to the Emergency Department via private vehicle with daughter. Per Rocky Mecum PA, patient is in need of higher level of care due to severe back and abdominal pain x 3 weeks. Patient is aware and verbalizes understanding of plan of care.   Vitals:   01/13/24 1812  BP: (!) 191/93  Pulse: 69  Resp: 19  Temp: 98.1 F (36.7 C)  SpO2: 95%

## 2024-01-13 NOTE — Discharge Instructions (Signed)
 I am going to treat you like you have a urinary tract infection.  I have started you on a new antibiotic.  Please take it as prescribed.  Your back pain is most likely due to a muscular strain.  There is been a lot of research on back pain, unfortunately the only thing that seems to really help is Tylenol  and ibuprofen.  Relative rest is also important to not lift greater than 10 pounds bending or twisting at the waist.  Please follow-up with your family physician.  The other thing that really seems to benefit patients is physical therapy which your doctor may send you for.  Please return to the emergency department for new numbness or weakness to your arms or legs. Difficulty with urinating or urinating or pooping on yourself.  Also if you cannot feel toilet paper when you wipe or get a fever.   Use the gel as prescribed.  Also take tylenol  1000mg (2 extra strength) four times a day.

## 2024-01-13 NOTE — ED Triage Notes (Signed)
 UTI treated 2 weeks ago Seen at [facility] [place] sent to get checked out for kidney stone Right side into back

## 2024-01-13 NOTE — ED Triage Notes (Signed)
 Pt presents with complaints of ongoing back pain x 3 weeks. States she was seen here on 7/3. Diagnosed with UTI. Antibiotics are completed. Having urinary frequency today. Urine was frothy the last time patient urinated.  Also reporting RUQ pain. Currently rates overall pain a 9/10. Loss of appetite reported as well.

## 2024-01-13 NOTE — ED Provider Notes (Signed)
 [name] [facility] AT [facility] Provider Note   CSN: [id number] Arrival date & time: 01/13/24  1918     Patient presents with: Abdominal Pain   Lynn Hogan is a 78 y.o. female.   78 yo F with a cc of right low back pain.  Going on for a couple weeks now.  Worse with twisting turning movement and deep breathing.  She has been coughing a little bit and has not wanted to cough because it makes it hurt.  She went to urgent care and was diagnosed with a urinary tract infection.  She had some transient improvement after antibiotic use but redeveloped pain and went back there today.  They were concerned about a possible kidney stone and sent her here for advanced imaging.  She describes the pain as like a spasm.  Seems to come and go.  Denies radiation down the leg denies loss of bowel or bladder denies loss of perirectal sensation.  Denies trauma.   Abdominal Pain      Prior to Admission medications   Medication Sig Start Date End Date Taking? Authorizing Provider  morphine  (MSIR) 15 MG tablet Take 0.5 tablets (7.5 mg total) by mouth every 4 (four) hours as needed for severe pain (pain score 7-10). 01/13/24  Yes Emil Share, DO  ondansetron  (ZOFRAN -ODT) 4 MG disintegrating tablet 4mg  ODT q4 hours prn nausea/vomit 01/13/24  Yes Sylvana Bonk, DO  acetaminophen  (TYLENOL ) 325 MG tablet Take 325 mg by mouth every 6 (six) hours as needed.    [provider]  aspirin  EC 81 MG EC tablet Take 1 tablet (81 mg total) by mouth daily. 08/03/18   Bhagat, Aleene, PA  atorvastatin  (LIPITOR ) 40 MG tablet Take 40 mg by mouth daily. Take 1 Tablet Daily    [provider]  atorvastatin  (LIPITOR ) 40 MG tablet Take 1 tablet (40 mg total) by mouth daily. 08/09/21   Burnard Debby LABOR, MD  atorvastatin  (LIPITOR ) 40 MG tablet Take 1 tablet (40 mg total) by mouth daily. 12/18/22   Burnard Debby LABOR, MD  atorvastatin  (LIPITOR ) 40 MG tablet TAKE 1 TABLET EVERY DAY (NEED MD  APPOINTMENT) 03/03/23   Burnard Debby LABOR, MD  cefpodoxime  (VANTIN ) 100 MG tablet Take 1 tablet (100 mg total) by mouth 2 (two) times daily for 10 days. 01/13/24 01/23/24  Emil Share, DO  Cholecalciferol (VITAMIN D3) 10 MCG (400 UNIT) tablet Take 400 Units by mouth daily.    [provider]  diclofenac  Sodium (VOLTAREN ) 1 % GEL Apply 4 g topically 4 (four) times daily. 01/13/24   Emil Share, DO  ezetimibe  (ZETIA ) 10 MG tablet TAKE 1 TABLET EVERY DAY (NEED MD APPOINTMENT FOR REFILLS) 12/25/23   Burnard Debby LABOR, MD  metoprolol  succinate (TOPROL -XL) 50 MG 24 hr tablet TAKE 1 TABLET EVERY DAY WITH OR IMMEDIATELY FOLLOWING A MEAL. Please call [phone] to schedule an overdue appointment for future refills. Thank you. 08/12/23   Burnard Debby LABOR, MD  nitroGLYCERIN  (NITROSTAT ) 0.4 MG SL tablet Place 1 tablet (0.4 mg total) under the tongue every 5 (five) minutes as needed for chest pain. 05/21/20 07/18/21  Meng, Hao, PA  phenazopyridine  (PYRIDIUM ) 100 MG tablet Take 1 tablet (100 mg total) by mouth 3 (three) times daily as needed for pain. 12/31/23   Mecum, Erin E, PA-C    Allergies: Ibuprofen and Nsaids    Review of Systems  Gastrointestinal:  Positive for abdominal pain.    Updated Vital Signs BP (!) 170/74  Pulse (!) 59   Temp 98.7 F (37.1 C) (Oral)   Resp 17   SpO2 96%   Physical Exam Vitals and nursing note reviewed.  Constitutional:      General: She is not in acute distress.    Appearance: She is well-developed. She is not diaphoretic.  HENT:     Head: Normocephalic and atraumatic.  Eyes:     Pupils: Pupils are equal, round, and reactive to light.  Cardiovascular:     Rate and Rhythm: Normal rate and regular rhythm.     Heart sounds: No murmur heard.    No friction rub. No gallop.  Pulmonary:     Effort: Pulmonary effort is normal.     Breath sounds: No wheezing or rales.  Abdominal:     General: There is no distension.     Palpations: Abdomen is soft.     Tenderness:  There is no abdominal tenderness.  Musculoskeletal:        General: No tenderness.     Cervical back: Normal range of motion and neck supple.     Comments: Pulse motor and sensation intact bilateral lower extremities.  No clonus.  Skin:    General: Skin is warm and dry.  Neurological:     Mental Status: She is alert and oriented to person, place, and time.  Psychiatric:        Behavior: Behavior normal.     (all labs ordered are listed, but only abnormal results are displayed) Labs Reviewed  CBC WITH DIFFERENTIAL/PLATELET - Abnormal; Notable for the following components:      Result Value   Lymphs Abs 4.1 (*)    All other components within normal limits  URINALYSIS, ROUTINE W REFLEX MICROSCOPIC - Abnormal; Notable for the following components:   Color, Urine COLORLESS (*)    Hgb urine dipstick SMALL (*)    Leukocytes,Ua SMALL (*)    All other components within normal limits  COMPREHENSIVE METABOLIC PANEL WITH GFR - Abnormal; Notable for the following components:   Glucose, Bld 103 (*)    All other components within normal limits  LIPASE, BLOOD    EKG: None  Radiology: CT Renal Stone Study Result Date: 01/13/2024 CLINICAL DATA:  Abdominal and flank pain.  Right-sided. EXAM: CT ABDOMEN AND PELVIS WITHOUT CONTRAST TECHNIQUE: Multidetector CT imaging of the abdomen and pelvis was performed following the standard protocol without IV contrast. RADIATION DOSE REDUCTION: This exam was performed according to the departmental dose-optimization program which includes automated exposure control, adjustment of the mA and/or kV according to patient size and/or use of iterative reconstruction technique. COMPARISON:  None Available. FINDINGS: Lower chest: No acute abnormality. Hepatobiliary: No focal liver abnormality is seen. No gallstones, gallbladder wall thickening, or biliary dilatation. Pancreas: Unremarkable. No pancreatic ductal dilatation or surrounding inflammatory changes. Spleen:  Normal in size without focal abnormality. Adrenals/Urinary Tract: Adrenal glands are unremarkable. Kidneys are normal, without renal calculi, focal lesion, or hydronephrosis. There is diffuse bladder wall thickening with mild surrounding inflammation. Stomach/Bowel: Stomach is within normal limits. Appendix is not visualized. No evidence of bowel wall thickening, distention, or inflammatory changes. Vascular/Lymphatic: Aortic atherosclerosis. No enlarged abdominal or pelvic lymph nodes. Reproductive: Status post hysterectomy. No adnexal masses. Other: There is a small fat containing umbilical hernia. There is no ascites. 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 Musculoskeletal: There are bilateral pars interarticularis defects at L5. There is 7 mm of anterolisthesis at L5-S1. IMPRESSION: 1. Diffuse bladder wall thickening with mild  surrounding inflammation compatible with cystitis. 2. No nephrolithiasis or hydronephrosis. 3. 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 4. Aortic atherosclerosis. Aortic Atherosclerosis (ICD10-I70.0). Electronically Signed   By: Greig Pique M.D.   On: 01/13/2024 20:30   DG Chest Port 1 View Result Date: 01/13/2024 EXAM: 1 VIEW XRAY OF THE CHEST 01/13/2024 08:02:00 PM COMPARISON: Chest radiograph dated 08/08/2018. CLINICAL HISTORY: Pain with cough. Patient presents with complaints of ongoing back pain for 3 weeks. States she was seen here on 7/3. Diagnosed with UTI. Antibiotics are completed. Having urinary frequency today. Urine was frothy the last time patient urinated. Also reporting RUQ pain. Currently rates overall pain a 9/10. Loss of appetite reported as well. FINDINGS: LUNGS AND PLEURA: No focal consolidation, pleural effusion, or pneumothorax. HEART AND MEDIASTINUM: Stable cardiomegaly. Pulmonary vascular congestion. Coronary stenting. BONES AND SOFT TISSUES: No acute osseous abnormality. IMPRESSION: 1.  Cardiomegaly and pulmonary vascular congestion. Electronically signed by: Norman Gatlin MD 01/13/2024 08:17 PM EDT RP Workstation: [id number]   DG Abdomen 1 View Result Date: 01/13/2024 EXAM: 1 VIEW XRAY OF THE ABDOMEN 01/13/2024 06:34:02 PM COMPARISON: 08/21/2017 CLINICAL HISTORY: Flank pain, hematuria. Pt presents with complaints of ongoing back pain x 3 weeks. States she was seen here on 7/3. Diagnosed with UTI. Antibiotics are completed. Having urinary frequency today. Urine was frothy the last time patient urinated. Also reporting RUQ pain. Currently rates overall pain a 9/10. Loss of appetite reported as well. FINDINGS: BOWEL: Large chronic stool burden. SOFT TISSUES: Phleboliths in the left lower abdomen and pelvis. BONES: No acute osseous abnormality. KIDNEYS, URETERS, BLADDER: Question tiny punctate stone in the upper left kidney. IMPRESSION: 1. Questionable tiny punctate stone in the upper left kidney. 2. Large chronic stool burden. Electronically signed by: Norman Gatlin MD 01/13/2024 06:43 PM EDT RP Workstation: [id number]     Procedures   Medications Ordered in the ED  sodium chloride  0.9 % bolus 1,000 mL (1,000 mLs Intravenous New Bag/Given 01/13/24 2013)  morphine  (PF) 4 MG/ML injection 4 mg (4 mg Intravenous Given 01/13/24 2008)  ondansetron  (ZOFRAN ) injection 4 mg (4 mg Intravenous Given 01/13/24 2008)                                    Medical Decision Making Amount and/or Complexity of Data Reviewed Labs: ordered. Radiology: ordered.  Risk Prescription drug management.   78 yo F with a chief complaints of right lower back pain.  Going on for a few weeks now.  Seen in urgent care and was diagnosed with a urinary tract infection.  Had transient improvement with antibiotics but had worsening.  She had a urine dip consistent with hematuria and she was sent here to rule out nephrolithiasis.  Will treat pain and nausea.  Bolus of IV fluids.  CT stone study.   CT stone  study without nephrolithiasis.  No ureteral lithiasis.  Some bladder wall thickening radiology read is possible cystitis.  UA without obvious infection.  LFTs and lipase are unremarkable.  No leukocytosis.  I will treat for possible cystitis.  Will also treat for possible musculoskeletal low back pain.  Family is requesting a short course of pain medicine for home.  9:26 PM:  I have discussed the diagnosis/risks/treatment options with the patient.  Evaluation and diagnostic testing in the emergency department does not suggest an emergent condition requiring admission or immediate intervention beyond what has  been performed at this time.  They will follow up with PCP. We also discussed returning to the ED immediately if new or worsening sx occur. We discussed the sx which are most concerning (e.g., sudden worsening pain, fever, inability to tolerate by mouth) that necessitate immediate return. Medications administered to the patient during their visit and any new prescriptions provided to the patient are listed below.  Medications given during this visit Medications  sodium chloride  0.9 % bolus 1,000 mL (1,000 mLs Intravenous New Bag/Given 01/13/24 2013)  morphine  (PF) 4 MG/ML injection 4 mg (4 mg Intravenous Given 01/13/24 2008)  ondansetron  (ZOFRAN ) injection 4 mg (4 mg Intravenous Given 01/13/24 2008)     The patient appears reasonably screen and/or stabilized for discharge and I doubt any other medical condition or other [facility] requiring further screening, evaluation, or treatment in the ED at this time prior to discharge.        Final diagnoses:  Acute right-sided low back pain without sciatica    ED Discharge Orders          Ordered    cefpodoxime  (VANTIN ) 100 MG tablet  2 times daily,   Status:  Discontinued        01/13/24 2056    diclofenac  Sodium (VOLTAREN ) 1 % GEL  4 times daily,   Status:  Discontinued        01/13/24 2056    diclofenac  Sodium (VOLTAREN ) 1 % GEL  4 times daily         01/13/24 2111    cefpodoxime  (VANTIN ) 100 MG tablet  2 times daily        01/13/24 2111    morphine  (MSIR) 15 MG tablet  Every 4 hours PRN        01/13/24 2111    ondansetron  (ZOFRAN -ODT) 4 MG disintegrating tablet        01/13/24 2111               Emil Share, DO 01/13/24 2126

## 2024-01-13 NOTE — Discharge Instructions (Addendum)
 You were seen today for concerns of flank pain and abdominal pain. I am suspicious that you may have a kidney stone that is infected I recommend that you go to the ED or a Med[facility] to get a CT scan and pain management to rule this out. Your urine was notable for signs of an UTI but since you are going to the ED they will likely treat this there.  We will keep you updated on your urine culture results once they are available and if further medications are needed.

## 2024-01-14 LAB — URINE CULTURE: Culture: NO GROWTH

## 2024-01-15 ENCOUNTER — Ambulatory Visit (HOSPITAL_COMMUNITY): Payer: Self-pay

## 2024-01-25 DIAGNOSIS — I10 Essential (primary) hypertension: Secondary | ICD-10-CM | POA: Diagnosis present

## 2024-01-25 DIAGNOSIS — I255 Ischemic cardiomyopathy: Secondary | ICD-10-CM | POA: Insufficient documentation

## 2024-01-25 DIAGNOSIS — M899 Disorder of bone, unspecified: Secondary | ICD-10-CM | POA: Diagnosis present

## 2024-01-25 DIAGNOSIS — Z8679 Personal history of other diseases of the circulatory system: Secondary | ICD-10-CM

## 2024-03-21 ENCOUNTER — Observation Stay (HOSPITAL_COMMUNITY)

## 2024-03-21 ENCOUNTER — Emergency Department (HOSPITAL_BASED_OUTPATIENT_CLINIC_OR_DEPARTMENT_OTHER)

## 2024-03-21 ENCOUNTER — Inpatient Hospital Stay (HOSPITAL_BASED_OUTPATIENT_CLINIC_OR_DEPARTMENT_OTHER)
Admission: EM | Admit: 2024-03-21 | Discharge: 2024-04-09 | DRG: 821 | Disposition: A | Discharge status: Home / Self Care | Attending: Internal Medicine | Admitting: Internal Medicine

## 2024-03-21 ENCOUNTER — Other Ambulatory Visit: Payer: Self-pay

## 2024-03-21 ENCOUNTER — Encounter (HOSPITAL_BASED_OUTPATIENT_CLINIC_OR_DEPARTMENT_OTHER): Payer: Self-pay | Admitting: Emergency Medicine

## 2024-03-21 DIAGNOSIS — Z955 Presence of coronary angioplasty implant and graft: Secondary | ICD-10-CM

## 2024-03-21 DIAGNOSIS — I5022 Chronic systolic (congestive) heart failure: Secondary | ICD-10-CM | POA: Diagnosis present

## 2024-03-21 DIAGNOSIS — Z888 Allergy status to other drugs, medicaments and biological substances status: Secondary | ICD-10-CM

## 2024-03-21 DIAGNOSIS — M4804 Spinal stenosis, thoracic region: Secondary | ICD-10-CM | POA: Diagnosis not present

## 2024-03-21 DIAGNOSIS — G992 Myelopathy in diseases classified elsewhere: Secondary | ICD-10-CM | POA: Diagnosis not present

## 2024-03-21 DIAGNOSIS — G893 Neoplasm related pain (acute) (chronic): Secondary | ICD-10-CM | POA: Diagnosis present

## 2024-03-21 DIAGNOSIS — C8332 Diffuse large B-cell lymphoma, intrathoracic lymph nodes: Secondary | ICD-10-CM | POA: Diagnosis not present

## 2024-03-21 DIAGNOSIS — Z8249 Family history of ischemic heart disease and other diseases of the circulatory system: Secondary | ICD-10-CM

## 2024-03-21 DIAGNOSIS — Z87442 Personal history of urinary calculi: Secondary | ICD-10-CM

## 2024-03-21 DIAGNOSIS — Z85820 Personal history of malignant melanoma of skin: Secondary | ICD-10-CM | POA: Diagnosis not present

## 2024-03-21 DIAGNOSIS — M8448XA Pathological fracture, other site, initial encounter for fracture: Secondary | ICD-10-CM | POA: Diagnosis not present

## 2024-03-21 DIAGNOSIS — I2510 Atherosclerotic heart disease of native coronary artery without angina pectoris: Secondary | ICD-10-CM | POA: Diagnosis not present

## 2024-03-21 DIAGNOSIS — Z90722 Acquired absence of ovaries, bilateral: Secondary | ICD-10-CM

## 2024-03-21 DIAGNOSIS — C4362 Malignant melanoma of left upper limb, including shoulder: Secondary | ICD-10-CM | POA: Diagnosis present

## 2024-03-21 DIAGNOSIS — C7951 Secondary malignant neoplasm of bone: Principal | ICD-10-CM | POA: Diagnosis present

## 2024-03-21 DIAGNOSIS — R110 Nausea: Secondary | ICD-10-CM | POA: Diagnosis not present

## 2024-03-21 DIAGNOSIS — Z90710 Acquired absence of both cervix and uterus: Secondary | ICD-10-CM | POA: Diagnosis not present

## 2024-03-21 DIAGNOSIS — R7303 Prediabetes: Secondary | ICD-10-CM | POA: Diagnosis present

## 2024-03-21 DIAGNOSIS — Z801 Family history of malignant neoplasm of trachea, bronchus and lung: Secondary | ICD-10-CM

## 2024-03-21 DIAGNOSIS — R001 Bradycardia, unspecified: Secondary | ICD-10-CM | POA: Diagnosis present

## 2024-03-21 DIAGNOSIS — I255 Ischemic cardiomyopathy: Secondary | ICD-10-CM | POA: Diagnosis not present

## 2024-03-21 DIAGNOSIS — I10 Essential (primary) hypertension: Secondary | ICD-10-CM | POA: Diagnosis present

## 2024-03-21 DIAGNOSIS — C8510 Unspecified B-cell lymphoma, unspecified site: Secondary | ICD-10-CM

## 2024-03-21 DIAGNOSIS — E785 Hyperlipidemia, unspecified: Secondary | ICD-10-CM | POA: Diagnosis not present

## 2024-03-21 DIAGNOSIS — M899 Disorder of bone, unspecified: Secondary | ICD-10-CM | POA: Diagnosis present

## 2024-03-21 DIAGNOSIS — Z7409 Other reduced mobility: Secondary | ICD-10-CM | POA: Diagnosis present

## 2024-03-21 DIAGNOSIS — R000 Tachycardia, unspecified: Secondary | ICD-10-CM | POA: Diagnosis present

## 2024-03-21 DIAGNOSIS — Z981 Arthrodesis status: Secondary | ICD-10-CM

## 2024-03-21 DIAGNOSIS — Y93K1 Activity, walking an animal: Secondary | ICD-10-CM

## 2024-03-21 DIAGNOSIS — Z808 Family history of malignant neoplasm of other organs or systems: Secondary | ICD-10-CM

## 2024-03-21 DIAGNOSIS — Y92239 Unspecified place in hospital as the place of occurrence of the external cause: Secondary | ICD-10-CM | POA: Diagnosis not present

## 2024-03-21 DIAGNOSIS — Z751 Person awaiting admission to adequate facility elsewhere: Secondary | ICD-10-CM

## 2024-03-21 DIAGNOSIS — Z9079 Acquired absence of other genital organ(s): Secondary | ICD-10-CM | POA: Diagnosis not present

## 2024-03-21 DIAGNOSIS — I110 Hypertensive heart disease with heart failure: Secondary | ICD-10-CM | POA: Diagnosis not present

## 2024-03-21 DIAGNOSIS — I252 Old myocardial infarction: Secondary | ICD-10-CM

## 2024-03-21 DIAGNOSIS — G9529 Other cord compression: Principal | ICD-10-CM

## 2024-03-21 DIAGNOSIS — T40605A Adverse effect of unspecified narcotics, initial encounter: Secondary | ICD-10-CM | POA: Diagnosis not present

## 2024-03-21 DIAGNOSIS — Z8679 Personal history of other diseases of the circulatory system: Secondary | ICD-10-CM

## 2024-03-21 DIAGNOSIS — R3911 Hesitancy of micturition: Secondary | ICD-10-CM | POA: Diagnosis not present

## 2024-03-21 DIAGNOSIS — Z79899 Other long term (current) drug therapy: Secondary | ICD-10-CM

## 2024-03-21 DIAGNOSIS — G9589 Other specified diseases of spinal cord: Secondary | ICD-10-CM | POA: Diagnosis not present

## 2024-03-21 DIAGNOSIS — Z9221 Personal history of antineoplastic chemotherapy: Secondary | ICD-10-CM | POA: Diagnosis not present

## 2024-03-21 DIAGNOSIS — Z9841 Cataract extraction status, right eye: Secondary | ICD-10-CM

## 2024-03-21 DIAGNOSIS — Z9842 Cataract extraction status, left eye: Secondary | ICD-10-CM

## 2024-03-21 DIAGNOSIS — K5903 Drug induced constipation: Secondary | ICD-10-CM | POA: Diagnosis present

## 2024-03-21 DIAGNOSIS — Z7982 Long term (current) use of aspirin: Secondary | ICD-10-CM

## 2024-03-21 LAB — CBC WITH DIFFERENTIAL/PLATELET
Abs Immature Granulocytes: 0.02 K/uL (ref 0.00–0.07)
Basophils Absolute: 0 K/uL (ref 0.0–0.1)
Basophils Relative: 0 %
Eosinophils Absolute: 0.2 K/uL (ref 0.0–0.5)
Eosinophils Relative: 2 %
HCT: 39.8 % (ref 36.0–46.0)
Hemoglobin: 13.1 g/dL (ref 12.0–15.0)
Immature Granulocytes: 0 %
Lymphocytes Relative: 20 %
Lymphs Abs: 1.7 K/uL (ref 0.7–4.0)
MCH: 31.2 pg (ref 26.0–34.0)
MCHC: 32.9 g/dL (ref 30.0–36.0)
MCV: 94.8 fL (ref 80.0–100.0)
Monocytes Absolute: 0.5 K/uL (ref 0.1–1.0)
Monocytes Relative: 6 %
Neutro Abs: 6.2 K/uL (ref 1.7–7.7)
Neutrophils Relative %: 72 %
Platelets: 339 K/uL (ref 150–400)
RBC: 4.2 MIL/uL (ref 3.87–5.11)
RDW: 13.5 % (ref 11.5–15.5)
WBC: 8.6 K/uL (ref 4.0–10.5)
nRBC: 0 % (ref 0.0–0.2)

## 2024-03-21 LAB — URINALYSIS, ROUTINE W REFLEX MICROSCOPIC
Bilirubin Urine: NEGATIVE
Glucose, UA: NEGATIVE mg/dL
Ketones, ur: NEGATIVE mg/dL
Leukocytes,Ua: NEGATIVE
Nitrite: NEGATIVE
Protein, ur: NEGATIVE mg/dL
Specific Gravity, Urine: 1.02 (ref 1.005–1.030)
pH: 5.5 (ref 5.0–8.0)

## 2024-03-21 LAB — URINALYSIS, MICROSCOPIC (REFLEX)

## 2024-03-21 LAB — BASIC METABOLIC PANEL WITH GFR
Anion gap: 12 (ref 5–15)
BUN: 11 mg/dL (ref 8–23)
CO2: 24 mmol/L (ref 22–32)
Calcium: 9.8 mg/dL (ref 8.9–10.3)
Chloride: 104 mmol/L (ref 98–111)
Creatinine, Ser: 0.63 mg/dL (ref 0.44–1.00)
GFR, Estimated: >60 mL/min (ref >60)
Glucose, Bld: 107 mg/dL — ABNORMAL HIGH (ref 70–99)
Potassium: 3.9 mmol/L (ref 3.5–5.1)
Sodium: 140 mmol/L (ref 135–145)

## 2024-03-21 MED ORDER — LACTATED RINGERS IV SOLN: INTRAVENOUS | Status: AC

## 2024-03-21 MED ORDER — ENOXAPARIN SODIUM 40 MG/0.4ML IJ SOSY
40.0000 mg | PREFILLED_SYRINGE | INTRAMUSCULAR | Status: DC
  Administered 2024-03-21 – 2024-03-22 (×2): 40 mg via SUBCUTANEOUS
  Filled 2024-03-21 (×2): qty 0.4

## 2024-03-21 MED ORDER — GADOBUTROL 1 MMOL/ML IV SOLN
8.0000 mL | Freq: Once | INTRAVENOUS | Status: AC | PRN
  Administered 2024-03-21: 8 mL via INTRAVENOUS

## 2024-03-21 MED ORDER — MORPHINE SULFATE (PF) 2 MG/ML IV SOLN
4.0000 mg | INTRAVENOUS | Status: DC | PRN
  Administered 2024-03-21 – 2024-03-25 (×11): 4 mg via INTRAVENOUS
  Filled 2024-03-21 (×12): qty 2

## 2024-03-21 MED ORDER — SODIUM CHLORIDE 0.9 % IV BOLUS
1000.0000 mL | Freq: Once | INTRAVENOUS | Status: AC
Start: 2024-03-21 — End: 2024-03-21
  Administered 2024-03-21: 1000 mL via INTRAVENOUS

## 2024-03-21 MED ORDER — ONDANSETRON HCL 4 MG/2ML IJ SOLN
4.0000 mg | Freq: Four times a day (QID) | INTRAMUSCULAR | Status: DC | PRN
  Administered 2024-03-21 – 2024-03-25 (×6): 4 mg via INTRAVENOUS
  Filled 2024-03-21 (×6): qty 2

## 2024-03-21 MED ORDER — MORPHINE SULFATE (PF) 4 MG/ML IV SOLN
4.0000 mg | INTRAVENOUS | Status: DC | PRN
  Administered 2024-03-21: 4 mg via INTRAVENOUS
  Filled 2024-03-21: qty 1

## 2024-03-21 MED ORDER — ONDANSETRON HCL 4 MG PO TABS
4.0000 mg | ORAL_TABLET | Freq: Four times a day (QID) | ORAL | Status: DC | PRN
  Filled 2024-03-21: qty 1

## 2024-03-21 MED ORDER — MORPHINE SULFATE (PF) 4 MG/ML IV SOLN
4.0000 mg | Freq: Once | INTRAVENOUS | Status: AC
  Administered 2024-03-21: 4 mg via INTRAVENOUS
  Filled 2024-03-21: qty 1

## 2024-03-21 MED ORDER — HYDROMORPHONE HCL 1 MG/ML IJ SOLN
0.5000 mg | INTRAMUSCULAR | Status: DC | PRN
  Administered 2024-03-21 – 2024-03-22 (×2): 0.5 mg via INTRAVENOUS
  Filled 2024-03-21 (×3): qty 0.5

## 2024-03-21 MED ORDER — ONDANSETRON HCL 4 MG/2ML IJ SOLN
4.0000 mg | Freq: Once | INTRAMUSCULAR | Status: AC
  Administered 2024-03-21: 4 mg via INTRAVENOUS
  Filled 2024-03-21: qty 2

## 2024-03-21 NOTE — ED Triage Notes (Signed)
 Back spasms started yesterday states was dif iculty getting out of  bed

## 2024-03-21 NOTE — ED Provider Notes (Signed)
 [facility] Provider Note   CSN: [id number] Arrival date & time: 03/21/24  1151     Patient presents with: Back Pain   Lynn Hogan is a 78 y.o. female.  78 year old female presents ED with complaints of back pain since yesterday.  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  Patient reports since yesterday.  Patient reports she bent over to feed her dog and shortly after the spasm began.  She says she started to feel little bit better last night after taking Tylenol  but woke up this morning and was not able to get out of bed due to back spasm.  Her friend helped her get out of bed into the hospital today.  Patient reports that she had a CT scan which they advised for her to follow-up to get an MRI due to concerning findings and it was never completed outpatient.  Patient reports she was afraid she would not be able to lay flat on the machine.     Prior to Admission medications   Medication Sig Start Date End Date Taking? Authorizing Provider  acetaminophen  (TYLENOL ) 325 MG tablet Take 325 mg by mouth every 6 (six) hours as needed.    [provider]  aspirin  EC 81 MG EC tablet Take 1 tablet (81 mg total) by mouth daily. 08/03/18   Bhagat, Aleene, PA  atorvastatin  (LIPITOR ) 40 MG tablet Take 40 mg by mouth daily. Take 1 Tablet Daily    [provider]  atorvastatin  (LIPITOR ) 40 MG tablet Take 1 tablet (40 mg total) by mouth daily. 08/09/21   Burnard Debby LABOR, MD  atorvastatin  (LIPITOR ) 40 MG tablet Take 1 tablet (40 mg total) by mouth daily. 12/18/22   Burnard Debby LABOR, MD  atorvastatin  (LIPITOR ) 40 MG tablet TAKE 1 TABLET EVERY DAY (NEED MD APPOINTMENT) 03/03/23   Burnard Debby LABOR, MD  Cholecalciferol (VITAMIN D3) 10 MCG (400 UNIT) tablet Take 400 Units by mouth daily.    [provider]  diclofenac  Sodium (VOLTAREN ) 1 % GEL Apply 4 g topically 4 (four) times daily. 01/13/24   Emil Share,  DO  ezetimibe  (ZETIA ) 10 MG tablet TAKE 1 TABLET EVERY DAY (NEED MD APPOINTMENT FOR REFILLS) 12/25/23   Burnard Debby LABOR, MD  metoprolol  succinate (TOPROL -XL) 50 MG 24 hr tablet TAKE 1 TABLET EVERY DAY WITH OR IMMEDIATELY FOLLOWING A MEAL. Please call [phone] to schedule an overdue appointment for future refills. Thank you. 08/12/23   Burnard Debby LABOR, MD  morphine  (MSIR) 15 MG tablet Take 0.5 tablets (7.5 mg total) by mouth every 4 (four) hours as needed for severe pain (pain score 7-10). 01/13/24   Emil Share, DO  nitroGLYCERIN  (NITROSTAT ) 0.4 MG SL tablet Place 1 tablet (0.4 mg total) under the tongue every 5 (five) minutes as needed for chest pain. 05/21/20 07/18/21  Meng, Hao, PA  ondansetron  (ZOFRAN -ODT) 4 MG disintegrating tablet 4mg  ODT q4 hours prn nausea/vomit 01/13/24   Floyd, Dan, DO  phenazopyridine  (PYRIDIUM ) 100 MG tablet Take 1 tablet (100 mg total) by mouth 3 (three) times daily as needed for pain. 12/31/23   Mecum, Erin E, PA-C    Allergies: Ibuprofen and Nsaids    Review of Systems  Musculoskeletal:  Positive for back pain.  All other systems reviewed and are negative.   Updated Vital Signs BP (!) 182/83 (BP Location: Right Arm)   Pulse 81   Temp 98.3 F (36.8 C) (Oral)   Resp 15  Ht 5' 1 (1.549 m)   Wt 80.7 kg   SpO2 94%   BMI 33.63 kg/m   Physical Exam Vitals and nursing note reviewed.  Constitutional:      Appearance: Normal appearance.  HENT:     Head: Normocephalic and atraumatic.     Nose: Nose normal.  Eyes:     Extraocular Movements: Extraocular movements intact.     Conjunctiva/sclera: Conjunctivae normal.     Pupils: Pupils are equal, round, and reactive to light.  Cardiovascular:     Rate and Rhythm: Normal rate.  Pulmonary:     Effort: Pulmonary effort is normal. No respiratory distress.     Breath sounds: Normal breath sounds.  Musculoskeletal:        General: Tenderness present. Normal range of motion.     Cervical back: Normal range of  motion. No rigidity.  Skin:    General: Skin is warm.  Neurological:     General: No focal deficit present.     Mental Status: She is alert.  Psychiatric:        Mood and Affect: Mood normal.        Behavior: Behavior normal.     (all labs ordered are listed, but only abnormal results are displayed) Labs Reviewed  BASIC METABOLIC PANEL WITH GFR - Abnormal; Notable for the following components:      Result Value   Glucose, Bld 107 (*)    All other components within normal limits  URINALYSIS, ROUTINE W REFLEX MICROSCOPIC - Abnormal; Notable for the following components:   Hgb urine dipstick SMALL (*)    All other components within normal limits  URINALYSIS, MICROSCOPIC (REFLEX) - Abnormal; Notable for the following components:   Bacteria, UA RARE (*)    All other components within normal limits  CBC WITH DIFFERENTIAL/PLATELET    EKG: EKG Interpretation Date/Time:  Monday March 21 2024 12:23:00 EDT Ventricular Rate:  71 PR Interval:  175 QRS Duration:  89 QT Interval:  414 QTC Calculation: 450 R Axis:   -45  Text Interpretation: Sinus rhythm Probable left atrial enlargement Left anterior fascicular block Abnormal R-wave progression, late transition ST changes similar to 2020 tracing No STEMI Confirmed by Darra Chew [phone]) on 03/21/2024 12:31:15 PM  Radiology: CT Thoracic Spine Wo Contrast Result Date: 03/21/2024 CLINICAL DATA:  History of chronic back pain. Patient bent over to pick something up and felt a pop. Now with extreme mid back pain. 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 EXAM: CT THORACIC SPINE WITHOUT CONTRAST TECHNIQUE: Multidetector CT images of the thoracic were obtained using the standard protocol without intravenous contrast. RADIATION DOSE REDUCTION: This exam was performed according to the departmental dose-optimization program which includes automated exposure control, adjustment of the mA and/or kV according to patient  size and/or use of iterative reconstruction technique. COMPARISON:  Abdominopelvic CT 01/13/2024.  PET-CT 11/03/2022. FINDINGS: Alignment: Minimal convex right scoliosis. No significant focal angulation or listhesis. Vertebrae: Tracer-avid metastatic disease was demonstrated within the T9 and T10 vertebral bodies on previous PET-CT. The disease within the T9 vertebral body has significantly progressed in the interval with a very large lytic lesion nearly replacing the vertebral body, measuring up to 4.2 x 3.7 cm transverse on axial image 102/9. There is significant posterior extension of tumor into the spinal canal, expected to cause compression of the thoracic cord at this level. Mildly progressive tumor involving the superior endplate of T10. Associated significant soft tissue foraminal narrowing bilaterally at T9-10, worse  on the right. No other definite lesions or fractures identified. Paraspinal and other soft tissues: As above, paraspinal tumor at T9 associated with lytic osseous metastatic disease and extending into the T9-10 foramina bilaterally. No definite paraspinal hematoma or other acute paraspinal findings. Aortic and coronary artery atherosclerosis noted. Pulmonary assessment limited by breathing artifact. There is perihilar atelectasis bilaterally. Grossly stable small left lung nodules. Disc levels: Mild spondylosis with mild endplate osteophyte formation. No large disc herniation identified. As above, significant spinal stenosis due to tumor at T9 and T9-10. IMPRESSION: 1. Significant progression of lytic osseous metastatic disease within the T9 vertebral body with significant posterior extension of tumor into the spinal canal, expected to cause compression of the thoracic cord at this level. Recommend spine surgical consultation and further evaluation with MRI of the thoracic spine without and with contrast. 2. Mildly progressive tumor involving the superior endplate of T10. 3. No other definite  osseous metastases identified. 4.  Aortic Atherosclerosis (ICD10-I70.0). 5. Critical Value/emergent results were called by telephone at the time of interpretation on 03/21/2024 at 2:24 pm to provider Jessee Newnam LONG, who verbally acknowledged these results. Electronically Signed   By: Elsie Perone M.D.   On: 03/21/2024 14:25    Procedures   Medications Ordered in the ED  morphine  (PF) 4 MG/ML injection 4 mg (has no administration in time range)  sodium chloride  0.9 % bolus 1,000 mL (1,000 mLs Intravenous New Bag/Given 03/21/24 1416)  morphine  (PF) 4 MG/ML injection 4 mg (4 mg Intravenous Given 03/21/24 1341)  ondansetron  (ZOFRAN ) injection 4 mg (4 mg Intravenous Given 03/21/24 1341)    78 y.o. female presents to the ED with complaints of upper left leg pain, this involves an extensive number of treatment options, and is a complaint that carries with it a high risk of complications and morbidity.  The differential diagnosis includes muscle spasm, (Ddx)  On arrival pt is nontoxic, vitals significant for hypertension. Exam significant for left thoracic pain to palpation  Additional history obtained from chart review significant for PET scan by radiology, and management of malignant melanoma.    I ordered medication Zofran  and morphine  for pain management nausea  Lab Tests:  I Ordered, reviewed, and interpreted labs, which included: BMP, CBC, UA with no significant findings  Imaging Studies ordered:  I ordered imaging studies which included CT thoracic spine without contrast, I independently visualized and interpreted imaging which showed progression of a lytic osseous metastatic disease within the T9 vertebral body with extension of the tumor into the spinal canal.  ED Course:   Patient sitting stiffly in ED bed with complaints of acute on chronic back spasm x2 days.  Patient reports it hurts when she moves or lays flat.  Patient advises typically her pain is in the lumbar region on the flanks  but today is in her thoracic region.  Patient does not have any tenderness to palpation over bony prominences of vertebrae.  Patient has pain to palpation laterally in her thoracic region.  Patient denies any incontinence and there has been no focal deficits or neurologic deficits noted.  Patient denies numbness and tingling in her distal extremities.  No saddle paresthesias no neurodeficits on exam.  Patient does have significant history of melanoma which she advises has been taking care of.  Patient was seen at [facility] for oncology care.  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  After discussion with attending it was advised to start with thoracic CT without  contrast to rule out any compression fracture.  CT came back positive for progression of tumor into the spinal canal which is expected to cause compression at the thoracic cord.  It was recommended to get further imaging with MRI.  Neurosurgery was consulted for further recommendation. Dr. Kien Mirsky with neurosurgery recommended to be admitted to medicine for pain management and MRI.  He would want to be consulted with results at that point for further treatment recommendation.  Dr. Vann was consulted for admission.  It was agreed patient to be admitted for pain management and MRI then consult for neurosurgery at that point.  Patient care was transferred to Dr. Vann awaiting transfer to [facility].     Portions of this note were generated with Scientist, clinical (histocompatibility and immunogenetics). Dictation errors may occur despite best attempts at proofreading.   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

## 2024-03-21 NOTE — ED Notes (Signed)
 Pt. To go to CT Scanner then will hang fluids

## 2024-03-21 NOTE — ED Notes (Signed)
 Room ready at [facility], awaiting [facility] for transport.

## 2024-03-21 NOTE — H&P (Signed)
 History and Physical    Patient: Lynn Hogan FMW:[id number] DOB: May 25, 1946 DOA: 03/21/2024 DOS: the patient was seen and examined on 03/21/2024 PCP: Joshua Franky Sharper, PA-C  Patient coming from: Home  Chief Complaint:  Chief Complaint  Patient presents with   Back Pain   HPI: Lynn Hogan is a 78 y.o. female with medical history significant of malignant melanoma of the left forearm status post chemotherapy and surgery years ago, coronary artery disease, hyperlipidemia, essential hypertension, ischemic cardiomyopathy, degenerative disc disease who presented to the ER at [facility] with back spasms and pain.  Patient has daughter reported that patient has progressively getting weak.  She used to walk with a walking stick then moved to a walker and now unable to even get up and walk.  She also complained of significant pain in her back since yesterday.  She bent over to feed her dog and shortly after the spasms started.  She took Tylenol  last night felt a little better but not this morning.  One of her friends help her out of bed and brought her to the hospital.  Patient was apparently reviewed that well but with a CT scan that showed findings concerning for lesion in her back and was asked to do an MRI but did not get that done.  She was afraid she was not going to be able to lay flat on the table.  Today however CT scan in the ER suggested significant progression of lytic lesion considered to be metastatic disease around T9 vertebral body with significant posterior extension of tumor into the spinal column.  Spine surgery consultation was recommended.  Patient admitted here from [facility] so she can be evaluated by spine surgery.  Still has some pain and weakness.  Review of Systems: As mentioned in the history of present illness. All other systems reviewed and are negative. Past Medical History:  Diagnosis Date   Coronary artery disease    Hyperlipidemia    NSTEMI  (non-ST elevated myocardial infarction) (HCC)    08/02/2018-PCI/DES to RCA    STEMI (ST elevation myocardial infarction) (HCC)    08/08/2018-PCI/DES to mLAD, 85% osital stenosis of 1st diag.    Systolic heart failure [facility])    Past Surgical History:  Procedure Laterality Date   CARDIAC CATHETERIZATION     CATARACT EXTRACTION, BILATERAL  02/12/2016   CORONARY STENT INTERVENTION  08/02/2018   CORONARY STENT INTERVENTION N/A 08/02/2018   Procedure: CORONARY STENT INTERVENTION;  Surgeon: Mady Bruckner, MD;  Location: [facility] INVASIVE CV LAB;  Service: Cardiovascular;  Laterality: N/A;   CORONARY/GRAFT ACUTE MI REVASCULARIZATION N/A 08/08/2018   Procedure: Coronary/Graft Acute MI Revascularization;  Surgeon: Burnard Debby LABOR, MD;  Location: [facility] INVASIVE CV LAB;  Service: Cardiovascular;  Laterality: N/A;   HYSTERECTOMY ABDOMINAL WITH SALPINGECTOMY  02/12/1971   LEFT HEART CATH AND CORONARY ANGIOGRAPHY N/A 08/02/2018   Procedure: LEFT HEART CATH AND CORONARY ANGIOGRAPHY;  Surgeon: Mady Bruckner, MD;  Location: [facility] INVASIVE CV LAB;  Service: Cardiovascular;  Laterality: N/A;   LEFT HEART CATH AND CORONARY ANGIOGRAPHY N/A 08/08/2018   Procedure: LEFT HEART CATH AND CORONARY ANGIOGRAPHY;  Surgeon: Burnard Debby LABOR, MD;  Location: [facility] INVASIVE CV LAB;  Service: Cardiovascular;  Laterality: N/A;   Social History:  reports that she has never smoked. She has never used smokeless tobacco. She reports that she does not drink alcohol and does not use drugs.  Allergies  Allergen Reactions   Ibuprofen Palpitations   Nsaids Palpitations  Patient stated she has not tried all NSAIDS to see if they give her palpitations, but she is not willing to try anything else- just in case    Family History  Problem Relation Age of Onset   Microcephaly Mother    Brain cancer Mother    Lung cancer Mother    Heart attack Father 23    Prior to Admission medications   Medication Sig Start Date End Date Taking? Authorizing  Provider  acetaminophen  (TYLENOL ) 325 MG tablet Take 325 mg by mouth every 6 (six) hours as needed.    [provider]  aspirin  EC 81 MG EC tablet Take 1 tablet (81 mg total) by mouth daily. 08/03/18   Bhagat, Aleene, PA  atorvastatin  (LIPITOR ) 40 MG tablet Take 40 mg by mouth daily. Take 1 Tablet Daily    [provider]  atorvastatin  (LIPITOR ) 40 MG tablet Take 1 tablet (40 mg total) by mouth daily. 08/09/21   Burnard Debby LABOR, MD  atorvastatin  (LIPITOR ) 40 MG tablet Take 1 tablet (40 mg total) by mouth daily. 12/18/22   Burnard Debby LABOR, MD  atorvastatin  (LIPITOR ) 40 MG tablet TAKE 1 TABLET EVERY DAY (NEED MD APPOINTMENT) 03/03/23   Burnard Debby LABOR, MD  Cholecalciferol (VITAMIN D3) 10 MCG (400 UNIT) tablet Take 400 Units by mouth daily.    [provider]  diclofenac  Sodium (VOLTAREN ) 1 % GEL Apply 4 g topically 4 (four) times daily. 01/13/24   Emil Share, DO  ezetimibe  (ZETIA ) 10 MG tablet TAKE 1 TABLET EVERY DAY (NEED MD APPOINTMENT FOR REFILLS) 12/25/23   Burnard Debby LABOR, MD  metoprolol  succinate (TOPROL -XL) 50 MG 24 hr tablet TAKE 1 TABLET EVERY DAY WITH OR IMMEDIATELY FOLLOWING A MEAL. Please call [phone] to schedule an overdue appointment for future refills. Thank you. 08/12/23   Burnard Debby LABOR, MD  morphine  (MSIR) 15 MG tablet Take 0.5 tablets (7.5 mg total) by mouth every 4 (four) hours as needed for severe pain (pain score 7-10). 01/13/24   Emil Share, DO  nitroGLYCERIN  (NITROSTAT ) 0.4 MG SL tablet Place 1 tablet (0.4 mg total) under the tongue every 5 (five) minutes as needed for chest pain. 05/21/20 07/18/21  Meng, Hao, PA  ondansetron  (ZOFRAN -ODT) 4 MG disintegrating tablet 4mg  ODT q4 hours prn nausea/vomit 01/13/24   Floyd, Dan, DO  phenazopyridine  (PYRIDIUM ) 100 MG tablet Take 1 tablet (100 mg total) by mouth 3 (three) times daily as needed for pain. 12/31/23   MecumRocky BRAVO, PA-C    Physical Exam: Vitals:   03/21/24 1347 03/21/24 1430 03/21/24 1602 03/21/24  1806  BP: (!) 178/72 (!) 145/68 (!) 182/83 (!) 163/69  Pulse: 73 73 81 73  Resp: 13 15 15 16   Temp:  98 F (36.7 C) 98.3 F (36.8 C) 98.5 F (36.9 C)  TempSrc:   Oral   SpO2: 97% 91% 94% 94%  Weight:      Height:       Constitutional: Chronically ill looking, NAD, calm, comfortable Eyes: PERRL, lids and conjunctivae normal ENMT: Mucous membranes are moist. Posterior pharynx clear of any exudate or lesions.Normal dentition.  Neck: normal, supple, no masses, no thyromegaly Respiratory: clear to auscultation bilaterally, no wheezing, no crackles. Normal respiratory effort. No accessory muscle use.  Cardiovascular: Regular rate and rhythm, no murmurs / rubs / gallops. No extremity edema. 2+ pedal pulses. No carotid bruits.  Abdomen: no tenderness, no masses palpated. No hepatosplenomegaly. Bowel sounds positive.  Musculoskeletal: Good range of motion, no joint  swelling but has spine tenderness and positive SLR skin: no rashes, lesions, ulcers. No induration Neurologic: CN 2-12 grossly intact. Sensation intact, DTR normal. Strength 5/5 in all 4.  Psychiatric: Normal judgment and insight. Alert and oriented x 3. Normal mood  Data Reviewed:  Temperature 98.5, blood pressure 205/88, CBC and chemistry appear to be within normal.  Urinalysis essentially normal.  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  This is expected to cause compression of the thoracic cord at that level.  Recommendation for spine surgical consult.  Mildly progressive tumor involving the superior endplate of T10 also  Assessment and Plan:  #1 bone lesion and mass: Suspected : metastatic disease with mass effect on the cord.  Patient will be admitted.  MRI is pending.  Surgery already consulted and Dr. Joshua of neurosurgery recommended MRI and they will follow.  Continue pain  control.  Bedrest.  PT and OT in the morning.  Will follow MRI results  #2 history of malignant melanoma: Has gone through treatment including surgery and immunotherapy.  If lesion is confirmed to be malignant melanoma this could be recurrence.  May require oncology reconsult.  #3 essential hypertension: Confirm on resume home regimen.  #4 coronary artery disease: Stable no decompensation  #5 hyperlipidemia: Continue with statin    Advance Care Planning:   Code Status: Full Code   Consults: Dr. Joshua, neurosurgery.  Family Communication: Daughter at bedside  Severity of Illness: The appropriate patient status for this patient is INPATIENT. Inpatient status is judged to be reasonable and necessary in order to provide the required intensity of service to ensure the patient's safety. The patient's presenting symptoms, physical exam findings, and initial radiographic and laboratory data in the context of their chronic comorbidities is felt to place them at high risk for further clinical deterioration. Furthermore, it is not anticipated that the patient will be medically stable for discharge from the hospital within 2 midnights of admission.   * I certify that at the point of admission it is my clinical judgment that the patient will require inpatient hospital care spanning beyond 2 midnights from the point of admission due to high intensity of service, high risk for further deterioration and high frequency of surveillance required.*  AuthorBETHA SIM KNOLL, MD 03/21/2024 7:36 PM  For on call review [email or web address].

## 2024-03-21 NOTE — ED Notes (Signed)
 Pt. Has complaints of back pain with long with complaints of long term back pain.  Pt. Reports she has had back pain for years.  No leg pain at this time.  Pt. In no distress.

## 2024-03-21 NOTE — ED Notes (Signed)
Urine specimen in lab 

## 2024-03-21 NOTE — Progress Notes (Signed)
 Off the floor for MRI.

## 2024-03-22 ENCOUNTER — Inpatient Hospital Stay (HOSPITAL_COMMUNITY)

## 2024-03-22 ENCOUNTER — Encounter (HOSPITAL_COMMUNITY): Payer: Self-pay | Admitting: Internal Medicine

## 2024-03-22 DIAGNOSIS — I10 Essential (primary) hypertension: Secondary | ICD-10-CM | POA: Diagnosis not present

## 2024-03-22 DIAGNOSIS — G9589 Other specified diseases of spinal cord: Secondary | ICD-10-CM | POA: Diagnosis present

## 2024-03-22 DIAGNOSIS — R7303 Prediabetes: Secondary | ICD-10-CM | POA: Diagnosis present

## 2024-03-22 DIAGNOSIS — Z981 Arthrodesis status: Secondary | ICD-10-CM | POA: Diagnosis not present

## 2024-03-22 DIAGNOSIS — I5022 Chronic systolic (congestive) heart failure: Secondary | ICD-10-CM | POA: Diagnosis present

## 2024-03-22 DIAGNOSIS — G992 Myelopathy in diseases classified elsewhere: Secondary | ICD-10-CM | POA: Diagnosis present

## 2024-03-22 DIAGNOSIS — Z9079 Acquired absence of other genital organ(s): Secondary | ICD-10-CM | POA: Diagnosis not present

## 2024-03-22 DIAGNOSIS — Z85820 Personal history of malignant melanoma of skin: Secondary | ICD-10-CM | POA: Diagnosis not present

## 2024-03-22 DIAGNOSIS — Z8679 Personal history of other diseases of the circulatory system: Secondary | ICD-10-CM | POA: Diagnosis not present

## 2024-03-22 DIAGNOSIS — Y92239 Unspecified place in hospital as the place of occurrence of the external cause: Secondary | ICD-10-CM | POA: Diagnosis not present

## 2024-03-22 DIAGNOSIS — Z955 Presence of coronary angioplasty implant and graft: Secondary | ICD-10-CM | POA: Diagnosis not present

## 2024-03-22 DIAGNOSIS — C4362 Malignant melanoma of left upper limb, including shoulder: Secondary | ICD-10-CM | POA: Diagnosis not present

## 2024-03-22 DIAGNOSIS — Z9842 Cataract extraction status, left eye: Secondary | ICD-10-CM | POA: Diagnosis not present

## 2024-03-22 DIAGNOSIS — E785 Hyperlipidemia, unspecified: Secondary | ICD-10-CM | POA: Diagnosis present

## 2024-03-22 DIAGNOSIS — C8330 Diffuse large B-cell lymphoma, unspecified site: Secondary | ICD-10-CM | POA: Diagnosis not present

## 2024-03-22 DIAGNOSIS — D492 Neoplasm of unspecified behavior of bone, soft tissue, and skin: Secondary | ICD-10-CM | POA: Diagnosis not present

## 2024-03-22 DIAGNOSIS — G893 Neoplasm related pain (acute) (chronic): Secondary | ICD-10-CM | POA: Diagnosis present

## 2024-03-22 DIAGNOSIS — M4804 Spinal stenosis, thoracic region: Secondary | ICD-10-CM | POA: Diagnosis present

## 2024-03-22 DIAGNOSIS — Z79899 Other long term (current) drug therapy: Secondary | ICD-10-CM | POA: Diagnosis not present

## 2024-03-22 DIAGNOSIS — Z8249 Family history of ischemic heart disease and other diseases of the circulatory system: Secondary | ICD-10-CM | POA: Diagnosis not present

## 2024-03-22 DIAGNOSIS — I252 Old myocardial infarction: Secondary | ICD-10-CM | POA: Diagnosis not present

## 2024-03-22 DIAGNOSIS — I255 Ischemic cardiomyopathy: Secondary | ICD-10-CM | POA: Diagnosis present

## 2024-03-22 DIAGNOSIS — C7951 Secondary malignant neoplasm of bone: Secondary | ICD-10-CM | POA: Diagnosis present

## 2024-03-22 DIAGNOSIS — Z888 Allergy status to other drugs, medicaments and biological substances status: Secondary | ICD-10-CM | POA: Diagnosis not present

## 2024-03-22 DIAGNOSIS — M8448XA Pathological fracture, other site, initial encounter for fracture: Secondary | ICD-10-CM | POA: Diagnosis present

## 2024-03-22 DIAGNOSIS — M549 Dorsalgia, unspecified: Secondary | ICD-10-CM | POA: Diagnosis not present

## 2024-03-22 DIAGNOSIS — Z9841 Cataract extraction status, right eye: Secondary | ICD-10-CM | POA: Diagnosis not present

## 2024-03-22 DIAGNOSIS — C8510 Unspecified B-cell lymphoma, unspecified site: Secondary | ICD-10-CM | POA: Diagnosis not present

## 2024-03-22 DIAGNOSIS — Z90710 Acquired absence of both cervix and uterus: Secondary | ICD-10-CM | POA: Diagnosis not present

## 2024-03-22 DIAGNOSIS — C8332 Diffuse large B-cell lymphoma, intrathoracic lymph nodes: Secondary | ICD-10-CM | POA: Diagnosis present

## 2024-03-22 DIAGNOSIS — Y93K1 Activity, walking an animal: Secondary | ICD-10-CM | POA: Diagnosis not present

## 2024-03-22 DIAGNOSIS — I2510 Atherosclerotic heart disease of native coronary artery without angina pectoris: Secondary | ICD-10-CM | POA: Diagnosis present

## 2024-03-22 DIAGNOSIS — I110 Hypertensive heart disease with heart failure: Secondary | ICD-10-CM | POA: Diagnosis present

## 2024-03-22 DIAGNOSIS — M899 Disorder of bone, unspecified: Secondary | ICD-10-CM | POA: Diagnosis not present

## 2024-03-22 DIAGNOSIS — Z9221 Personal history of antineoplastic chemotherapy: Secondary | ICD-10-CM | POA: Diagnosis not present

## 2024-03-22 DIAGNOSIS — R001 Bradycardia, unspecified: Secondary | ICD-10-CM | POA: Diagnosis present

## 2024-03-22 LAB — COMPREHENSIVE METABOLIC PANEL WITH GFR
ALT: 14 U/L (ref 0–44)
AST: 22 U/L (ref 15–41)
Albumin: 3.2 g/dL — ABNORMAL LOW (ref 3.5–5.0)
Alkaline Phosphatase: 53 U/L (ref 38–126)
Anion gap: 9 (ref 5–15)
BUN: 10 mg/dL (ref 8–23)
CO2: 25 mmol/L (ref 22–32)
Calcium: 9.1 mg/dL (ref 8.9–10.3)
Chloride: 104 mmol/L (ref 98–111)
Creatinine, Ser: 0.72 mg/dL (ref 0.44–1.00)
GFR, Estimated: >60 mL/min (ref >60)
Glucose, Bld: 122 mg/dL — ABNORMAL HIGH (ref 70–99)
Potassium: 4 mmol/L (ref 3.5–5.1)
Sodium: 138 mmol/L (ref 135–145)
Total Bilirubin: 0.5 mg/dL (ref 0.0–1.2)
Total Protein: 6.4 g/dL — ABNORMAL LOW (ref 6.5–8.1)

## 2024-03-22 LAB — CBC
HCT: 37.2 % (ref 36.0–46.0)
Hemoglobin: 12.1 g/dL (ref 12.0–15.0)
MCH: 31.3 pg (ref 26.0–34.0)
MCHC: 32.5 g/dL (ref 30.0–36.0)
MCV: 96.4 fL (ref 80.0–100.0)
Platelets: 302 K/uL (ref 150–400)
RBC: 3.86 MIL/uL — ABNORMAL LOW (ref 3.87–5.11)
RDW: 14 % (ref 11.5–15.5)
WBC: 7.5 K/uL (ref 4.0–10.5)
nRBC: 0 % (ref 0.0–0.2)

## 2024-03-22 MED ORDER — METHOCARBAMOL 1000 MG/10ML IJ SOLN
500.0000 mg | Freq: Three times a day (TID) | INTRAMUSCULAR | Status: DC
  Administered 2024-03-22 – 2024-03-27 (×11): 500 mg via INTRAVENOUS
  Filled 2024-03-22 (×13): qty 10

## 2024-03-22 MED ORDER — METOPROLOL SUCCINATE ER 50 MG PO TB24
50.0000 mg | ORAL_TABLET | Freq: Every evening | ORAL | Status: DC
  Administered 2024-03-22 – 2024-03-31 (×9): 50 mg via ORAL
  Filled 2024-03-22 (×9): qty 1

## 2024-03-22 MED ORDER — ACETAMINOPHEN 500 MG PO TABS
1000.0000 mg | ORAL_TABLET | Freq: Three times a day (TID) | ORAL | Status: DC
  Administered 2024-03-22 – 2024-03-25 (×9): 1000 mg via ORAL
  Filled 2024-03-22 (×9): qty 2

## 2024-03-22 MED ORDER — EZETIMIBE 10 MG PO TABS
10.0000 mg | ORAL_TABLET | Freq: Every day | ORAL | Status: DC
  Administered 2024-03-22 – 2024-04-09 (×18): 10 mg via ORAL
  Filled 2024-03-22 (×19): qty 1

## 2024-03-22 MED ORDER — IOHEXOL 9 MG/ML PO SOLN
500.0000 mL | ORAL | Status: AC
  Administered 2024-03-22: 500 mL via ORAL

## 2024-03-22 MED ORDER — ATORVASTATIN CALCIUM 40 MG PO TABS
40.0000 mg | ORAL_TABLET | Freq: Every evening | ORAL | Status: DC
  Administered 2024-03-22 – 2024-04-08 (×17): 40 mg via ORAL
  Filled 2024-03-22 (×17): qty 1

## 2024-03-22 MED ORDER — IOHEXOL 350 MG/ML SOLN
75.0000 mL | Freq: Once | INTRAVENOUS | Status: AC | PRN
  Administered 2024-03-22: 75 mL via INTRAVENOUS

## 2024-03-22 MED ORDER — GABAPENTIN 100 MG PO CAPS
100.0000 mg | ORAL_CAPSULE | Freq: Three times a day (TID) | ORAL | Status: DC
Start: 2024-03-22 — End: 2024-03-27
  Administered 2024-03-22 – 2024-03-23 (×2): 100 mg via ORAL
  Filled 2024-03-22 (×8): qty 1

## 2024-03-22 NOTE — Consult Note (Signed)
 Reason for Consult: Thoracic tumor Referring Physician: EDP  Lynn Hogan is an 78 y.o. female.   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  It is in the thoracic of lumbar region.  She has seen outside providers regarding this and had a CT renal stone study within the last few weeks.  Seems yesterday she started having more spasm like back pain and felt a pop in her back when she bent over while walking her dog.  He denies issues with her legs.  She denies weakness or numbness or tingling or bowel and bladder issues.  She is not having trouble walking.  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  She tells me she did not have prolonged oncology follow-up after her melanoma resection because the treatment recommended was denied by her insurance company.  Past Medical History:  Diagnosis Date   Coronary artery disease    Hyperlipidemia    NSTEMI (non-ST elevated myocardial infarction) (HCC)    08/02/2018-PCI/DES to RCA    STEMI (ST elevation myocardial infarction) (HCC)    08/08/2018-PCI/DES to mLAD, 85% osital stenosis of 1st diag.    Systolic heart failure [facility])     Past Surgical History:  Procedure Laterality Date   CARDIAC CATHETERIZATION     CATARACT EXTRACTION, BILATERAL  02/12/2016   CORONARY STENT INTERVENTION  08/02/2018   CORONARY STENT INTERVENTION N/A 08/02/2018   Procedure: CORONARY STENT INTERVENTION;  Surgeon: Mady Bruckner, MD;  Location: [facility];  Service: Cardiovascular;  Laterality: N/A;   CORONARY/GRAFT ACUTE MI REVASCULARIZATION N/A 08/08/2018   Procedure: Coronary/Graft Acute MI Revascularization;  Surgeon: Burnard Debby LABOR, MD;  Location: [facility];  Service: Cardiovascular;  Laterality: N/A;   HYSTERECTOMY ABDOMINAL WITH  SALPINGECTOMY  02/12/1971   LEFT HEART CATH AND CORONARY ANGIOGRAPHY N/A 08/02/2018   Procedure: LEFT HEART CATH AND CORONARY ANGIOGRAPHY;  Surgeon: Mady Bruckner, MD;  Location: [facility];  Service: Cardiovascular;  Laterality: N/A;   LEFT HEART CATH AND CORONARY ANGIOGRAPHY N/A 08/08/2018   Procedure: LEFT HEART CATH AND CORONARY ANGIOGRAPHY;  Surgeon: Burnard Debby LABOR, MD;  Location: [facility];  Service: Cardiovascular;  Laterality: N/A;    Allergies  Allergen Reactions   Ibuprofen Palpitations   Nsaids Palpitations    Patient stated she has not tried all NSAIDS to see if they give her palpitations, but she is not willing to try anything else- just in case    Social History   Tobacco Use   Smoking status: Never   Smokeless tobacco: Never  Substance Use Topics   Alcohol use: Never    Family History  Problem Relation Age of Onset   Microcephaly Mother    Brain cancer Mother    Lung cancer Mother    Heart attack Father 19     Review of Systems  Positive ROS: neg  All other systems have been reviewed and were otherwise negative with the exception of those mentioned in the HPI and as above.  Objective: Vital signs in last 24 hours: Temp:  [97.7 F (36.5 C)-98.5 F (36.9 C)] 98.3 F (36.8 C) (09/23 0738) Pulse Rate:  [71-84] 84 (09/23 0738) Resp:  [13-19] 19 (09/23 0738) BP: (124-205)/(49-88) 167/68 (09/23 0738) SpO2:  [91 %-97 %] 95 % (  09/23 0738) Weight:  [80.7 kg] 80.7 kg (09/22 1158)  General Appearance: Alert, cooperative, no distress, appears stated age Head: Normocephalic, without obvious abnormality, atraumatic Eyes: PERRL, conjunctiva/corneas clear, EOM's intact     Throat: benign Neck: Supple, symmetrical, trachea midline Lungs:  respirations unlabored Heart: Regular rate and rhythm Abdomen: Soft, non-tender Extremities: Extremities normal, atraumatic, no cyanosis or edema Pulses: 2+ and symmetric all extremities Skin: Skin color,  texture, turgor normal, no rashes or lesions  NEUROLOGIC:   Mental status: A&O x4, no aphasia, good attention span, Memory and fund of knowledge appear to be appropriate Motor Exam - grossly normal, normal tone and bulk to an in bed exam Sensory Exam - grossly normal Reflexes: symmetric, no pathologic reflexes, No Hoffman's, No clonus Coordination - grossly normal Gait - not tested Balance -not tested Cranial Nerves: I: smell Not tested  II: visual acuity  OS: na  OD: na  II: visual fields Full to confrontation  II: pupils Equal, round, reactive to light  III,VII: ptosis None  III,IV,VI: extraocular muscles  Full ROM  V: mastication Normal  V: facial light touch sensation  Normal  V,VII: corneal reflex  Present  VII: facial muscle function - upper  Normal  VII: facial muscle function - lower Normal  VIII: hearing Not tested  IX: soft palate elevation  Normal  IX,X: gag reflex Present  XI: trapezius strength  5/5  XI: sternocleidomastoid strength 5/5  XI: neck flexion strength  5/5  XII: tongue strength  Normal    Data Review Lab Results  Component Value Date   WBC 7.5 03/22/2024   HGB 12.1 03/22/2024   HCT 37.2 03/22/2024   MCV 96.4 03/22/2024   PLT 302 03/22/2024   Lab Results  Component Value Date   NA 138 03/22/2024   K 4.0 03/22/2024   CL 104 03/22/2024   CO2 25 03/22/2024   BUN 10 03/22/2024   CREATININE 0.72 03/22/2024   GLUCOSE 122 (H) 03/22/2024   Lab Results  Component Value Date   INR 1.00 08/08/2018    Radiology: MR THORACIC SPINE W WO CONTRAST Result Date: 03/22/2024 CLINICAL DATA:  Metastatic disease evaluation. EXAM: MRI THORACIC AND LUMBAR SPINE WITHOUT AND WITH CONTRAST TECHNIQUE: Multiplanar and multiecho pulse sequences of the thoracic and lumbar spine were obtained without and with intravenous contrast. CONTRAST:  8mL GADAVIST  GADOBUTROL  1 MMOL/ML IV SOLN COMPARISON:  Same day CT of the thoracic spine. FINDINGS: MRI THORACIC SPINE FINDINGS  Alignment:  No substantial sagittal subluxation. Vertebrae: Bulky enhancing metastatic lesion at T9 with extraosseous extension posteriorly into the canal with resulting moderate to severe canal stenosis. Enhancing tumor also extends into the adjacent T10 vertebral body, right T9-T10 foramen as well as the right greater than left T9 posterior elements. A small focus of enhancement posteriorly at T8 is also compatible with a metastasis. Cord:  Normal cord signal. Paraspinal and other soft tissues: Extraosseous extension of tumor at T9 is described above but also extends into the right paraspinal soft tissues. Disc levels: At T9, moderate to severe canal stenosis due to extraosseous extension of tumor is detailed above. Tumor also extends into and effaces the T9-T10 foramina bilaterally, greater on the right. Otherwise, no significant stenosis in the thoracic spine. MRI LUMBAR SPINE FINDINGS Segmentation:  Standard. Alignment:  Grade 2 anterolisthesis L5 on S1. Vertebrae: No fracture, evidence of discitis, or suspicious bone lesion. Bilateral L5 pars defects. Conus medullaris: Extends to the L1-L2 level and appears normal. Paraspinal and other soft  tissues: Unremarkable. Disc levels: T12-L1: No significant disc protrusion, foraminal stenosis, or canal stenosis. L1-L2: No significant disc protrusion, foraminal stenosis, or canal stenosis. L2-L3: Disc bulging, ligamentum flavum thickening and mild facet arthropathy. No significant stenosis. L3-L4: Disc bulging, ligamentum flavum thickening facet arthropathy. No significant stenosis. L4-L5: Disc height loss. Disc bulging. Mild left foraminal stenosis. Patent canal and right foramen. L5-S1: Grade 2 anterolisthesis. Bilateral pars defects. Uncovering of the disc. Moderate bilateral foraminal stenosis. Patent canal. IMPRESSION: 1. Bulky osseous metastatic disease at T9 (as detailed above) with extension inferiorly to involve T10. Extraosseous extension into the canal,  paraspinal soft tissues, and T9-T10 foramina. Resulting moderate to severe canal stenosis at T9 and effaced foramina. 2. Additional small metastasis at T8. 3. At L5-S1, grade 2 anterolisthesis with bilateral pars defects and moderate bilateral foraminal stenosis. Electronically Signed   By: Gilmore GORMAN Molt M.D.   On: 03/22/2024 00:12   MR Lumbar Spine W Wo Contrast Result Date: 03/22/2024 CLINICAL DATA:  Metastatic disease evaluation. EXAM: MRI THORACIC AND LUMBAR SPINE WITHOUT AND WITH CONTRAST TECHNIQUE: Multiplanar and multiecho pulse sequences of the thoracic and lumbar spine were obtained without and with intravenous contrast. CONTRAST:  8mL GADAVIST  GADOBUTROL  1 MMOL/ML IV SOLN COMPARISON:  Same day CT of the thoracic spine. FINDINGS: MRI THORACIC SPINE FINDINGS Alignment:  No substantial sagittal subluxation. Vertebrae: Bulky enhancing metastatic lesion at T9 with extraosseous extension posteriorly into the canal with resulting moderate to severe canal stenosis. Enhancing tumor also extends into the adjacent T10 vertebral body, right T9-T10 foramen as well as the right greater than left T9 posterior elements. A small focus of enhancement posteriorly at T8 is also compatible with a metastasis. Cord:  Normal cord signal. Paraspinal and other soft tissues: Extraosseous extension of tumor at T9 is described above but also extends into the right paraspinal soft tissues. Disc levels: At T9, moderate to severe canal stenosis due to extraosseous extension of tumor is detailed above. Tumor also extends into and effaces the T9-T10 foramina bilaterally, greater on the right. Otherwise, no significant stenosis in the thoracic spine. MRI LUMBAR SPINE FINDINGS Segmentation:  Standard. Alignment:  Grade 2 anterolisthesis L5 on S1. Vertebrae: No fracture, evidence of discitis, or suspicious bone lesion. Bilateral L5 pars defects. Conus medullaris: Extends to the L1-L2 level and appears normal. Paraspinal and other soft  tissues: Unremarkable. Disc levels: T12-L1: No significant disc protrusion, foraminal stenosis, or canal stenosis. L1-L2: No significant disc protrusion, foraminal stenosis, or canal stenosis. L2-L3: Disc bulging, ligamentum flavum thickening and mild facet arthropathy. No significant stenosis. L3-L4: Disc bulging, ligamentum flavum thickening facet arthropathy. No significant stenosis. L4-L5: Disc height loss. Disc bulging. Mild left foraminal stenosis. Patent canal and right foramen. L5-S1: Grade 2 anterolisthesis. Bilateral pars defects. Uncovering of the disc. Moderate bilateral foraminal stenosis. Patent canal. IMPRESSION: 1. Bulky osseous metastatic disease at T9 (as detailed above) with extension inferiorly to involve T10. Extraosseous extension into the canal, paraspinal soft tissues, and T9-T10 foramina. Resulting moderate to severe canal stenosis at T9 and effaced foramina. 2. Additional small metastasis at T8. 3. At L5-S1, grade 2 anterolisthesis with bilateral pars defects and moderate bilateral foraminal stenosis. Electronically Signed   By: Gilmore GORMAN Molt M.D.   On: 03/22/2024 00:12   CT Thoracic Spine Wo Contrast Result Date: 03/21/2024 CLINICAL DATA:  History of chronic back pain. Patient bent over to pick something up and felt a pop. Now with extreme mid back pain. 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 EXAM:  CT THORACIC SPINE WITHOUT CONTRAST TECHNIQUE: Multidetector CT images of the thoracic were obtained using the standard protocol without intravenous contrast. RADIATION DOSE REDUCTION: This exam was performed according to the departmental dose-optimization program which includes automated exposure control, adjustment of the mA and/or kV according to patient size and/or use of iterative reconstruction technique. COMPARISON:  Abdominopelvic CT 01/13/2024.  PET-CT 11/03/2022. FINDINGS: Alignment: Minimal convex right scoliosis. No significant focal angulation  or listhesis. Vertebrae: Tracer-avid metastatic disease was demonstrated within the T9 and T10 vertebral bodies on previous PET-CT. The disease within the T9 vertebral body has significantly progressed in the interval with a very large lytic lesion nearly replacing the vertebral body, measuring up to 4.2 x 3.7 cm transverse on axial image 102/9. There is significant posterior extension of tumor into the spinal canal, expected to cause compression of the thoracic cord at this level. Mildly progressive tumor involving the superior endplate of T10. Associated significant soft tissue foraminal narrowing bilaterally at T9-10, worse on the right. No other definite lesions or fractures identified. Paraspinal and other soft tissues: As above, paraspinal tumor at T9 associated with lytic osseous metastatic disease and extending into the T9-10 foramina bilaterally. No definite paraspinal hematoma or other acute paraspinal findings. Aortic and coronary artery atherosclerosis noted. Pulmonary assessment limited by breathing artifact. There is perihilar atelectasis bilaterally. Grossly stable small left lung nodules. Disc levels: Mild spondylosis with mild endplate osteophyte formation. No large disc herniation identified. As above, significant spinal stenosis due to tumor at T9 and T9-10. IMPRESSION: 1. Significant progression of lytic osseous metastatic disease within the T9 vertebral body with significant posterior extension of tumor into the spinal canal, expected to cause compression of the thoracic cord at this level. Recommend spine surgical consultation and further evaluation with MRI of the thoracic spine without and with contrast. 2. Mildly progressive tumor involving the superior endplate of T10. 3. No other definite osseous metastases identified. 4.  Aortic Atherosclerosis (ICD10-I70.0). 5. Critical Value/emergent results were called by telephone at the time of interpretation on 03/21/2024 at 2:24 pm to provider  JOSHUA LONG, who verbally acknowledged these results. Electronically Signed   By: Elsie Perone M.D.   On: 03/21/2024 14:25     Assessment/Plan: Estimated body mass index is 33.63 kg/m as calculated from the following:   Height as of this encounter: 5' 1 (1.549 m).   Weight as of this encounter: 71.43 kg.   78 year old female with a history of left upper extremity melanoma removed 2 years ago including 1 node, comes in with lower thoracic back pain.  She denies leg pain or numbness or tingling or weakness.  She has not had trouble walking.  She does use a rollator but walks her dog.  The pain in the back is been progressive over the last few weeks.  CT renal stone study done and in retrospect the destructive lesion of T10 can be seen on the imaging.  Now has fairly sizable tumor at T9 and T10 with epidural extension and canal stenosis but no obvious cord compression or signal change in the cord and a normal neurologic exam.  I have spoken with her at length about the findings.  Ideally, she would be a candidate for decompressive surgery followed by instrumented fusion.  We could not achieve gross total resection and it would not be curative but it probably would protect her ability to ambulate for longer.  At this point she is leaning towards no surgery and only palliative radiation or chemotherapy.  However, further discussions will be had as we gather information.  I recommend oncology and radiation oncology consults.  She will need staging with further workup to include MRI of the brain and CT of the chest abdomen pelvis.  This may or may not be melanoma and therefore we need to rule out other potential primaries.  Need to rule out other metastases.  We need to have some determination of longevity so that she can make informed decisions regarding surgical management of this tumor versus palliative treatment.   Alm GORMAN Molt 03/22/2024 7:59 AM

## 2024-03-22 NOTE — Progress Notes (Addendum)
 PROGRESS NOTE    Lynn Hogan  FMW:[id number] DOB: 06/09/1946 DOA: 03/21/2024 PCP: Joshua Franky Sharper, PA-C  Chief Complaint  Patient presents with   Back Pain    Brief Narrative:   Lynn Hogan is Lynn Hogan 78 y.o. 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    MRI showed metastatic disease at T9 and T10 as well as T8.   Seen by neurosurgery.  Appreciate oncology eval.  CT CAP and MRI brain for staging pending.    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 [name] today and he'll see her Will await further oncology eval prior to calling rad onc Pain management - she doesn't tolerate oral opiates.  Continue tylenol .  Add robaxin , gabapentin .  Morphine  prn.    History of Melanoma Malignant melanoma of L forearm 03/13/2022   Hypertension Metoprolol    Dyslipidemia Lipitor , zetia   CAD Noted, no CP  Addendum: tachycardia: follow EKG/tele.  (She developed this after the CT scan today)   DVT prophylaxis: lovenox  Code Status: full Family Communication: daughter at bedside Disposition:   Status is: Inpatient Remains inpatient appropriate because: need for ongoing inpt care   Consultants:   neurosurgery  Procedures:  none  Antimicrobials:  Anti-infectives (From admission, onward)    None       Subjective: Daughter at bedside  Objective: Vitals:   03/22/24 0455 03/22/24 0738 03/22/24 1141 03/22/24 1534  BP: 136/61 (!) 167/68 (!) 164/65 105/83  Pulse: 71 84 71 (!) 186  Resp: 18 19 18 20   Temp: 97.9 F (36.6 C) 98.3 F (36.8 C) 97.8 F (36.6 C) 98.1 F (36.7 C)  TempSrc: Oral  Oral Oral  SpO2: 95% 95% 95% 96%  Weight:      Height:        Intake/Output Summary (Last 24 hours) at 03/22/2024 1638 Last data filed at 03/22/2024 1145 Gross per 24 hour  Intake 1694.11 ml  Output 1650 ml  Net 44.11 ml   Filed Weights   03/21/24 1158  Weight: 80.7 kg    Examination:  General exam: Appears calm and comfortable  Respiratory system: unlabored Cardiovascular system: RRR Gastrointestinal system: Abdomen is nondistended, soft and nontender. Central nervous system: Alert and oriented. No focal neurological deficits. Extremities: no LEE   Data Reviewed: I have personally reviewed following labs and imaging studies  CBC: Recent Labs  Lab 03/21/24 1331 03/22/24 0443  WBC 8.6 7.5  NEUTROABS 6.2  --   HGB 13.1 12.1  HCT 39.8 37.2  MCV 94.8 96.4  PLT 339 302    Basic Metabolic Panel: Recent Labs  Lab 03/21/24 1331 03/22/24 0443  NA 140 138  K 3.9 4.0  CL 104 104  CO2 24 25  GLUCOSE 107* 122*  BUN 11 10  CREATININE 0.63 0.72  CALCIUM  9.8 9.1    GFR: Estimated Creatinine Clearance: 54.9 mL/min (by C-G formula based on SCr of 0.72 mg/dL).  Liver Function Tests: Recent Labs  Lab 03/22/24 0443  AST 22  ALT 14  ALKPHOS 53  BILITOT 0.5  PROT 6.4*  ALBUMIN 3.2*    CBG: No results for input(s): GLUCAP in the last 168 hours.   No results found for this or any previous visit (from the past 240 hours).       Radiology Studies: MR THORACIC SPINE W WO CONTRAST Result Date: 03/22/2024 CLINICAL DATA:  Metastatic disease  evaluation. EXAM: MRI THORACIC AND LUMBAR SPINE WITHOUT AND WITH CONTRAST TECHNIQUE: Multiplanar and multiecho pulse sequences of the thoracic and lumbar spine were obtained without and with intravenous contrast. CONTRAST:  8mL GADAVIST  GADOBUTROL  1 MMOL/ML IV SOLN COMPARISON:  Same day CT of the thoracic spine. FINDINGS: MRI THORACIC SPINE FINDINGS Alignment:  No substantial sagittal subluxation. Vertebrae: Bulky enhancing metastatic lesion at T9 with extraosseous extension posteriorly into the canal with resulting moderate to severe canal stenosis. Enhancing tumor also extends into the adjacent T10 vertebral body, right T9-T10 foramen as well as the right greater than left T9 posterior elements. Marai Teehan small focus of enhancement posteriorly at T8 is also compatible with Rifka Ramey metastasis. Cord:  Normal cord signal. Paraspinal and other soft tissues: Extraosseous extension of tumor at T9 is described above but also extends into the right paraspinal soft tissues. Disc levels: At T9, moderate to severe canal stenosis due to extraosseous extension of tumor is detailed above. Tumor also extends into and effaces the T9-T10 foramina bilaterally, greater on the right. Otherwise, no significant stenosis in the thoracic spine. MRI LUMBAR SPINE FINDINGS Segmentation:  Standard. Alignment:  Grade 2 anterolisthesis L5 on S1. Vertebrae: No fracture, evidence of discitis, or suspicious bone lesion. Bilateral L5 pars defects. Conus medullaris: Extends to the L1-L2 level and appears normal. Paraspinal and other soft tissues: Unremarkable. Disc levels: T12-L1: No significant disc protrusion, foraminal stenosis, or canal stenosis. L1-L2: No significant disc protrusion, foraminal stenosis, or canal stenosis. L2-L3: Disc bulging, ligamentum flavum thickening and mild facet arthropathy. No significant stenosis. L3-L4: Disc bulging, ligamentum flavum thickening facet arthropathy. No significant stenosis. L4-L5: Disc height loss. Disc bulging.  Mild left foraminal stenosis. Patent canal and right foramen. L5-S1: Grade 2 anterolisthesis. Bilateral pars defects. Uncovering of the disc. Moderate bilateral foraminal stenosis. Patent canal. IMPRESSION: 1. Bulky osseous metastatic disease at T9 (as detailed above) with extension inferiorly to involve T10. Extraosseous extension into the canal, paraspinal soft tissues, and T9-T10 foramina. Resulting moderate to severe canal stenosis at T9 and effaced foramina. 2. Additional small metastasis at T8. 3. At L5-S1, grade 2 anterolisthesis with bilateral pars defects and moderate bilateral foraminal stenosis. Electronically Signed   By: Gilmore GORMAN Molt M.D.   On: 03/22/2024 00:12   MR Lumbar Spine W Wo Contrast Result Date: 03/22/2024 CLINICAL DATA:  Metastatic disease evaluation. EXAM: MRI THORACIC AND LUMBAR SPINE WITHOUT AND WITH CONTRAST TECHNIQUE: Multiplanar and multiecho pulse sequences of the thoracic and lumbar spine were obtained without and with intravenous contrast. CONTRAST:  8mL GADAVIST  GADOBUTROL  1 MMOL/ML IV SOLN COMPARISON:  Same day CT of the thoracic spine. FINDINGS: MRI THORACIC SPINE FINDINGS Alignment:  No substantial sagittal subluxation. Vertebrae: Bulky enhancing metastatic lesion at T9 with extraosseous extension posteriorly into the canal with resulting moderate to severe canal stenosis. Enhancing tumor also extends into the adjacent T10 vertebral body, right T9-T10 foramen as  well as the right greater than left T9 posterior elements. Mikah Rottinghaus small focus of enhancement posteriorly at T8 is also compatible with Jerrie Schussler metastasis. Cord:  Normal cord signal. Paraspinal and other soft tissues: Extraosseous extension of tumor at T9 is described above but also extends into the right paraspinal soft tissues. Disc levels: At T9, moderate to severe canal stenosis due to extraosseous extension of tumor is detailed above. Tumor also extends into and effaces the T9-T10 foramina bilaterally, greater on the  right. Otherwise, no significant stenosis in the thoracic spine. MRI LUMBAR SPINE FINDINGS Segmentation:  Standard. Alignment:  Grade 2 anterolisthesis L5 on S1. Vertebrae: No fracture, evidence of discitis, or suspicious bone lesion. Bilateral L5 pars defects. Conus medullaris: Extends to the L1-L2 level and appears normal. Paraspinal and other soft tissues: Unremarkable. Disc levels: T12-L1: No significant disc protrusion, foraminal stenosis, or canal stenosis. L1-L2: No significant disc protrusion, foraminal stenosis, or canal stenosis. L2-L3: Disc bulging, ligamentum flavum thickening and mild facet arthropathy. No significant stenosis. L3-L4: Disc bulging, ligamentum flavum thickening facet arthropathy. No significant stenosis. L4-L5: Disc height loss. Disc bulging. Mild left foraminal stenosis. Patent canal and right foramen. L5-S1: Grade 2 anterolisthesis. Bilateral pars defects. Uncovering of the disc. Moderate bilateral foraminal stenosis. Patent canal. IMPRESSION: 1. Bulky osseous metastatic disease at T9 (as detailed above) with extension inferiorly to involve T10. Extraosseous extension into the canal, paraspinal soft tissues, and T9-T10 foramina. Resulting moderate to severe canal stenosis at T9 and effaced foramina. 2. Additional small metastasis at T8. 3. At L5-S1, grade 2 anterolisthesis with bilateral pars defects and moderate bilateral foraminal stenosis. Electronically Signed   By: Gilmore GORMAN Molt M.D.   On: 03/22/2024 00:12   CT Thoracic Spine Wo Contrast Result Date: 03/21/2024 CLINICAL DATA:  History of chronic back pain. Patient bent over to pick something up and felt Dutch Ing pop. Now with extreme mid back pain. 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 EXAM: CT THORACIC SPINE WITHOUT CONTRAST TECHNIQUE: Multidetector CT images of the thoracic were obtained using the standard protocol without intravenous contrast. RADIATION DOSE REDUCTION: This exam was performed  according to the departmental dose-optimization program which includes automated exposure control, adjustment of the mA and/or kV according to patient size and/or use of iterative reconstruction technique. COMPARISON:  Abdominopelvic CT 01/13/2024.  PET-CT 11/03/2022. FINDINGS: Alignment: Minimal convex right scoliosis. No significant focal angulation or listhesis. Vertebrae: Tracer-avid metastatic disease was demonstrated within the T9 and T10 vertebral bodies on previous PET-CT. The disease within the T9 vertebral body has significantly progressed in the interval with Layan Zalenski very large lytic lesion nearly replacing the vertebral body, measuring up to 4.2 x 3.7 cm transverse on axial image 102/9. There is significant posterior extension of tumor into the spinal canal, expected to cause compression of the thoracic cord at this level. Mildly progressive tumor involving the superior endplate of T10. Associated significant soft tissue foraminal narrowing bilaterally at T9-10, worse on the right. No other definite lesions or fractures identified. Paraspinal and other soft tissues: As above, paraspinal tumor at T9 associated with lytic osseous metastatic disease and extending into the T9-10 foramina bilaterally. No definite paraspinal hematoma or other acute paraspinal findings. Aortic and coronary artery atherosclerosis noted. Pulmonary assessment limited by breathing artifact. There is perihilar atelectasis bilaterally. Grossly stable small left lung nodules. Disc levels: Mild spondylosis with mild endplate osteophyte formation. No large disc herniation identified. As above, significant spinal stenosis due to tumor at T9 and T9-10. IMPRESSION: 1. Significant progression  of lytic osseous metastatic disease within the T9 vertebral body with significant posterior extension of tumor into the spinal canal, expected to cause compression of the thoracic cord at this level. Recommend spine surgical consultation and further  evaluation with MRI of the thoracic spine without and with contrast. 2. Mildly progressive tumor involving the superior endplate of T10. 3. No other definite osseous metastases identified. 4.  Aortic Atherosclerosis (ICD10-I70.0). 5. Critical Value/emergent results were called by telephone at the time of interpretation on 03/21/2024 at 2:24 pm to provider JOSHUA LONG, who verbally acknowledged these results. Electronically Signed   By: Elsie Perone M.D.   On: 03/21/2024 14:25        Scheduled Meds:  acetaminophen   1,000 mg Oral Q8H   atorvastatin   40 mg Oral QPM   enoxaparin  (LOVENOX ) injection  40 mg Subcutaneous Q24H   ezetimibe   10 mg Oral Daily   gabapentin   100 mg Oral TID   methocarbamol  (ROBAXIN ) injection  500 mg Intravenous Q8H   metoprolol  succinate  50 mg Oral QPM   Continuous Infusions:  lactated ringers  100 mL/hr at 03/22/24 0948     LOS: 0 days    Time spent: over 30 min     Meliton Monte, MD [facility]   To contact the attending provider between 7A-7P or the covering provider during after hours 7P-7A, please log into the web site [email or web address] and access using universal [place] password for that web site. If you do not have the password, please call the hospital operator.  03/22/2024, 4:38 PM

## 2024-03-22 NOTE — Plan of Care (Signed)

## 2024-03-22 NOTE — TOC Initial Note (Signed)
 Transition of Care [facility]) - Initial/Assessment Note    Patient Details  Name: Lynn Hogan MRN: [id number] Date of Birth: [birth year]  Transition of Care [facility] CM/SW Contact:    Andrez JULIANNA George, RN Phone Number: 03/22/2024, 3:10 PM  Clinical Narrative:                 Lynn Hogan is a 78 y.o. 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   She is from home alone. Her daughter and grandson check on her.  She manages her own medications.  Her daughter provides needed transportation.  Awaiting work up.   IP Care management following.  Expected Discharge Plan:  (TBD) Barriers to Discharge: Continued Medical Work up   Patient Goals and CMS Choice            Expected Discharge Plan and Services   Discharge Planning Services: CM Consult   Living arrangements for the past 2 months: Single Family Home                                      Prior Living Arrangements/Services Living arrangements for the past 2 months: Single Family Home Lives with:: Self Patient language and need for interpreter reviewed:: Yes Do you feel safe going back to the place where you live?: Yes          Current home services: DME (cane/ rollator/ shower seat) Criminal Activity/Legal Involvement Pertinent to Current Situation/Hospitalization: No - Comment as needed  Activities of Daily Living   ADL Screening (condition at time of admission) Independently performs ADLs?: Yes (appropriate for developmental age) Is the patient deaf or have difficulty hearing?: No Does the patient have difficulty seeing, even when wearing glasses/contacts?: No Does the patient have difficulty concentrating, remembering, or making decisions?:  No  Permission Sought/Granted                  Emotional Assessment Appearance:: Appears stated age Attitude/Demeanor/Rapport: Engaged Affect (typically observed): Accepting Orientation: : Oriented to Self, Oriented to Place, Oriented to  Time, Oriented to Situation   Psych Involvement: No (comment)  Admission diagnosis:  Spinal cord mass (HCC) [G95.89] 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) [G95.29, C79.51] Patient Active Problem List   Diagnosis Date Noted   Spinal cord mass (HCC) 03/21/2024   Bone lesion 01/25/2024   History of heart failure 01/25/2024   Hypertension 01/25/2024   Ischemic cardiomyopathy 01/25/2024   Malignant melanoma of left forearm (HCC) 02/12/2022   CAD (coronary artery disease) 08/11/2018   Acute systolic heart failure (HCC) 08/11/2018   STEMI (ST elevation myocardial infarction) (HCC) 08/08/2018   STEMI involving left anterior descending coronary artery (HCC) 08/08/2018   NSTEMI (non-ST elevated myocardial infarction) (HCC) 07/31/2018   Hyperlipidemia 07/31/2018   Arthritis, multiple joint involvement 07/31/2018   Disc degeneration, lumbar 09/08/2017   Spondylolisthesis, lumbar region 09/08/2017   PCP:  Joshua Franky Sharper, PA-C Pharmacy:   [facility] Delivery - [place], [place] - [address] [place] [address] Phone: [phone] Fax: [phone]  [facility] [address] [place], [place] - [address] AT [facility] [address] [place] [address] Phone: [phone] Fax: [phone]  [id number] [place], [place] - [address] AT [facility] OF [facility] & [facility] [address] [place] [address] Phone: [phone] Fax: [phone]  [facility] [address], [place] - [address] AT [facility] OF [facility] & [address] [facility] ST [facility] [place]  [address] Phone: [phone] Fax: [phone]     Social Drivers of Health (SDOH) Social History: SDOH Screenings   Food Insecurity: No Food Insecurity (03/22/2024)  Housing: Low Risk  (03/22/2024)  Transportation Needs: No Transportation Needs (03/22/2024)  Utilities: Not At Risk (03/22/2024)  Financial Resource Strain: Low Risk  (08/01/2018)  Physical Activity: Sufficiently Active (08/01/2018)  Social Connections: Socially Isolated (03/22/2024)  Stress: No Stress Concern Present (08/01/2018)  Tobacco Use: Low Risk  (03/22/2024)   SDOH Interventions:     Readmission Risk Interventions     No data to display

## 2024-03-22 NOTE — Plan of Care (Signed)

## 2024-03-22 NOTE — Care Management Obs Status (Signed)
 MEDICARE OBSERVATION STATUS NOTIFICATION   Patient Details  Name: Lynn Hogan MRN: [id number] Date of Birth: 1946-04-30   Medicare Observation Status Notification Given:  Yes  Verbally reviewed observation notice with [name] Gaskins telephonically at [phone].  Daughter has ake that I mail a copy to the patient home address.    Saretta Dahlem 03/22/2024, 2:19 PM

## 2024-03-22 NOTE — Plan of Care (Signed)

## 2024-03-22 NOTE — Consult Note (Addendum)
 New Hematology/Oncology Consult   Requesting MD:  Meliton Monte      Reason for Consult: Destructive T9 lesion, history of melanoma  HPI: Lynn Hogan presented to the emergency room yesterday with back pain.  She reports a 2-day history of mid back pain which began after bending over to feed her dog.  No other complaint.  No leg numbness, weakness, or difficulty with bowel/bladder control. She has a history of a left forearm melanoma resected in 2023.  She was evaluated by medical oncology and adjuvant pembrolizumab was recommended.     Past Medical History:  Diagnosis Date   Coronary artery disease    Hyperlipidemia    NSTEMI (non-ST elevated myocardial infarction) (HCC)    08/02/2018-PCI/DES to RCA    STEMI (ST elevation myocardial infarction) (HCC)    08/08/2018-PCI/DES to mLAD, 85% osital stenosis of 1st diag.    Systolic heart failure (HCC)    . G3P3    Past Surgical History:  Procedure Laterality Date   CARDIAC CATHETERIZATION     CATARACT EXTRACTION, BILATERAL  02/12/2016   CORONARY STENT INTERVENTION  08/02/2018   CORONARY STENT INTERVENTION N/A 08/02/2018   Procedure: CORONARY STENT INTERVENTION;  Surgeon: Mady Bruckner, MD;  Location: [facility];  Service: Cardiovascular;  Laterality: N/A;   CORONARY/GRAFT ACUTE MI REVASCULARIZATION N/A 08/08/2018   Procedure: Coronary/Graft Acute MI Revascularization;  Surgeon: Burnard Debby LABOR, MD;  Location: [facility];  Service: Cardiovascular;  Laterality: N/A;   HYSTERECTOMY ABDOMINAL WITH SALPINGECTOMY  02/12/1971   LEFT HEART CATH AND CORONARY ANGIOGRAPHY N/A 08/02/2018   Procedure: LEFT HEART CATH AND CORONARY ANGIOGRAPHY;  Surgeon: Mady Bruckner, MD;  Location: [facility];  Service: Cardiovascular;  Laterality: N/A;   LEFT HEART CATH AND CORONARY ANGIOGRAPHY N/A 08/08/2018   Procedure: LEFT HEART CATH AND CORONARY ANGIOGRAPHY;  Surgeon: Burnard Debby LABOR, MD;  Location: [facility];  Service:  Cardiovascular;  Laterality: N/A;  :   Current Facility-Administered Medications:    acetaminophen  (TYLENOL ) tablet 1,000 mg, 1,000 mg, Oral, Q8H, Monte, A Meliton Raddle., MD, 1,000 mg at 03/22/24 1249   atorvastatin  (LIPITOR ) tablet 40 mg, 40 mg, Oral, QPM, Monte LABOR Meliton Raddle., MD   enoxaparin  (LOVENOX ) injection 40 mg, 40 mg, Subcutaneous, Q24H, Garba, Mohammad L, MD, 40 mg at 03/21/24 2118   ezetimibe  (ZETIA ) tablet 10 mg, 10 mg, Oral, Daily, Monte LABOR Meliton Raddle., MD, 10 mg at 03/22/24 1133   gabapentin  (NEURONTIN ) capsule 100 mg, 100 mg, Oral, TID, Monte LABOR Meliton Raddle., MD, 100 mg at 03/22/24 1133   lactated ringers  infusion, , Intravenous, Continuous, Sim Emery CROME, MD, Last Rate: 100 mL/hr at 03/22/24 0948, New Bag at 03/22/24 0948   methocarbamol  (ROBAXIN ) injection 500 mg, 500 mg, Intravenous, Q8H, Monte LABOR Meliton Raddle., MD, 500 mg at 03/22/24 1249   metoprolol  succinate (TOPROL -XL) 24 hr tablet 50 mg, 50 mg, Oral, QPM, Monte LABOR Meliton Raddle., MD   morphine  (PF) 2 MG/ML injection 4 mg, 4 mg, Intravenous, Q2H PRN, Vann, Jessica U, DO, 4 mg at 03/22/24 1533   ondansetron  (ZOFRAN ) tablet 4 mg, 4 mg, Oral, Q6H PRN **OR** ondansetron  (ZOFRAN ) injection 4 mg, 4 mg, Intravenous, Q6H PRN, Sim Emery L, MD, 4 mg at 03/22/24 1133:   acetaminophen   1,000 mg Oral Q8H   atorvastatin   40 mg Oral QPM   enoxaparin  (LOVENOX ) injection  40 mg Subcutaneous Q24H   ezetimibe   10 mg Oral Daily   gabapentin   100 mg Oral TID   methocarbamol  (ROBAXIN ) injection  500 mg Intravenous Q8H   metoprolol  succinate  50 mg Oral QPM  :   Allergies  Allergen Reactions   Ibuprofen Palpitations   Nsaids Palpitations    Patient stated she has not tried all NSAIDS to see if they give her palpitations, but she is not willing to try anything else- just in case  :  QY:Fnuyzm with lung cancer-smoker  SOCIAL HISTORY: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   Physical Exam:  Blood pressure 105/83, pulse (!) 186, temperature 98.1 F (36.7 C), temperature source Oral, resp. rate 20, height 5' 1 (1.549 m), weight 178 lb (80.7 kg), SpO2 96%.  HEENT: Oral cavity without visible mass, neck without mass Lungs: Clear bilaterally Cardiac: Tachycardia, irregular Abdomen: No hepatosplenomegaly Vascular: No leg edema Lymph nodes: No cervical, supraclavicular, axillary, or inguinal nodes Neurologic: Alert and oriented, the motor exam appears intact in the upper and lower extremities bilaterally.  Sensation is intact to light touch in the lower extremities bilaterally Skin: No rash, left forearm scar without evidence of recurrent tumor Musculoskeletal: No spine tenderness  LABS:   Recent Labs    03/21/24 1331 03/22/24 0443  WBC 8.6 7.5  HGB 13.1 12.1  HCT 39.8 37.2  PLT 339 302     Recent Labs    03/21/24 1331 03/22/24 0443  NA 140 138  K 3.9 4.0  CL 104 104  CO2 24 25  GLUCOSE 107* 122*  BUN 11 10  CREATININE 0.63 0.72  CALCIUM  9.8 9.1      RADIOLOGY:  MR THORACIC SPINE W WO CONTRAST Result Date: 03/22/2024 CLINICAL DATA:  Metastatic disease evaluation. EXAM: MRI THORACIC AND LUMBAR SPINE WITHOUT AND WITH CONTRAST TECHNIQUE: Multiplanar and multiecho pulse sequences of the thoracic and lumbar spine were obtained without and with intravenous contrast. CONTRAST:  8mL GADAVIST  GADOBUTROL  1 MMOL/ML IV SOLN COMPARISON:  Same day CT of the thoracic spine. FINDINGS: MRI THORACIC SPINE FINDINGS Alignment:  No substantial sagittal subluxation. Vertebrae: Bulky enhancing metastatic lesion at T9 with extraosseous extension posteriorly into the canal with resulting moderate to severe canal stenosis. Enhancing tumor also extends into the adjacent T10 vertebral body, right T9-T10 foramen as well as the right  greater than left T9 posterior elements. A small focus of enhancement posteriorly at T8 is also compatible with a metastasis. Cord:  Normal cord signal. Paraspinal and other soft tissues: Extraosseous extension of tumor at T9 is described above but also extends into the right paraspinal soft tissues. Disc levels: At T9, moderate to severe canal stenosis due to extraosseous extension of tumor is detailed above. Tumor also extends into and effaces the T9-T10 foramina bilaterally, greater on the right. Otherwise, no significant stenosis in the thoracic spine. MRI LUMBAR SPINE FINDINGS Segmentation:  Standard. Alignment:  Grade 2 anterolisthesis L5 on S1. Vertebrae: No fracture, evidence of discitis, or suspicious bone lesion. Bilateral L5 pars defects. Conus medullaris: Extends to the L1-L2 level and appears normal. Paraspinal and other soft tissues: Unremarkable. Disc levels: T12-L1: No significant disc protrusion, foraminal stenosis, or canal stenosis. L1-L2: No significant disc protrusion, foraminal stenosis, or canal stenosis. L2-L3: Disc bulging, ligamentum flavum thickening and mild facet arthropathy. No significant stenosis. L3-L4: Disc bulging, ligamentum flavum thickening facet arthropathy. No significant stenosis. L4-L5: Disc height  loss. Disc bulging. Mild left foraminal stenosis. Patent canal and right foramen. L5-S1: Grade 2 anterolisthesis. Bilateral pars defects. Uncovering of the disc. Moderate bilateral foraminal stenosis. Patent canal. IMPRESSION: 1. Bulky osseous metastatic disease at T9 (as detailed above) with extension inferiorly to involve T10. Extraosseous extension into the canal, paraspinal soft tissues, and T9-T10 foramina. Resulting moderate to severe canal stenosis at T9 and effaced foramina. 2. Additional small metastasis at T8. 3. At L5-S1, grade 2 anterolisthesis with bilateral pars defects and moderate bilateral foraminal stenosis. Electronically Signed   By: Gilmore GORMAN Molt M.D.    On: 03/22/2024 00:12   MR Lumbar Spine W Wo Contrast Result Date: 03/22/2024 CLINICAL DATA:  Metastatic disease evaluation. EXAM: MRI THORACIC AND LUMBAR SPINE WITHOUT AND WITH CONTRAST TECHNIQUE: Multiplanar and multiecho pulse sequences of the thoracic and lumbar spine were obtained without and with intravenous contrast. CONTRAST:  8mL GADAVIST  GADOBUTROL  1 MMOL/ML IV SOLN COMPARISON:  Same day CT of the thoracic spine. FINDINGS: MRI THORACIC SPINE FINDINGS Alignment:  No substantial sagittal subluxation. Vertebrae: Bulky enhancing metastatic lesion at T9 with extraosseous extension posteriorly into the canal with resulting moderate to severe canal stenosis. Enhancing tumor also extends into the adjacent T10 vertebral body, right T9-T10 foramen as well as the right greater than left T9 posterior elements. A small focus of enhancement posteriorly at T8 is also compatible with a metastasis. Cord:  Normal cord signal. Paraspinal and other soft tissues: Extraosseous extension of tumor at T9 is described above but also extends into the right paraspinal soft tissues. Disc levels: At T9, moderate to severe canal stenosis due to extraosseous extension of tumor is detailed above. Tumor also extends into and effaces the T9-T10 foramina bilaterally, greater on the right. Otherwise, no significant stenosis in the thoracic spine. MRI LUMBAR SPINE FINDINGS Segmentation:  Standard. Alignment:  Grade 2 anterolisthesis L5 on S1. Vertebrae: No fracture, evidence of discitis, or suspicious bone lesion. Bilateral L5 pars defects. Conus medullaris: Extends to the L1-L2 level and appears normal. Paraspinal and other soft tissues: Unremarkable. Disc levels: T12-L1: No significant disc protrusion, foraminal stenosis, or canal stenosis. L1-L2: No significant disc protrusion, foraminal stenosis, or canal stenosis. L2-L3: Disc bulging, ligamentum flavum thickening and mild facet arthropathy. No significant stenosis. L3-L4: Disc bulging,  ligamentum flavum thickening facet arthropathy. No significant stenosis. L4-L5: Disc height loss. Disc bulging. Mild left foraminal stenosis. Patent canal and right foramen. L5-S1: Grade 2 anterolisthesis. Bilateral pars defects. Uncovering of the disc. Moderate bilateral foraminal stenosis. Patent canal. IMPRESSION: 1. Bulky osseous metastatic disease at T9 (as detailed above) with extension inferiorly to involve T10. Extraosseous extension into the canal, paraspinal soft tissues, and T9-T10 foramina. Resulting moderate to severe canal stenosis at T9 and effaced foramina. 2. Additional small metastasis at T8. 3. At L5-S1, grade 2 anterolisthesis with bilateral pars defects and moderate bilateral foraminal stenosis. Electronically Signed   By: Gilmore GORMAN Molt M.D.   On: 03/22/2024 00:12   CT Thoracic Spine Wo Contrast Result Date: 03/21/2024 CLINICAL DATA:  History of chronic back pain. Patient bent over to pick something up and felt a pop. Now with extreme mid back pain. 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 EXAM: CT THORACIC SPINE WITHOUT CONTRAST TECHNIQUE: Multidetector CT images of the thoracic were obtained using the standard protocol without intravenous contrast. RADIATION DOSE REDUCTION: This exam was performed according to the departmental dose-optimization program which includes automated exposure control, adjustment of the mA and/or kV according to patient size and/or use  of iterative reconstruction technique. COMPARISON:  Abdominopelvic CT 01/13/2024.  PET-CT 11/03/2022. FINDINGS: Alignment: Minimal convex right scoliosis. No significant focal angulation or listhesis. Vertebrae: Tracer-avid metastatic disease was demonstrated within the T9 and T10 vertebral bodies on previous PET-CT. The disease within the T9 vertebral body has significantly progressed in the interval with a very large lytic lesion nearly replacing the vertebral body, measuring up to 4.2 x 3.7 cm  transverse on axial image 102/9. There is significant posterior extension of tumor into the spinal canal, expected to cause compression of the thoracic cord at this level. Mildly progressive tumor involving the superior endplate of T10. Associated significant soft tissue foraminal narrowing bilaterally at T9-10, worse on the right. No other definite lesions or fractures identified. Paraspinal and other soft tissues: As above, paraspinal tumor at T9 associated with lytic osseous metastatic disease and extending into the T9-10 foramina bilaterally. No definite paraspinal hematoma or other acute paraspinal findings. Aortic and coronary artery atherosclerosis noted. Pulmonary assessment limited by breathing artifact. There is perihilar atelectasis bilaterally. Grossly stable small left lung nodules. Disc levels: Mild spondylosis with mild endplate osteophyte formation. No large disc herniation identified. As above, significant spinal stenosis due to tumor at T9 and T9-10. IMPRESSION: 1. Significant progression of lytic osseous metastatic disease within the T9 vertebral body with significant posterior extension of tumor into the spinal canal, expected to cause compression of the thoracic cord at this level. Recommend spine surgical consultation and further evaluation with MRI of the thoracic spine without and with contrast. 2. Mildly progressive tumor involving the superior endplate of T10. 3. No other definite osseous metastases identified. 4.  Aortic Atherosclerosis (ICD10-I70.0). 5. Critical Value/emergent results were called by telephone at the time of interpretation on 03/21/2024 at 2:24 pm to provider JOSHUA LONG, who verbally acknowledged these results. Electronically Signed   By: Elsie Perone M.D.   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 x 0.8 cm, Breslow thickness 14 mm, Clark level V, stage IIc (pT4b pN0  ( 03/28/2022 needle biopsy left axillary lymph node-lymphoid tissue, no evidence of malignancy 04/24/2022 left forearm reexcision-no evidence of residual melanoma, 2 left axillary sentinel nodes negative for malignancy 11/23/2022 PET: Tracer avid lytic bone lesions at T9 and T10 03/21/2024 CT thoracic spine: Progression of lytic osseous metastatic disease at T9 with significant posterior extension into the spinal canal, mild progressive tumor at the superior endplate of T10 03/21/2024 MRI thoracic and lumbar spine: Metastatic disease at T9 with extension to T10, extraosseous extension into the spinal canal and paraspinal soft tissues, moderate to severe canal stenosis at T9, small T8 metastasis 03/22/2024 CTs: Left apical pleural-based nodules favored scarring, no evidence of a primary malignancy or soft tissue metastasis, T9 and T10 metastases  2.  Back pain secondary to a destructive mass at T9 3.  Hyperlipidemia 4.  History of coronary artery disease, NSTEMI 2020, STEMI 2020 5.  Hypertension 6.  History of CHF 7.  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  She has a destructive lesion at T9 with extension into the spinal canal.  Lynn Hogan has a history of melanoma diagnosed in 2023.  There is no other apparent tumor site based on physical examination and the staging CTs today.  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  The differential diagnosis  includes metastatic carcinoma from another primary tumor site and a hematopoietic malignancy.  I discussed the probable diagnosis and treatment options with Lynn Hogan.  She understands the risk of developing a cord compression syndrome secondary to tumor involving the thoracic spinal canal/cord.  We discussed the indication for proceeding with decompressive surgery to prevent neurologic symptoms and obtain diagnostic tissue.  We  discussed potential systemic treatment options including immunotherapy and targeted therapies.  There is a high chance of a clinical response with immunotherapy in patients with metastatic melanoma.  She may also be a candidate for targeted therapy based on molecular testing on the tumor.  Lynn Hogan understands systemic treatment is not expected to result in a rapid remission.  She could develop a cord compression syndrome while waiting to begin or following initiation of systemic therapy.  Lynn Hogan understands the risk of treatment delay.  She favors a diagnostic biopsy prior to deciding on treatment.  I recommended she have further discussions with Dr. Joshua regarding the surgical plan and risk of neurologic deterioration without surgery.  Recommendations: 1.  Complete staging with a brain MRI 2.  Obtain diagnostic tissue via a spine decompression surgery versus a CT-guided biopsy of the spine 3.  Consider a short course of Decadron  for pain and to potentially decrease the chance of neurologic deterioration (she will need to be off of Decadron  to receive immunotherapy) 4.  EKG, evaluate tachycardia per the medical service-I contacted Dr. Perri 5.  Please call oncology as needed, I will check on her 03/24/2024 and outpatient follow-up will be scheduled at the Cancer center  Arley Hof, MD 03/22/2024, 5:11 PM

## 2024-03-23 DIAGNOSIS — G9589 Other specified diseases of spinal cord: Secondary | ICD-10-CM | POA: Diagnosis not present

## 2024-03-23 MED ORDER — DEXAMETHASONE SODIUM PHOSPHATE 4 MG/ML IJ SOLN
4.0000 mg | Freq: Three times a day (TID) | INTRAMUSCULAR | Status: DC
Start: 2024-03-23 — End: 2024-03-25
  Administered 2024-03-23 – 2024-03-25 (×6): 4 mg via INTRAVENOUS
  Filled 2024-03-23 (×6): qty 1

## 2024-03-23 MED ORDER — TAPENTADOL HCL 50 MG PO TABS
50.0000 mg | ORAL_TABLET | ORAL | Status: DC | PRN
  Filled 2024-03-23: qty 1

## 2024-03-23 NOTE — Progress Notes (Signed)
 PROGRESS NOTE    Lynn Hogan  FMW:[id number] DOB: 11/14/1945 DOA: 03/21/2024 PCP: Lynn Franky Sharper, PA-C   Brief Narrative:  Lynn Hogan is a 78 y.o. 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  MRI showed metastatic disease at T9 and T10 as well as T8.  Seen by neurosurgery oncology.  Assessment & Plan:   Principal Problem:   Spinal cord mass (HCC) Active Problems:   Hyperlipidemia   CAD (coronary artery disease)   Bone lesion   History of heart failure   Hypertension   Malignant melanoma of left forearm (HCC)  Back Pain due to Metastatic Cancer Osseus Metastatic Disease at T9 with Extension inferiorly to involve T10  T8 Metastasis/moderate to severe canal stenosis. Neurosurgery following - recommending staging with CT C/A/P and MRI brain as well as oncology and radiation oncology.  CT chest abdomen pelvis was completed, no primary source was found.  MRI brain ordered but patient refused x 2 and she does not think she can lay flat on the table due to back pain.  I have offered her pain medication but she still is refusing.  I have canceled MRI per her request.  Seen by oncology, they recommended decompression surgery.  Seen by neurosurgery/Dr. Joshua who also recommended decompression surgery but patient was leaning towards having biopsy first.  After speaking to patient today, she appears to have several questions around her surgery before committing to that.  She is waiting to talk to Dr. Joshua once again so she can get the answers.  Continue gabapentin , dexamethasone  and morphine .   History of Melanoma Malignant melanoma of L forearm 03/13/2022    Hypertension Metoprolol     Dyslipidemia Lipitor , zetia     CAD Noted, no CP  DVT prophylaxis: enoxaparin  (LOVENOX ) injection 40 mg Start: 03/21/24 2030   Code Status: Full Code  Family Communication:  None present at bedside.  Plan of care discussed with patient in length and he/she verbalized understanding and agreed with it.  Status is: Inpatient Remains inpatient appropriate because: Awaiting to talk to neurosurgery again   Estimated body mass index is 33.63 kg/m as calculated from the following:   Height as of this encounter: 5' 1 (1.549 m).   Weight as of this encounter: 80.7 kg.    Nutritional Assessment: Body mass index is 33.63 kg/m.[name] Seen by dietician.  I agree with the assessment and plan as outlined below: Nutrition Status:        . Skin Assessment: I have examined the patient's skin and I agree with the wound assessment as performed by the wound care RN as outlined below:    Consultants:  Neurosurgery and oncology  Procedures:  As above  Antimicrobials:  Anti-infectives (From admission, onward)    None         Subjective: Patient seen and examined, she has no complaint at the moment.  No back pain.  She is satisfied with the pain medication and pain control.  She has no focal weakness.  She has a lot of questions regarding the surgery and is waiting to talk to Dr. Joshua again.  Objective: Vitals:   03/22/24 1534 03/22/24 1800 03/22/24 2106 03/23/24 0810  BP: 105/83  (!) 94/48 (!) 170/76  Pulse: (!) 186 (!) 143 72 61  Resp: 20  16  19  Temp: 98.1 F (36.7 C)  98.2 F (36.8 C) 97.9 F (36.6 C)  TempSrc: Oral  Oral   SpO2: 96%  96% 95%  Weight:      Height:        Intake/Output Summary (Last 24 hours) at 03/23/2024 1005 Last data filed at 03/22/2024 2108 Gross per 24 hour  Intake 120 ml  Output 650 ml  Net -530 ml   Filed Weights   03/21/24 1158  Weight: 80.7 kg    Examination:  General exam: Appears calm and comfortable  Respiratory system: Clear to auscultation. Respiratory effort  normal. Cardiovascular system: S1 & S2 heard, RRR. No JVD, murmurs, rubs, gallops or clicks. No pedal edema. Gastrointestinal system: Abdomen is nondistended, soft and nontender. No organomegaly or masses felt. Normal bowel sounds heard. Central nervous system: Alert and oriented. No focal neurological deficits. Extremities: Symmetric 5 x 5 power. Skin: No rashes, lesions or ulcers Psychiatry: Judgement and insight appear normal. Mood & affect appropriate.    Data Reviewed: I have personally reviewed following labs and imaging studies  CBC: Recent Labs  Lab 03/21/24 1331 03/22/24 0443  WBC 8.6 7.5  NEUTROABS 6.2  --   HGB 13.1 12.1  HCT 39.8 37.2  MCV 94.8 96.4  PLT 339 302   Basic Metabolic Panel: Recent Labs  Lab 03/21/24 1331 03/22/24 0443  NA 140 138  K 3.9 4.0  CL 104 104  CO2 24 25  GLUCOSE 107* 122*  BUN 11 10  CREATININE 0.63 0.72  CALCIUM  9.8 9.1   GFR: Estimated Creatinine Clearance: 54.9 mL/min (by C-G formula based on SCr of 0.72 mg/dL). Liver Function Tests: Recent Labs  Lab 03/22/24 0443  AST 22  ALT 14  ALKPHOS 53  BILITOT 0.5  PROT 6.4*  ALBUMIN 3.2*   No results for input(s): LIPASE, AMYLASE in the last 168 hours. No results for input(s): AMMONIA in the last 168 hours. Coagulation Profile: No results for input(s): INR, PROTIME in the last 168 hours. Cardiac Enzymes: No results for input(s): CKTOTAL, CKMB, CKMBINDEX, TROPONINI in the last 168 hours. BNP (last 3 results) No results for input(s): PROBNP in the last 8760 hours. HbA1C: No results for input(s): HGBA1C in the last 72 hours. CBG: No results for input(s): GLUCAP in the last 168 hours. Lipid Profile: No results for input(s): CHOL, HDL, LDLCALC, TRIG, CHOLHDL, LDLDIRECT in the last 72 hours. Thyroid Function Tests: No results for input(s): TSH, T4TOTAL, FREET4, T3FREE, THYROIDAB in the last 72 hours. Anemia Panel: No results for  input(s): VITAMINB12, FOLATE, FERRITIN, TIBC, IRON, RETICCTPCT in the last 72 hours. Sepsis Labs: No results for input(s): PROCALCITON, LATICACIDVEN in the last 168 hours.  No results found for this or any previous visit (from the past 240 hours).   Radiology Studies: CT CHEST ABDOMEN PELVIS W CONTRAST Result Date: 03/22/2024 CLINICAL DATA:  Thoracic spine CT and MRI demonstrating metastatic disease centered about T9. Evaluate for primary malignancy. * Tracking Code: BO * EXAM: CT CHEST, ABDOMEN, AND PELVIS WITH CONTRAST TECHNIQUE: Multidetector CT imaging of the chest, abdomen and pelvis was performed following the standard protocol during bolus administration of intravenous contrast. RADIATION DOSE REDUCTION: This exam was performed according to the departmental dose-optimization program which includes automated exposure control, adjustment of the mA and/or kV according to patient size and/or use of iterative reconstruction technique. CONTRAST:  75mL OMNIPAQUE  IOHEXOL  350 MG/ML SOLN COMPARISON:  Thoracolumbar spine MR 03/21/2024. CT stone study 01/13/2024. FINDINGS: CT CHEST FINDINGS  Cardiovascular: Aortic atherosclerosis. Mild cardiomegaly, without pericardial effusion. Three vessel coronary artery calcification. No central pulmonary embolism, on this non-dedicated study. Mediastinum/Nodes: No supraclavicular adenopathy. No mediastinal or hilar adenopathy. Tiny hiatal hernia. Apparent soft tissue fullness within the gastroesophageal junction including on 40/3 is likely due to underdistention and redundancy in the setting of hiatal hernia. Lungs/Pleura: Trace left pleural fluid may be physiologic. Mild motion degradation throughout the chest. Bibasilar scarring. Apparent left apical pleural-based nodules x2, including an up to 5 mm on 23/5 are favored to be related to minimal pleuroparenchymal scarring. No suspicious dominant pulmonary nodule or mass. Musculoskeletal: Included within the  abdomen pelvic section. CT ABDOMEN PELVIS FINDINGS Hepatobiliary: Normal liver. Normal gallbladder, without biliary ductal dilatation. Pancreas: Normal, without mass or ductal dilatation. Spleen: Normal in size, without focal abnormality. Adrenals/Urinary Tract: Normal adrenal glands. Interpolar subcentimeter left renal lesion is too small to characterize but favored to represent a cyst. No follow-up indicated. Normal right kidney. No hydronephrosis. Vascular calcifications anterior to the left proximal ureter. Normal urinary bladder. Stomach/Bowel: Remainder of the stomach is underdistended. Colonic stool burden suggests constipation. Normal terminal ileum. Normal small bowel. Vascular/Lymphatic: Aortic atherosclerosis. No abdominopelvic adenopathy. Reproductive: Hysterectomy.  No adnexal mass. Other: No significant free fluid.  Mild pelvic floor laxity. Musculoskeletal: Bilateral L5 pars defects. Grade 2 L5-S1 anterolisthesis. Osseous metastasis centered about T9 and T10 were better evaluated on dedicated CT and MRI. IMPRESSION: 1. No evidence of primary malignancy or soft tissue metastasis within the chest, abdomen, or pelvis. 2. Minimally motion degraded evaluation of the chest. 3. T9 and T10 osseous metastasis, as before. 4. Tiny hiatal hernia. Apparent mild soft tissue fullness at the gastroesophageal junction is likely due to underdistention and redundancy. If there are symptoms to suggest distal esophageal mass, consider endoscopy. 5. Incidental findings, including: Coronary artery atherosclerosis. Aortic Atherosclerosis (ICD10-I70.0). Electronically Signed   By: Rockey Kilts M.D.   On: 03/22/2024 17:52   MR THORACIC SPINE W WO CONTRAST Result Date: 03/22/2024 CLINICAL DATA:  Metastatic disease evaluation. EXAM: MRI THORACIC AND LUMBAR SPINE WITHOUT AND WITH CONTRAST TECHNIQUE: Multiplanar and multiecho pulse sequences of the thoracic and lumbar spine were obtained without and with intravenous contrast.  CONTRAST:  8mL GADAVIST  GADOBUTROL  1 MMOL/ML IV SOLN COMPARISON:  Same day CT of the thoracic spine. FINDINGS: MRI THORACIC SPINE FINDINGS Alignment:  No substantial sagittal subluxation. Vertebrae: Bulky enhancing metastatic lesion at T9 with extraosseous extension posteriorly into the canal with resulting moderate to severe canal stenosis. Enhancing tumor also extends into the adjacent T10 vertebral body, right T9-T10 foramen as well as the right greater than left T9 posterior elements. A small focus of enhancement posteriorly at T8 is also compatible with a metastasis. Cord:  Normal cord signal. Paraspinal and other soft tissues: Extraosseous extension of tumor at T9 is described above but also extends into the right paraspinal soft tissues. Disc levels: At T9, moderate to severe canal stenosis due to extraosseous extension of tumor is detailed above. Tumor also extends into and effaces the T9-T10 foramina bilaterally, greater on the right. Otherwise, no significant stenosis in the thoracic spine. MRI LUMBAR SPINE FINDINGS Segmentation:  Standard. Alignment:  Grade 2 anterolisthesis L5 on S1. Vertebrae: No fracture, evidence of discitis, or suspicious bone lesion. Bilateral L5 pars defects. Conus medullaris: Extends to the L1-L2 level and appears normal. Paraspinal and other soft tissues: Unremarkable. Disc levels: T12-L1: No significant disc protrusion, foraminal stenosis, or canal stenosis. L1-L2: No significant disc protrusion, foraminal stenosis, or canal stenosis.  L2-L3: Disc bulging, ligamentum flavum thickening and mild facet arthropathy. No significant stenosis. L3-L4: Disc bulging, ligamentum flavum thickening facet arthropathy. No significant stenosis. L4-L5: Disc height loss. Disc bulging. Mild left foraminal stenosis. Patent canal and right foramen. L5-S1: Grade 2 anterolisthesis. Bilateral pars defects. Uncovering of the disc. Moderate bilateral foraminal stenosis. Patent canal. IMPRESSION: 1. Bulky  osseous metastatic disease at T9 (as detailed above) with extension inferiorly to involve T10. Extraosseous extension into the canal, paraspinal soft tissues, and T9-T10 foramina. Resulting moderate to severe canal stenosis at T9 and effaced foramina. 2. Additional small metastasis at T8. 3. At L5-S1, grade 2 anterolisthesis with bilateral pars defects and moderate bilateral foraminal stenosis. Electronically Signed   By: Gilmore GORMAN Molt M.D.   On: 03/22/2024 00:12   MR Lumbar Spine W Wo Contrast Result Date: 03/22/2024 CLINICAL DATA:  Metastatic disease evaluation. EXAM: MRI THORACIC AND LUMBAR SPINE WITHOUT AND WITH CONTRAST TECHNIQUE: Multiplanar and multiecho pulse sequences of the thoracic and lumbar spine were obtained without and with intravenous contrast. CONTRAST:  8mL GADAVIST  GADOBUTROL  1 MMOL/ML IV SOLN COMPARISON:  Same day CT of the thoracic spine. FINDINGS: MRI THORACIC SPINE FINDINGS Alignment:  No substantial sagittal subluxation. Vertebrae: Bulky enhancing metastatic lesion at T9 with extraosseous extension posteriorly into the canal with resulting moderate to severe canal stenosis. Enhancing tumor also extends into the adjacent T10 vertebral body, right T9-T10 foramen as well as the right greater than left T9 posterior elements. A small focus of enhancement posteriorly at T8 is also compatible with a metastasis. Cord:  Normal cord signal. Paraspinal and other soft tissues: Extraosseous extension of tumor at T9 is described above but also extends into the right paraspinal soft tissues. Disc levels: At T9, moderate to severe canal stenosis due to extraosseous extension of tumor is detailed above. Tumor also extends into and effaces the T9-T10 foramina bilaterally, greater on the right. Otherwise, no significant stenosis in the thoracic spine. MRI LUMBAR SPINE FINDINGS Segmentation:  Standard. Alignment:  Grade 2 anterolisthesis L5 on S1. Vertebrae: No fracture, evidence of discitis, or  suspicious bone lesion. Bilateral L5 pars defects. Conus medullaris: Extends to the L1-L2 level and appears normal. Paraspinal and other soft tissues: Unremarkable. Disc levels: T12-L1: No significant disc protrusion, foraminal stenosis, or canal stenosis. L1-L2: No significant disc protrusion, foraminal stenosis, or canal stenosis. L2-L3: Disc bulging, ligamentum flavum thickening and mild facet arthropathy. No significant stenosis. L3-L4: Disc bulging, ligamentum flavum thickening facet arthropathy. No significant stenosis. L4-L5: Disc height loss. Disc bulging. Mild left foraminal stenosis. Patent canal and right foramen. L5-S1: Grade 2 anterolisthesis. Bilateral pars defects. Uncovering of the disc. Moderate bilateral foraminal stenosis. Patent canal. IMPRESSION: 1. Bulky osseous metastatic disease at T9 (as detailed above) with extension inferiorly to involve T10. Extraosseous extension into the canal, paraspinal soft tissues, and T9-T10 foramina. Resulting moderate to severe canal stenosis at T9 and effaced foramina. 2. Additional small metastasis at T8. 3. At L5-S1, grade 2 anterolisthesis with bilateral pars defects and moderate bilateral foraminal stenosis. Electronically Signed   By: Gilmore GORMAN Molt M.D.   On: 03/22/2024 00:12   CT Thoracic Spine Wo Contrast Result Date: 03/21/2024 CLINICAL DATA:  History of chronic back pain. Patient bent over to pick something up and felt a pop. Now with extreme mid back pain. 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 EXAM: CT THORACIC SPINE WITHOUT CONTRAST TECHNIQUE: Multidetector CT images of the thoracic were obtained using the standard protocol without intravenous contrast. RADIATION DOSE REDUCTION:  This exam was performed according to the departmental dose-optimization program which includes automated exposure control, adjustment of the mA and/or kV according to patient size and/or use of iterative reconstruction technique.  COMPARISON:  Abdominopelvic CT 01/13/2024.  PET-CT 11/03/2022. FINDINGS: Alignment: Minimal convex right scoliosis. No significant focal angulation or listhesis. Vertebrae: Tracer-avid metastatic disease was demonstrated within the T9 and T10 vertebral bodies on previous PET-CT. The disease within the T9 vertebral body has significantly progressed in the interval with a very large lytic lesion nearly replacing the vertebral body, measuring up to 4.2 x 3.7 cm transverse on axial image 102/9. There is significant posterior extension of tumor into the spinal canal, expected to cause compression of the thoracic cord at this level. Mildly progressive tumor involving the superior endplate of T10. Associated significant soft tissue foraminal narrowing bilaterally at T9-10, worse on the right. No other definite lesions or fractures identified. Paraspinal and other soft tissues: As above, paraspinal tumor at T9 associated with lytic osseous metastatic disease and extending into the T9-10 foramina bilaterally. No definite paraspinal hematoma or other acute paraspinal findings. Aortic and coronary artery atherosclerosis noted. Pulmonary assessment limited by breathing artifact. There is perihilar atelectasis bilaterally. Grossly stable small left lung nodules. Disc levels: Mild spondylosis with mild endplate osteophyte formation. No large disc herniation identified. As above, significant spinal stenosis due to tumor at T9 and T9-10. IMPRESSION: 1. Significant progression of lytic osseous metastatic disease within the T9 vertebral body with significant posterior extension of tumor into the spinal canal, expected to cause compression of the thoracic cord at this level. Recommend spine surgical consultation and further evaluation with MRI of the thoracic spine without and with contrast. 2. Mildly progressive tumor involving the superior endplate of T10. 3. No other definite osseous metastases identified. 4.  Aortic  Atherosclerosis (ICD10-I70.0). 5. Critical Value/emergent results were called by telephone at the time of interpretation on 03/21/2024 at 2:24 pm to provider Lynn LONG, who verbally acknowledged these results. Electronically Signed   By: Elsie Perone M.D.   On: 03/21/2024 14:25    Scheduled Meds:  acetaminophen   1,000 mg Oral Q8H   atorvastatin   40 mg Oral QPM   dexamethasone  (DECADRON ) injection  4 mg Intravenous Q8H   enoxaparin  (LOVENOX ) injection  40 mg Subcutaneous Q24H   ezetimibe   10 mg Oral Daily   gabapentin   100 mg Oral TID   methocarbamol  (ROBAXIN ) injection  500 mg Intravenous Q8H   metoprolol  succinate  50 mg Oral QPM   Continuous Infusions:   LOS: 1 day   Fredia Skeeter, MD [facility]  03/23/2024, 10:05 AM   *Please note that this is a verbal dictation therefore any spelling or grammatical errors are due to the Dragon Medical One system interpretation.  Please page via Amion and do not message via secure chat for urgent patient care matters. Secure chat can be used for non urgent patient care matters.  How to contact the [facility] Attending or Consulting provider 7A - 7P or covering provider during after hours 7P -7A, for this patient?  Check the care team in [facility] and look for a) attending/consulting [facility] provider listed and b) the [facility] team listed. Page or secure chat 7A-7P. Log into [email or web address] and use [facility] universal password to access. If you do not have the password, please contact the hospital operator. Locate the [facility] provider you are looking for under [facility] and page to a number that you can be directly reached. If you still have  difficulty reaching the provider, please page the [facility] (Director on Call) for the Hospitalists listed on amion for assistance.

## 2024-03-23 NOTE — Progress Notes (Signed)
 Patient ID: Lynn Hogan, female   DOB: 1945/09/22, 78 y.o.   MRN: [id number] I had a long discussion with her and her daughter.  Her daughter was on the phone.  I have read the oncology note.  Awaiting MRI of the brain still.  She has good strength in her lower extremities with no pain or numbness tingling or weakness.  She does have significant back pain.  We are recommending T9 and T10 laminectomy to obtain tissue and to decompress the canal, followed by instrumented fusion T7-T12 to stabilize the spine across the tumor.  I hope this will decrease the chance of paraplegia secondary to tumor growth.  We will plan on surgery Friday.  She would like to move forward with the procedure.  I have described the advantages and disadvantages of the surgery versus biopsy.  She has demonstrated understanding.  I tried to answer all of her questions to the best of my ability.  She understands the risks of the surgery include but are not limited to bleeding, infection, CSF leak, spinal cord injury, nerve root injury, numbness, weakness, paralysis, loss of bowel bladder or sexual function, misplaced instrumentation, failure of instrumentation, pseudoarthrosis, lack of relief of symptoms, worsening symptoms, need for further surgery, vascular injury, pseudoarthrosis, and anesthesia risk including DVT pneumonia MI and death.  She agrees to proceed

## 2024-03-23 NOTE — Progress Notes (Signed)
 Obtained the consent form and placed it in pt's chart.   Lawson Radar, RN

## 2024-03-24 ENCOUNTER — Inpatient Hospital Stay (HOSPITAL_COMMUNITY)

## 2024-03-24 DIAGNOSIS — G9589 Other specified diseases of spinal cord: Secondary | ICD-10-CM | POA: Diagnosis not present

## 2024-03-24 LAB — CBC WITH DIFFERENTIAL/PLATELET
Abs Immature Granulocytes: 0.03 K/uL (ref 0.00–0.07)
Basophils Absolute: 0 K/uL (ref 0.0–0.1)
Basophils Relative: 0 %
Eosinophils Absolute: 0 K/uL (ref 0.0–0.5)
Eosinophils Relative: 0 %
HCT: 40.8 % (ref 36.0–46.0)
Hemoglobin: 13.2 g/dL (ref 12.0–15.0)
Immature Granulocytes: 0 %
Lymphocytes Relative: 28 %
Lymphs Abs: 1.9 K/uL (ref 0.7–4.0)
MCH: 31.3 pg (ref 26.0–34.0)
MCHC: 32.4 g/dL (ref 30.0–36.0)
MCV: 96.7 fL (ref 80.0–100.0)
Monocytes Absolute: 0.2 K/uL (ref 0.1–1.0)
Monocytes Relative: 3 %
Neutro Abs: 4.7 K/uL (ref 1.7–7.7)
Neutrophils Relative %: 69 %
Platelets: 352 K/uL (ref 150–400)
RBC: 4.22 MIL/uL (ref 3.87–5.11)
RDW: 13.6 % (ref 11.5–15.5)
WBC: 6.8 K/uL (ref 4.0–10.5)
nRBC: 0 % (ref 0.0–0.2)

## 2024-03-24 LAB — BASIC METABOLIC PANEL WITH GFR
Anion gap: 12 (ref 5–15)
BUN: 14 mg/dL (ref 8–23)
CO2: 21 mmol/L — ABNORMAL LOW (ref 22–32)
Calcium: 9.3 mg/dL (ref 8.9–10.3)
Chloride: 103 mmol/L (ref 98–111)
Creatinine, Ser: 0.77 mg/dL (ref 0.44–1.00)
GFR, Estimated: >60 mL/min (ref >60)
Glucose, Bld: 138 mg/dL — ABNORMAL HIGH (ref 70–99)
Potassium: 4 mmol/L (ref 3.5–5.1)
Sodium: 136 mmol/L (ref 135–145)

## 2024-03-24 LAB — SURGICAL PCR SCREEN
MRSA, PCR: NEGATIVE
Staphylococcus aureus: NEGATIVE

## 2024-03-24 LAB — ABO/RH: ABO/RH(D): A POS

## 2024-03-24 MED ORDER — POLYETHYLENE GLYCOL 3350 17 G PO PACK
17.0000 g | PACK | Freq: Every day | ORAL | Status: DC
  Administered 2024-03-24 – 2024-03-31 (×2): 17 g via ORAL
  Filled 2024-03-24 (×7): qty 1

## 2024-03-24 MED ORDER — BISACODYL 10 MG RE SUPP
10.0000 mg | Freq: Every day | RECTAL | Status: DC | PRN
  Filled 2024-03-24: qty 1

## 2024-03-24 MED ORDER — GADOBUTROL 1 MMOL/ML IV SOLN
8.0000 mL | Freq: Once | INTRAVENOUS | Status: AC | PRN
Start: 2024-03-24 — End: 2024-03-24
  Administered 2024-03-24: 8 mL via INTRAVENOUS

## 2024-03-24 MED ORDER — DOCUSATE SODIUM 100 MG PO CAPS
100.0000 mg | ORAL_CAPSULE | Freq: Two times a day (BID) | ORAL | Status: DC
  Administered 2024-03-24 – 2024-04-08 (×6): 100 mg via ORAL
  Filled 2024-03-24 (×14): qty 1

## 2024-03-24 MED ORDER — SENNOSIDES-DOCUSATE SODIUM 8.6-50 MG PO TABS
2.0000 | ORAL_TABLET | Freq: Every day | ORAL | Status: DC
  Administered 2024-03-24 – 2024-03-29 (×3): 2 via ORAL
  Filled 2024-03-24 (×6): qty 2

## 2024-03-24 MED ORDER — MUPIROCIN 2 % EX OINT
1.0000 | TOPICAL_OINTMENT | Freq: Two times a day (BID) | CUTANEOUS | Status: DC
  Administered 2024-03-24 – 2024-03-25 (×3): 1 via NASAL
  Filled 2024-03-24 (×2): qty 22

## 2024-03-24 NOTE — Progress Notes (Signed)
 PROGRESS NOTE    Lynn Hogan  FMW:[id number] DOB: 1945/07/15 DOA: 03/21/2024 PCP: Joshua Franky Sharper, PA-C   Brief Narrative:  Lynn Hogan is a 78 y.o. female with medical history significant of malignant melanoma of the left forearm status post chemotherapy and surgery years ago, coronary artery disease, hyperlipidemia, essential hypertension, ischemic cardiomyopathy, degenerative disc disease who presented to the ER at [facility] with back pain.  MRI showed metastatic disease at T9 and T10 as well as T8.  Seen by neurosurgery oncology.  Assessment & Plan:   Principal Problem:   Spinal cord mass (HCC) Active Problems:   Hyperlipidemia   CAD (coronary artery disease)   Bone lesion   History of heart failure   Hypertension   Malignant melanoma of left forearm (HCC)  Back Pain due to Metastatic Cancer Osseus Metastatic Disease at T9 with Extension inferiorly to involve T10  T8 Metastasis/moderate to severe canal stenosis. Neurosurgery following - recommending staging with CT C/A/P and MRI brain as well as oncology and radiation oncology.  CT chest abdomen pelvis was completed, no primary source was found.  MRI brain ordered but patient refused x 2 and she does not think she can lay flat on the table due to back pain.  I have offered her pain medication but she still is refusing.  I have canceled MRI per her request.  Seen by oncology, they recommended decompression surgery.  Seen by neurosurgery/Dr. Joshua who also recommended decompression surgery but patient was leaning towards having biopsy first.  She was reevaluated by neurosurgery 03/23/2024 and she has agreed to proceed with the surgery which is scheduled for Friday, 03/25/2024.  Continue gabapentin , dexamethasone  and morphine .   History of Melanoma Malignant melanoma of L forearm 03/13/2022    Hypertension Metoprolol     Dyslipidemia Lipitor , zetia    CAD Noted, no CP  DVT prophylaxis:    Code Status: Full Code   Family Communication:  None present at bedside.  Plan of care discussed with patient in length and he/she verbalized understanding and agreed with it.  Status is: Inpatient Remains inpatient appropriate because: Scheduled for surgical intervention tomorrow.   Estimated body mass index is 33.63 kg/m as calculated from the following:   Height as of this encounter: 5' 1 (1.549 m).   Weight as of this encounter: 80.7 kg.    Nutritional Assessment: Body mass index is 33.63 kg/m.[name] Seen by dietician.  I agree with the assessment and plan as outlined below: Nutrition Status:        . Skin Assessment: I have examined the patient's skin and I agree with the wound assessment as performed by the wound care RN as outlined below:    Consultants:  Neurosurgery and oncology  Procedures:  As above  Antimicrobials:  Anti-infectives (From admission, onward)    None         Subjective: Seen and examined.  She says that she is feeling much better.  She walked to the bathroom without any problems.  She further said that she received answers to all the questions that she had for the surgery and she is in agreement with surgical intervention with neurosurgery tomorrow.  She requested to discontinue telemetry.  This was started on her due to tachycardia and her heart rate has been in sinus rhythm and controlled so I will discontinue telemetry.  Objective: Vitals:   03/23/24 1537 03/23/24 2007 03/24/24 0049 03/24/24 0432  BP: (!) 150/61 (!) 147/69 (!) 155/60 (!) 143/71  Pulse: 67  69 (!) 55 64  Resp: 19 19 19 19   Temp: 98.5 F (36.9 C) 98.4 F (36.9 C) 98 F (36.7 C) (!) 97.4 F (36.3 C)  TempSrc:  Oral Oral Oral  SpO2: 91% 96% 92% 95%  Weight:      Height:        Intake/Output Summary (Last 24 hours) at 03/24/2024 0748 Last data filed at 03/23/2024 1300 Gross per 24 hour  Intake --  Output 600 ml  Net -600 ml   Filed Weights   03/21/24 1158  Weight: 80.7 kg     Examination:  General exam: Appears calm and comfortable  Respiratory system: Clear to auscultation. Respiratory effort normal. Cardiovascular system: S1 & S2 heard, RRR. No JVD, murmurs, rubs, gallops or clicks. No pedal edema. Gastrointestinal system: Abdomen is nondistended, soft and nontender. No organomegaly or masses felt. Normal bowel sounds heard. Central nervous system: Alert and oriented. No focal neurological deficits. Extremities: Symmetric 5 x 5 power. Skin: No rashes, lesions or ulcers Psychiatry: Judgement and insight appear normal. Mood & affect appropriate.    Data Reviewed: I have personally reviewed following labs and imaging studies  CBC: Recent Labs  Lab 03/21/24 1331 03/22/24 0443 03/24/24 0457  WBC 8.6 7.5 6.8  NEUTROABS 6.2  --  4.7  HGB 13.1 12.1 13.2  HCT 39.8 37.2 40.8  MCV 94.8 96.4 96.7  PLT 339 302 352   Basic Metabolic Panel: Recent Labs  Lab 03/21/24 1331 03/22/24 0443 03/24/24 0457  NA 140 138 136  K 3.9 4.0 4.0  CL 104 104 103  CO2 24 25 21*  GLUCOSE 107* 122* 138*  BUN 11 10 14   CREATININE 0.63 0.72 0.77  CALCIUM  9.8 9.1 9.3   GFR: Estimated Creatinine Clearance: 54.9 mL/min (by C-G formula based on SCr of 0.77 mg/dL). Liver Function Tests: Recent Labs  Lab 03/22/24 0443  AST 22  ALT 14  ALKPHOS 53  BILITOT 0.5  PROT 6.4*  ALBUMIN 3.2*   No results for input(s): LIPASE, AMYLASE in the last 168 hours. No results for input(s): AMMONIA in the last 168 hours. Coagulation Profile: No results for input(s): INR, PROTIME in the last 168 hours. Cardiac Enzymes: No results for input(s): CKTOTAL, CKMB, CKMBINDEX, TROPONINI in the last 168 hours. BNP (last 3 results) No results for input(s): PROBNP in the last 8760 hours. HbA1C: No results for input(s): HGBA1C in the last 72 hours. CBG: No results for input(s): GLUCAP in the last 168 hours. Lipid Profile: No results for input(s): CHOL, HDL,  LDLCALC, TRIG, CHOLHDL, LDLDIRECT in the last 72 hours. Thyroid Function Tests: No results for input(s): TSH, T4TOTAL, FREET4, T3FREE, THYROIDAB in the last 72 hours. Anemia Panel: No results for input(s): VITAMINB12, FOLATE, FERRITIN, TIBC, IRON, RETICCTPCT in the last 72 hours. Sepsis Labs: No results for input(s): PROCALCITON, LATICACIDVEN in the last 168 hours.  No results found for this or any previous visit (from the past 240 hours).   Radiology Studies: CT CHEST ABDOMEN PELVIS W CONTRAST Result Date: 03/22/2024 CLINICAL DATA:  Thoracic spine CT and MRI demonstrating metastatic disease centered about T9. Evaluate for primary malignancy. * Tracking Code: BO * EXAM: CT CHEST, ABDOMEN, AND PELVIS WITH CONTRAST TECHNIQUE: Multidetector CT imaging of the chest, abdomen and pelvis was performed following the standard protocol during bolus administration of intravenous contrast. RADIATION DOSE REDUCTION: This exam was performed according to the departmental dose-optimization program which includes automated exposure control, adjustment of the mA and/or kV according to  patient size and/or use of iterative reconstruction technique. CONTRAST:  75mL OMNIPAQUE  IOHEXOL  350 MG/ML SOLN COMPARISON:  Thoracolumbar spine MR 03/21/2024. CT stone study 01/13/2024. FINDINGS: CT CHEST FINDINGS Cardiovascular: Aortic atherosclerosis. Mild cardiomegaly, without pericardial effusion. Three vessel coronary artery calcification. No central pulmonary embolism, on this non-dedicated study. Mediastinum/Nodes: No supraclavicular adenopathy. No mediastinal or hilar adenopathy. Tiny hiatal hernia. Apparent soft tissue fullness within the gastroesophageal junction including on 40/3 is likely due to underdistention and redundancy in the setting of hiatal hernia. Lungs/Pleura: Trace left pleural fluid may be physiologic. Mild motion degradation throughout the chest. Bibasilar scarring. Apparent left  apical pleural-based nodules x2, including an up to 5 mm on 23/5 are favored to be related to minimal pleuroparenchymal scarring. No suspicious dominant pulmonary nodule or mass. Musculoskeletal: Included within the abdomen pelvic section. CT ABDOMEN PELVIS FINDINGS Hepatobiliary: Normal liver. Normal gallbladder, without biliary ductal dilatation. Pancreas: Normal, without mass or ductal dilatation. Spleen: Normal in size, without focal abnormality. Adrenals/Urinary Tract: Normal adrenal glands. Interpolar subcentimeter left renal lesion is too small to characterize but favored to represent a cyst. No follow-up indicated. Normal right kidney. No hydronephrosis. Vascular calcifications anterior to the left proximal ureter. Normal urinary bladder. Stomach/Bowel: Remainder of the stomach is underdistended. Colonic stool burden suggests constipation. Normal terminal ileum. Normal small bowel. Vascular/Lymphatic: Aortic atherosclerosis. No abdominopelvic adenopathy. Reproductive: Hysterectomy.  No adnexal mass. Other: No significant free fluid.  Mild pelvic floor laxity. Musculoskeletal: Bilateral L5 pars defects. Grade 2 L5-S1 anterolisthesis. Osseous metastasis centered about T9 and T10 were better evaluated on dedicated CT and MRI. IMPRESSION: 1. No evidence of primary malignancy or soft tissue metastasis within the chest, abdomen, or pelvis. 2. Minimally motion degraded evaluation of the chest. 3. T9 and T10 osseous metastasis, as before. 4. Tiny hiatal hernia. Apparent mild soft tissue fullness at the gastroesophageal junction is likely due to underdistention and redundancy. If there are symptoms to suggest distal esophageal mass, consider endoscopy. 5. Incidental findings, including: Coronary artery atherosclerosis. Aortic Atherosclerosis (ICD10-I70.0). Electronically Signed   By: Rockey Kilts M.D.   On: 03/22/2024 17:52    Scheduled Meds:  acetaminophen   1,000 mg Oral Q8H   atorvastatin   40 mg Oral QPM    dexamethasone  (DECADRON ) injection  4 mg Intravenous Q8H   ezetimibe   10 mg Oral Daily   gabapentin   100 mg Oral TID   methocarbamol  (ROBAXIN ) injection  500 mg Intravenous Q8H   metoprolol  succinate  50 mg Oral QPM   Continuous Infusions:   LOS: 2 days   Fredia Skeeter, MD [facility]  03/24/2024, 7:48 AM   *Please note that this is a verbal dictation therefore any spelling or grammatical errors are due to the Dragon Medical One system interpretation.  Please page via Amion and do not message via secure chat for urgent patient care matters. Secure chat can be used for non urgent patient care matters.  How to contact the [facility] Attending or Consulting provider 7A - 7P or covering provider during after hours 7P -7A, for this patient?  Check the care team in [facility] and look for a) attending/consulting [facility] provider listed and b) the [facility] team listed. Page or secure chat 7A-7P. Log into [email or web address] and use [place]'s universal password to access. If you do not have the password, please contact the hospital operator. Locate the [facility] provider you are looking for under [facility] and page to a number that you can be directly reached. If you still have difficulty reaching  the provider, please page the [facility] (Director on Call) for the Hospitalists listed on amion for assistance.

## 2024-03-24 NOTE — Progress Notes (Signed)
 Patient ID: Lynn Hogan, female   DOB: 06-Jun-1946, 78 y.o.   MRN: [id number] Unfortunately it looks like she has refused the MRI of the brain twice.  Her pain is better controlled so I recommend that we try again.  It is important to get this MRI.  She also wants to get up and shower and go to the bathroom I think that is okay.  Plan is for surgery tomorrow.  Questions have been encouraged and answered.  N.p.o. after midnight.

## 2024-03-24 NOTE — Progress Notes (Signed)
 IP PROGRESS NOTE  Subjective:   Lynn Hogan reports improvement in back pain.  No leg numbness or weakness.  No difficulty with bowel/bladder control.  She is constipated.  She met with Dr. Joshua yesterday and has decided to proceed with surgery.  She was started on Decadron  yesterday.  Objective: Vital signs in last 24 hours: Blood pressure (!) 143/71, pulse 64, temperature (!) 97.4 F (36.3 C), temperature source Oral, resp. rate 19, height 5' 1 (1.549 m), weight 178 lb (80.7 kg), SpO2 95%.  Intake/Output from previous day: 09/24 0701 - 09/25 0700 In: -  Out: 600 [Urine:600]  Physical Exam:  Cardiac: Regular rate and rhythm Extremities: No leg edema Neurologic: Leg and foot strength is intact bilaterally Skin: Left forearm scar without evidence of recurrent tumor  Lab Results: Recent Labs    03/22/24 0443 03/24/24 0457  WBC 7.5 6.8  HGB 12.1 13.2  HCT 37.2 40.8  PLT 302 352    BMET Recent Labs    03/22/24 0443 03/24/24 0457  NA 138 136  K 4.0 4.0  CL 104 103  CO2 25 21*  GLUCOSE 122* 138*  BUN 10 14  CREATININE 0.72 0.77  CALCIUM  9.1 9.3    No results found for: CEA1, CEA, CAN199, CA125  Studies/Results: CT CHEST ABDOMEN PELVIS W CONTRAST Result Date: 03/22/2024 CLINICAL DATA:  Thoracic spine CT and MRI demonstrating metastatic disease centered about T9. Evaluate for primary malignancy. * Tracking Code: BO * EXAM: CT CHEST, ABDOMEN, AND PELVIS WITH CONTRAST TECHNIQUE: Multidetector CT imaging of the chest, abdomen and pelvis was performed following the standard protocol during bolus administration of intravenous contrast. RADIATION DOSE REDUCTION: This exam was performed according to the departmental dose-optimization program which includes automated exposure control, adjustment of the mA and/or kV according to patient size and/or use of iterative reconstruction technique. CONTRAST:  75mL OMNIPAQUE  IOHEXOL  350 MG/ML SOLN COMPARISON:  Thoracolumbar spine  MR 03/21/2024. CT stone study 01/13/2024. FINDINGS: CT CHEST FINDINGS Cardiovascular: Aortic atherosclerosis. Mild cardiomegaly, without pericardial effusion. Three vessel coronary artery calcification. No central pulmonary embolism, on this non-dedicated study. Mediastinum/Nodes: No supraclavicular adenopathy. No mediastinal or hilar adenopathy. Tiny hiatal hernia. Apparent soft tissue fullness within the gastroesophageal junction including on 40/3 is likely due to underdistention and redundancy in the setting of hiatal hernia. Lungs/Pleura: Trace left pleural fluid may be physiologic. Mild motion degradation throughout the chest. Bibasilar scarring. Apparent left apical pleural-based nodules x2, including an up to 5 mm on 23/5 are favored to be related to minimal pleuroparenchymal scarring. No suspicious dominant pulmonary nodule or mass. Musculoskeletal: Included within the abdomen pelvic section. CT ABDOMEN PELVIS FINDINGS Hepatobiliary: Normal liver. Normal gallbladder, without biliary ductal dilatation. Pancreas: Normal, without mass or ductal dilatation. Spleen: Normal in size, without focal abnormality. Adrenals/Urinary Tract: Normal adrenal glands. Interpolar subcentimeter left renal lesion is too small to characterize but favored to represent a cyst. No follow-up indicated. Normal right kidney. No hydronephrosis. Vascular calcifications anterior to the left proximal ureter. Normal urinary bladder. Stomach/Bowel: Remainder of the stomach is underdistended. Colonic stool burden suggests constipation. Normal terminal ileum. Normal small bowel. Vascular/Lymphatic: Aortic atherosclerosis. No abdominopelvic adenopathy. Reproductive: Hysterectomy.  No adnexal mass. Other: No significant free fluid.  Mild pelvic floor laxity. Musculoskeletal: Bilateral L5 pars defects. Grade 2 L5-S1 anterolisthesis. Osseous metastasis centered about T9 and T10 were better evaluated on dedicated CT and MRI. IMPRESSION: 1. No  evidence of primary malignancy or soft tissue metastasis within the chest, abdomen, or pelvis. 2.  Minimally motion degraded evaluation of the chest. 3. T9 and T10 osseous metastasis, as before. 4. Tiny hiatal hernia. Apparent mild soft tissue fullness at the gastroesophageal junction is likely due to underdistention and redundancy. If there are symptoms to suggest distal esophageal mass, consider endoscopy. 5. Incidental findings, including: Coronary artery atherosclerosis. Aortic Atherosclerosis (ICD10-I70.0). Electronically Signed   By: Rockey Kilts M.D.   On: 03/22/2024 17:52    Medications: I have reviewed the patient's current medications.  Assessment/Plan: Melanoma 03/13/2022 left forearm malignant melanoma, nodular type, ulcerated, 2.2 x 1.4 x 0.8 cm, Breslow thickness 14 mm, Clark level V, stage IIc (pT4b pN0 ( 03/28/2022 needle biopsy left axillary lymph node-lymphoid tissue, no evidence of malignancy 04/24/2022 left forearm reexcision-no evidence of residual melanoma, 2 left axillary sentinel nodes negative for malignancy 11/23/2022 PET: Tracer avid lytic bone lesions at T9 and T10 03/21/2024 CT thoracic spine: Progression of lytic osseous metastatic disease at T9 with significant posterior extension into the spinal canal, mild progressive tumor at the superior endplate of T10 03/21/2024 MRI thoracic and lumbar spine: Metastatic disease at T9 with extension to T10, extraosseous extension into the spinal canal and paraspinal soft tissues, moderate to severe canal stenosis at T9, small T8 metastasis 03/22/2024 CTs: Left apical pleural-based nodules favored scarring, no evidence of a primary malignancy or soft tissue metastasis, T9 and T10 metastases   2.  Back pain secondary to a destructive mass at T9 3.  Hyperlipidemia 4.  History of coronary artery disease, NSTEMI 2020, STEMI 2020 5.  Hypertension 6.  History of CHF 7.  Basal cell carcinomas at the left ankle and right posterior arm excised  03/13/2022   Ms. Aday has a history of melanoma.  She presents with back pain and apparent metastatic disease involving the thoracic spine with canal stenosis.  The spine findings are likely related to metastatic melanoma, but metastatic disease from another tumor site or a hepatosplenic malignancy are possible.  She met with Dr. Joshua yesterday and has decided to proceed with decompression surgery.  Recommendations: Follow-up brain MRI scheduled for today Surgical decompression of the thoracic spine per Dr. Joshua Taper Decadron  following surgery as recommended by Dr. Joshua I will check on her 03/28/2024 if she remains in the hospital, outpatient follow-up will be scheduled at the Cancer center, please call oncology as needed   LOS: 2 days   Arley Hof, MD   03/24/2024, 8:02 AM

## 2024-03-25 ENCOUNTER — Inpatient Hospital Stay (HOSPITAL_COMMUNITY): Admitting: Anesthesiology

## 2024-03-25 ENCOUNTER — Encounter (HOSPITAL_COMMUNITY)
Admission: EM | Disposition: A | Discharge status: Home / Self Care | Payer: Self-pay | Source: Home / Self Care | Attending: Family Medicine

## 2024-03-25 ENCOUNTER — Encounter (HOSPITAL_COMMUNITY): Payer: Self-pay | Admitting: Internal Medicine

## 2024-03-25 ENCOUNTER — Other Ambulatory Visit: Payer: Self-pay

## 2024-03-25 ENCOUNTER — Inpatient Hospital Stay (HOSPITAL_COMMUNITY)

## 2024-03-25 DIAGNOSIS — E785 Hyperlipidemia, unspecified: Secondary | ICD-10-CM | POA: Diagnosis not present

## 2024-03-25 DIAGNOSIS — I10 Essential (primary) hypertension: Secondary | ICD-10-CM | POA: Diagnosis not present

## 2024-03-25 DIAGNOSIS — D492 Neoplasm of unspecified behavior of bone, soft tissue, and skin: Secondary | ICD-10-CM

## 2024-03-25 DIAGNOSIS — Z981 Arthrodesis status: Secondary | ICD-10-CM

## 2024-03-25 DIAGNOSIS — I2510 Atherosclerotic heart disease of native coronary artery without angina pectoris: Secondary | ICD-10-CM

## 2024-03-25 DIAGNOSIS — G9589 Other specified diseases of spinal cord: Secondary | ICD-10-CM | POA: Diagnosis not present

## 2024-03-25 LAB — PREPARE RBC (CROSSMATCH)

## 2024-03-25 SURGERY — POSTERIOR SPINAL FUSION, 6 LEVELS
Anesthesia: General

## 2024-03-25 MED ORDER — METHOCARBAMOL 1000 MG/10ML IJ SOLN
500.0000 mg | Freq: Four times a day (QID) | INTRAMUSCULAR | Status: DC | PRN
  Administered 2024-04-01: 500 mg via INTRAVENOUS
  Filled 2024-03-25: qty 10

## 2024-03-25 MED ORDER — HYDROMORPHONE HCL 1 MG/ML IJ SOLN
INTRAMUSCULAR | Status: AC
  Filled 2024-03-25: qty 0.5

## 2024-03-25 MED ORDER — LIDOCAINE 2% (20 MG/ML) 5 ML SYRINGE
INTRAMUSCULAR | Status: DC | PRN
  Administered 2024-03-25: 100 mg via INTRAVENOUS

## 2024-03-25 MED ORDER — LIDOCAINE 2% (20 MG/ML) 5 ML SYRINGE
INTRAMUSCULAR | Status: AC
  Filled 2024-03-25: qty 5

## 2024-03-25 MED ORDER — SODIUM CHLORIDE 0.9 % IV SOLN
10.0000 mL/h | Freq: Once | INTRAVENOUS | Status: DC
Start: 2024-03-25 — End: 2024-03-25

## 2024-03-25 MED ORDER — CEFAZOLIN SODIUM-DEXTROSE 2-3 GM-%(50ML) IV SOLR
INTRAVENOUS | Status: DC | PRN
  Administered 2024-03-25: 2 g via INTRAVENOUS

## 2024-03-25 MED ORDER — DEXAMETHASONE SODIUM PHOSPHATE 10 MG/ML IJ SOLN
INTRAMUSCULAR | Status: AC
  Filled 2024-03-25: qty 1

## 2024-03-25 MED ORDER — ROCURONIUM BROMIDE 10 MG/ML (PF) SYRINGE
PREFILLED_SYRINGE | INTRAVENOUS | Status: AC
  Filled 2024-03-25: qty 10

## 2024-03-25 MED ORDER — ONDANSETRON HCL 4 MG/2ML IJ SOLN
INTRAMUSCULAR | Status: AC
  Filled 2024-03-25: qty 2

## 2024-03-25 MED ORDER — PROPOFOL 10 MG/ML IV BOLUS
INTRAVENOUS | Status: AC
  Filled 2024-03-25: qty 20

## 2024-03-25 MED ORDER — ONDANSETRON HCL 4 MG PO TABS
4.0000 mg | ORAL_TABLET | Freq: Four times a day (QID) | ORAL | Status: DC | PRN
  Administered 2024-03-31 – 2024-04-04 (×9): 4 mg via ORAL
  Filled 2024-03-25 (×9): qty 1

## 2024-03-25 MED ORDER — ONDANSETRON HCL 4 MG/2ML IJ SOLN
INTRAMUSCULAR | Status: DC | PRN
  Administered 2024-03-25: 4 mg via INTRAVENOUS

## 2024-03-25 MED ORDER — METHOCARBAMOL 1000 MG/10ML IJ SOLN
500.0000 mg | Freq: Once | INTRAMUSCULAR | Status: AC
  Administered 2024-03-25: 500 mg via INTRAVENOUS

## 2024-03-25 MED ORDER — CEFAZOLIN SODIUM-DEXTROSE 2-4 GM/100ML-% IV SOLN
2.0000 g | Freq: Three times a day (TID) | INTRAVENOUS | Status: AC
  Administered 2024-03-26 (×2): 2 g via INTRAVENOUS
  Filled 2024-03-25 (×2): qty 100

## 2024-03-25 MED ORDER — METHOCARBAMOL 1000 MG/10ML IJ SOLN
INTRAMUSCULAR | Status: AC
  Filled 2024-03-25: qty 10

## 2024-03-25 MED ORDER — THROMBIN 5000 UNITS EX SOLR
OROMUCOSAL | Status: DC | PRN
  Administered 2024-03-25: 5 mL via TOPICAL

## 2024-03-25 MED ORDER — FENTANYL CITRATE (PF) 250 MCG/5ML IJ SOLN
INTRAMUSCULAR | Status: AC
  Filled 2024-03-25: qty 5

## 2024-03-25 MED ORDER — ACETAMINOPHEN 500 MG PO TABS
1000.0000 mg | ORAL_TABLET | Freq: Four times a day (QID) | ORAL | Status: AC
  Administered 2024-03-26 (×4): 1000 mg via ORAL
  Filled 2024-03-25 (×4): qty 2

## 2024-03-25 MED ORDER — MENTHOL 3 MG MT LOZG: 1.0000 | LOZENGE | OROMUCOSAL | Status: DC | PRN

## 2024-03-25 MED ORDER — SODIUM CHLORIDE 0.9% FLUSH
3.0000 mL | Freq: Two times a day (BID) | INTRAVENOUS | Status: DC
  Administered 2024-03-25 – 2024-04-09 (×27): 3 mL via INTRAVENOUS

## 2024-03-25 MED ORDER — ROCURONIUM BROMIDE 10 MG/ML (PF) SYRINGE
PREFILLED_SYRINGE | INTRAVENOUS | Status: DC | PRN
  Administered 2024-03-25 (×2): 20 mg via INTRAVENOUS
  Administered 2024-03-25: 60 mg via INTRAVENOUS
  Administered 2024-03-25: 40 mg via INTRAVENOUS

## 2024-03-25 MED ORDER — DEXAMETHASONE 4 MG PO TABS
4.0000 mg | ORAL_TABLET | Freq: Four times a day (QID) | ORAL | Status: DC
  Filled 2024-03-25: qty 1

## 2024-03-25 MED ORDER — PHENOL 1.4 % MT LIQD: 1.0000 | OROMUCOSAL | Status: DC | PRN

## 2024-03-25 MED ORDER — SURGIRINSE WOUND IRRIGATION SYSTEM - OPTIME
TOPICAL | Status: DC | PRN
  Administered 2024-03-25: 450 mL via TOPICAL

## 2024-03-25 MED ORDER — LACTATED RINGERS IV SOLN
INTRAVENOUS | Status: DC
Start: 2024-03-25 — End: 2024-03-25

## 2024-03-25 MED ORDER — PROPOFOL 10 MG/ML IV BOLUS
INTRAVENOUS | Status: DC | PRN
  Administered 2024-03-25: 50 mg via INTRAVENOUS
  Administered 2024-03-25: 150 mg via INTRAVENOUS

## 2024-03-25 MED ORDER — ORAL CARE MOUTH RINSE: 15.0000 mL | Freq: Once | OROMUCOSAL | Status: AC

## 2024-03-25 MED ORDER — FENTANYL CITRATE (PF) 250 MCG/5ML IJ SOLN
INTRAMUSCULAR | Status: DC | PRN
  Administered 2024-03-25 (×4): 50 ug via INTRAVENOUS
  Administered 2024-03-25: 100 ug via INTRAVENOUS
  Administered 2024-03-25 (×2): 50 ug via INTRAVENOUS
  Administered 2024-03-25: 100 ug via INTRAVENOUS

## 2024-03-25 MED ORDER — CHLORHEXIDINE GLUCONATE 0.12 % MT SOLN: 15.0000 mL | Freq: Once | OROMUCOSAL | Status: AC

## 2024-03-25 MED ORDER — THROMBIN 20000 UNITS EX SOLR
CUTANEOUS | Status: DC | PRN
  Administered 2024-03-25: 20 mL via TOPICAL

## 2024-03-25 MED ORDER — SODIUM CHLORIDE 0.9% FLUSH: 3.0000 mL | INTRAVENOUS | Status: DC | PRN

## 2024-03-25 MED ORDER — BUPIVACAINE HCL (PF) 0.25 % IJ SOLN
INTRAMUSCULAR | Status: DC | PRN
  Administered 2024-03-25: 10 mL

## 2024-03-25 MED ORDER — ONDANSETRON HCL 4 MG/2ML IJ SOLN: 4.0000 mg | Freq: Once | INTRAMUSCULAR | Status: DC | PRN

## 2024-03-25 MED ORDER — METHOCARBAMOL 500 MG PO TABS
500.0000 mg | ORAL_TABLET | Freq: Four times a day (QID) | ORAL | Status: DC | PRN
  Administered 2024-04-02 – 2024-04-09 (×23): 500 mg via ORAL
  Filled 2024-03-25 (×26): qty 1

## 2024-03-25 MED ORDER — ACETAMINOPHEN 10 MG/ML IV SOLN
1000.0000 mg | Freq: Once | INTRAVENOUS | Status: DC | PRN
  Administered 2024-03-25: 1000 mg via INTRAVENOUS

## 2024-03-25 MED ORDER — LACTATED RINGERS IV SOLN: INTRAVENOUS | Status: DC | PRN

## 2024-03-25 MED ORDER — PHENYLEPHRINE HCL-NACL 20-0.9 MG/250ML-% IV SOLN
INTRAVENOUS | Status: DC | PRN
  Administered 2024-03-25: 20 ug/min via INTRAVENOUS

## 2024-03-25 MED ORDER — THROMBIN 20000 UNITS EX SOLR
CUTANEOUS | Status: AC
  Filled 2024-03-25: qty 20000

## 2024-03-25 MED ORDER — DEXAMETHASONE SODIUM PHOSPHATE 4 MG/ML IJ SOLN
4.0000 mg | Freq: Four times a day (QID) | INTRAMUSCULAR | Status: DC
  Administered 2024-03-26 – 2024-03-29 (×14): 4 mg via INTRAVENOUS
  Filled 2024-03-25 (×14): qty 1

## 2024-03-25 MED ORDER — ONDANSETRON HCL 4 MG/2ML IJ SOLN
4.0000 mg | Freq: Four times a day (QID) | INTRAMUSCULAR | Status: DC | PRN
  Administered 2024-03-26 – 2024-03-31 (×3): 4 mg via INTRAVENOUS
  Filled 2024-03-25 (×3): qty 2

## 2024-03-25 MED ORDER — CHLORHEXIDINE GLUCONATE 0.12 % MT SOLN
OROMUCOSAL | Status: AC
  Administered 2024-03-25: 15 mL via OROMUCOSAL
  Filled 2024-03-25: qty 15

## 2024-03-25 MED ORDER — SENNA 8.6 MG PO TABS
1.0000 | ORAL_TABLET | Freq: Two times a day (BID) | ORAL | Status: DC
  Administered 2024-03-27 – 2024-03-31 (×3): 8.6 mg via ORAL
  Filled 2024-03-25 (×12): qty 1

## 2024-03-25 MED ORDER — FENTANYL CITRATE (PF) 100 MCG/2ML IJ SOLN
25.0000 ug | INTRAMUSCULAR | Status: DC | PRN
  Administered 2024-03-25 (×3): 50 ug via INTRAVENOUS

## 2024-03-25 MED ORDER — BUPIVACAINE HCL (PF) 0.25 % IJ SOLN
INTRAMUSCULAR | Status: AC
  Filled 2024-03-25: qty 30

## 2024-03-25 MED ORDER — DEXAMETHASONE SODIUM PHOSPHATE 10 MG/ML IJ SOLN
INTRAMUSCULAR | Status: DC | PRN
  Administered 2024-03-25: 10 mg via INTRAVENOUS

## 2024-03-25 MED ORDER — OXYCODONE HCL 5 MG PO TABS: 5.0000 mg | ORAL_TABLET | Freq: Once | ORAL | Status: DC | PRN

## 2024-03-25 MED ORDER — DROPERIDOL 2.5 MG/ML IJ SOLN: 0.6250 mg | Freq: Once | INTRAMUSCULAR | Status: DC | PRN

## 2024-03-25 MED ORDER — OXYCODONE HCL 5 MG/5ML PO SOLN: 5.0000 mg | Freq: Once | ORAL | Status: DC | PRN

## 2024-03-25 MED ORDER — PHENYLEPHRINE 80 MCG/ML (10ML) SYRINGE FOR IV PUSH (FOR BLOOD PRESSURE SUPPORT)
PREFILLED_SYRINGE | INTRAVENOUS | Status: DC | PRN
  Administered 2024-03-25 (×2): 160 ug via INTRAVENOUS

## 2024-03-25 MED ORDER — POTASSIUM CHLORIDE IN NACL 20-0.9 MEQ/L-% IV SOLN
INTRAVENOUS | Status: DC
  Filled 2024-03-25 (×6): qty 1000

## 2024-03-25 MED ORDER — SODIUM CHLORIDE 0.9 % IV SOLN: 250.0000 mL | INTRAVENOUS | Status: AC

## 2024-03-25 MED ORDER — THROMBIN 5000 UNITS EX KIT
PACK | CUTANEOUS | Status: AC
  Filled 2024-03-25: qty 1

## 2024-03-25 MED ORDER — 0.9 % SODIUM CHLORIDE (POUR BTL) OPTIME
TOPICAL | Status: DC | PRN
  Administered 2024-03-25: 1000 mL

## 2024-03-25 MED ORDER — SUGAMMADEX SODIUM 200 MG/2ML IV SOLN
INTRAVENOUS | Status: DC | PRN
  Administered 2024-03-25: 320 mg via INTRAVENOUS

## 2024-03-25 MED ORDER — FENTANYL CITRATE (PF) 100 MCG/2ML IJ SOLN
INTRAMUSCULAR | Status: AC
  Filled 2024-03-25: qty 2

## 2024-03-25 MED ORDER — HYDROMORPHONE HCL 1 MG/ML IJ SOLN
INTRAMUSCULAR | Status: DC | PRN
  Administered 2024-03-25: .5 mg via INTRAVENOUS

## 2024-03-25 MED ORDER — MORPHINE SULFATE (PF) 2 MG/ML IV SOLN
2.0000 mg | INTRAVENOUS | Status: DC | PRN
  Administered 2024-03-25 – 2024-03-30 (×27): 2 mg via INTRAVENOUS
  Filled 2024-03-25 (×27): qty 1

## 2024-03-25 SURGICAL SUPPLY — 57 items
BAG COUNTER SPONGE SURGICOUNT (BAG) ×1 IMPLANT
BASKET BONE COLLECTION (BASKET) ×1 IMPLANT
BENZOIN TINCTURE PRP APPL 2/3 (GAUZE/BANDAGES/DRESSINGS) ×1 IMPLANT
BLADE BONE MILL MEDIUM (MISCELLANEOUS) ×1 IMPLANT
BLADE CLIPPER SURG (BLADE) IMPLANT
BLADE MILL BN FN STRL DISP (MISCELLANEOUS) IMPLANT
BUR CARBIDE MATCH 3.0 (BURR) ×1 IMPLANT
CANISTER SUCTION 3000ML PPV (SUCTIONS) ×1 IMPLANT
CNTNR URN SCR LID CUP LEK RST (MISCELLANEOUS) ×1 IMPLANT
COVER BACK TABLE 60X90IN (DRAPES) ×1 IMPLANT
DERMABOND ADVANCED .7 DNX12 (GAUZE/BANDAGES/DRESSINGS) ×1 IMPLANT
DRAPE C-ARM 42X72 X-RAY (DRAPES) ×2 IMPLANT
DRAPE C-ARMOR (DRAPES) ×1 IMPLANT
DRAPE LAPAROTOMY 100X72X124 (DRAPES) ×1 IMPLANT
DRAPE SURG 17X23 STRL (DRAPES) ×1 IMPLANT
DRSG AQUACEL AG ADV 3.5X 6 (GAUZE/BANDAGES/DRESSINGS) ×1 IMPLANT
DRSG AQUACEL AG ADV 3.5X14 (GAUZE/BANDAGES/DRESSINGS) IMPLANT
DURAPREP 26ML APPLICATOR (WOUND CARE) ×1 IMPLANT
ELECTRODE REM PT RTRN 9FT ADLT (ELECTROSURGICAL) ×1 IMPLANT
EVACUATOR 1/8 PVC DRAIN (DRAIN) ×1 IMPLANT
GAUZE 4X4 16PLY ~~LOC~~+RFID DBL (SPONGE) IMPLANT
GLOVE BIO SURGEON STRL SZ7 (GLOVE) ×1 IMPLANT
GLOVE BIO SURGEON STRL SZ8 (GLOVE) ×2 IMPLANT
GLOVE BIOGEL PI IND STRL 7.0 (GLOVE) ×1 IMPLANT
GLOVE BIOGEL PI IND STRL 7.5 (GLOVE) IMPLANT
GOWN STRL REUS W/ TWL LRG LVL3 (GOWN DISPOSABLE) ×1 IMPLANT
GOWN STRL REUS W/ TWL XL LVL3 (GOWN DISPOSABLE) ×1 IMPLANT
GOWN STRL REUS W/TWL 2XL LVL3 (GOWN DISPOSABLE) IMPLANT
GRAFT BONE MAGNIFUSE 1X10CM (Bone Implant) IMPLANT
HEMOSTAT POWDER KIT SURGIFOAM (HEMOSTASIS) ×1 IMPLANT
KIT BASIN OR (CUSTOM PROCEDURE TRAY) ×1 IMPLANT
KIT TURNOVER KIT B (KITS) ×1 IMPLANT
MILL BONE PREP (MISCELLANEOUS) ×1 IMPLANT
NDL HYPO 25X1 1.5 SAFETY (NEEDLE) ×1 IMPLANT
NEEDLE HYPO 25X1 1.5 SAFETY (NEEDLE) ×1 IMPLANT
PACK LAMINECTOMY NEURO (CUSTOM PROCEDURE TRAY) ×1 IMPLANT
PAD ARMBOARD POSITIONER FOAM (MISCELLANEOUS) ×3 IMPLANT
POWDER MORCELSS FINE 2000MG (Miscellaneous) IMPLANT
ROD STRT INV 5.5X400 (Rod) IMPLANT
SCREW KODIAK 5.5X45 (Screw) IMPLANT
SCREW KODIAK 5.5X50 (Screw) IMPLANT
SET SCREW SPNE (Screw) IMPLANT
SOLN 0.9% NACL 1000 ML (IV SOLUTION) ×1 IMPLANT
SOLN 0.9% NACL POUR BTL 1000ML (IV SOLUTION) ×1 IMPLANT
SOLN STERILE WATER 1000 ML (IV SOLUTION) ×1 IMPLANT
SOLN STERILE WATER BTL 1000 ML (IV SOLUTION) ×1 IMPLANT
SOLUTION IRRIG SURGIPHOR (IV SOLUTION) ×1 IMPLANT
SPONGE SURGIFOAM ABS GEL 100 (HEMOSTASIS) ×1 IMPLANT
SPONGE T-LAP 4X18 ~~LOC~~+RFID (SPONGE) IMPLANT
STRIP CLOSURE SKIN 1/2X4 (GAUZE/BANDAGES/DRESSINGS) ×2 IMPLANT
SUT VIC AB 0 CT1 18XCR BRD8 (SUTURE) ×1 IMPLANT
SUT VIC AB 2-0 CP2 18 (SUTURE) ×1 IMPLANT
SUT VIC AB 3-0 SH 8-18 (SUTURE) ×2 IMPLANT
SYR CONTROL 10ML LL (SYRINGE) ×1 IMPLANT
TOWEL GREEN STERILE (TOWEL DISPOSABLE) ×1 IMPLANT
TOWEL GREEN STERILE FF (TOWEL DISPOSABLE) ×1 IMPLANT
TRAY FOLEY MTR SLVR 16FR STAT (SET/KITS/TRAYS/PACK) ×1 IMPLANT

## 2024-03-25 NOTE — Plan of Care (Signed)

## 2024-03-25 NOTE — Transfer of Care (Signed)
 Immediate Anesthesia Transfer of Care Note  Patient: Lynn Hogan  Procedure(s) Performed: THORACIC NINE -THORACIC TEN LAMINECTOMY FOR TUMOR, INSTRUMENTED FUSION THORACIC SEVEN-THORACIC TWELVE  Patient Location: PACU  Anesthesia Type:General  Level of Consciousness: awake and patient cooperative  Airway & Oxygen Therapy: Patient Spontanous Breathing and Patient connected to nasal cannula oxygen  Post-op Assessment: Report given to RN and Post -op Vital signs reviewed and stable  Post vital signs: Reviewed and stable  Last Vitals:  Vitals Value Taken Time  BP 114/75 03/25/24 18:40  Temp    Pulse 79 03/25/24 18:44  Resp 17 03/25/24 18:44  SpO2 89 % 03/25/24 18:44  Vitals shown include unfiled device data.  Last Pain:  Vitals:   03/25/24 1255  TempSrc:   PainSc: 5       Patients Stated Pain Goal: 0 (03/25/24 0900)  Complications: No notable events documented.

## 2024-03-25 NOTE — Anesthesia Preprocedure Evaluation (Addendum)
 Anesthesia Evaluation  Patient identified by MRN, date of birth, ID band Patient awake    Reviewed: Allergy & Precautions, NPO status , Patient's Chart, lab work & pertinent test results  History of Anesthesia Complications Negative for: history of anesthetic complications  Airway Mallampati: I       Dental no notable dental hx. (+) Dental Advisory Given   Pulmonary    breath sounds clear to auscultation       Cardiovascular hypertension, + CAD, + Past MI and + Cardiac Stents   Rhythm:Regular Rate:Normal   1. The left ventricle has normal systolic function with an ejection  fraction of 60-65%. The cavity size was normal. There is mildly increased  left ventricular wall thickness. Left ventricular diastolic Doppler  parameters are consistent with impaired  relaxation.   2. The right ventricle has normal systolic function. The cavity was  normal.   3. Left atrial size was mildly dilated.   4. Right atrial size was mildly dilated.   5. The mitral valve is grossly normal. There is mild mitral annular  calcification present.   6. The tricuspid valve is grossly normal.   7. The aortic valve is tricuspid. Mild thickening of the aortic valve. No  stenosis of the aortic valve.   8. Normal LV systolic function; mild LVH; mild diastolic dysfunction;  mild biatrial enlargement; mild MR and TR.      Neuro/Psych Spinal Cord Mass    GI/Hepatic   Endo/Other  neg diabetes    Renal/GU      Musculoskeletal   Abdominal   Peds  Hematology   Anesthesia Other Findings   Reproductive/Obstetrics                              Anesthesia Physical Anesthesia Plan  ASA: 3  Anesthesia Plan: General   Post-op Pain Management:    Induction: Intravenous  PONV Risk Score and Plan: Ondansetron  and Dexamethasone   Airway Management Planned: Oral ETT  Additional Equipment: Arterial line  Intra-op Plan:    Post-operative Plan: Extubation in OR and Possible Post-op intubation/ventilation  Informed Consent:      Dental advisory given  Plan Discussed with:   Anesthesia Plan Comments:          Anesthesia Quick Evaluation

## 2024-03-25 NOTE — Anesthesia Procedure Notes (Signed)
 Procedure Name: Intubation Date/Time: 03/25/2024 2:40 PM  Performed by: Genny Gun, CRNAPre-anesthesia Checklist: Patient identified, Emergency Drugs available, Suction available, Patient being monitored and Timeout performed Patient Re-evaluated:Patient Re-evaluated prior to induction Oxygen Delivery Method: Circle system utilized Preoxygenation: Pre-oxygenation with 100% oxygen Induction Type: IV induction Ventilation: Mask ventilation without difficulty Laryngoscope Size: Glidescope and 3 Grade View: Grade I Tube type: Oral Tube size: 7.0 mm Number of attempts: 1 Placement Confirmation: ETT inserted through vocal cords under direct vision, positive ETCO2 and breath sounds checked- equal and bilateral Secured at: 22 cm Tube secured with: Tape Dental Injury: Teeth and Oropharynx as per pre-operative assessment  Comments: DL with MAC 3 unsuccessful  changed to Glidescope #3 and 7.0 ETT placed easily

## 2024-03-25 NOTE — TOC Progression Note (Signed)
 Transition of Care [facility]) - Progression Note    Patient Details  Name: Lynn Hogan MRN: [id number] Date of Birth: 23-Mar-1946  [facility] CM/SW Contact  Andrez JULIANNA George, RN Phone Number: 03/25/2024, 9:58 AM  Clinical Narrative:     Pt to have: T9 and T10 laminectomy to obtain tissue and to decompress the canal, followed by instrumented fusion T7-T12 to stabilize the spine across the tumor today.  IP Care management following.  Expected Discharge Plan:  (TBD) Barriers to Discharge: Continued Medical Work up               Expected Discharge Plan and Services   Discharge Planning Services: CM Consult   Living arrangements for the past 2 months: Single Family Home                                       Social Drivers of Health (SDOH) Interventions SDOH Screenings   Food Insecurity: No Food Insecurity (03/22/2024)  Housing: Low Risk  (03/22/2024)  Transportation Needs: No Transportation Needs (03/22/2024)  Utilities: Not At Risk (03/22/2024)  Financial Resource Strain: Low Risk  (08/01/2018)  Physical Activity: Sufficiently Active (08/01/2018)  Social Connections: Socially Isolated (03/22/2024)  Stress: No Stress Concern Present (08/01/2018)  Tobacco Use: Low Risk  (03/22/2024)    Readmission Risk Interventions     No data to display

## 2024-03-25 NOTE — Progress Notes (Signed)
 PROGRESS NOTE    Lynn Hogan  FMW:[id number] DOB: 04-11-1946 DOA: 03/21/2024 PCP: Joshua Franky Sharper, PA-C   Brief Narrative:  Lynn Hogan is a 78 y.o. female with medical history significant of malignant melanoma of the left forearm status post chemotherapy and surgery years ago, coronary artery disease, hyperlipidemia, essential hypertension, ischemic cardiomyopathy, degenerative disc disease who presented to the ER at [facility] with back pain.  MRI showed metastatic disease at T9 and T10 as well as T8.  Seen by neurosurgery oncology.  Assessment & Plan:   Principal Problem:   Spinal cord mass (HCC) Active Problems:   Hyperlipidemia   CAD (coronary artery disease)   Bone lesion   History of heart failure   Hypertension   Malignant melanoma of left forearm (HCC)  Back Pain due to Metastatic Cancer: Osseus Metastatic Disease at T9 with Extension inferiorly to involve T10  T8 Metastasis/moderate to severe canal stenosis. Neurosurgery following - recommending staging with CT C/A/P and MRI brain as well as oncology and radiation oncology.  CT chest abdomen pelvis was completed, no primary source was found.  MRI brain ordered but patient refused x 2 will he agreed and this was completed on 03/24/2024 which did not show any metastatic disease in the brain. Seen by oncology, they recommended decompression surgery for which patient refused initially but eventually agreed and she is scheduled to have the surgery today. Continue gabapentin , dexamethasone  and morphine .   History of Melanoma Malignant melanoma of L forearm 03/13/2022    Hypertension Metoprolol     Dyslipidemia Lipitor , zetia    CAD Noted, no CP  DVT prophylaxis: Place and maintain sequential compression device Start: 03/24/24 0750   Code Status: Full Code  Family Communication:  None present at bedside.  Plan of care discussed with patient in length and he/she verbalized understanding and agreed with  it.  Status is: Inpatient Remains inpatient appropriate because: Scheduled for surgical intervention today.   Estimated body mass index is 33.63 kg/m as calculated from the following:   Height as of this encounter: 5' 1 (1.549 m).   Weight as of this encounter: 80.7 kg.    Nutritional Assessment: Body mass index is 33.63 kg/m.[name] Seen by dietician.  I agree with the assessment and plan as outlined below: Nutrition Status:        . Skin Assessment: I have examined the patient's skin and I agree with the wound assessment as performed by the wound care RN as outlined below:    Consultants:  Neurosurgery and oncology  Procedures:  As above  Antimicrobials:  Anti-infectives (From admission, onward)    None         Subjective: Patient seen and examined.  She is motivated in good spirits.  She has no new complaint today. Objective: Vitals:   03/24/24 1835 03/24/24 2000 03/25/24 0000 03/25/24 0400  BP: (!) 131/55 122/60 (!) 151/73 (!) 160/63  Pulse: 73 71 71 67  Resp:  18 16 16   Temp:  98.2 F (36.8 C) 97.8 F (36.6 C) 98.4 F (36.9 C)  TempSrc:  Oral Oral Oral  SpO2:  94% 96% 98%  Weight:      Height:       No intake or output data in the 24 hours ending 03/25/24 0805  Filed Weights   03/21/24 1158  Weight: 80.7 kg    Examination:  General exam: Appears calm and comfortable  Respiratory system: Clear to auscultation. Respiratory effort normal. Cardiovascular system: S1 & S2 heard,  RRR. No JVD, murmurs, rubs, gallops or clicks.  +1 pitting edema bilateral lower extremity. Gastrointestinal system: Abdomen is nondistended, soft and nontender. No organomegaly or masses felt. Normal bowel sounds heard. Central nervous system: Alert and oriented. No focal neurological deficits. Extremities: Symmetric 5 x 5 power. Skin: No rashes, lesions or ulcers.  Psychiatry: Judgement and insight appear normal. Mood & affect appropriate.   Data Reviewed: I have  personally reviewed following labs and imaging studies  CBC: Recent Labs  Lab 03/21/24 1331 03/22/24 0443 03/24/24 0457  WBC 8.6 7.5 6.8  NEUTROABS 6.2  --  4.7  HGB 13.1 12.1 13.2  HCT 39.8 37.2 40.8  MCV 94.8 96.4 96.7  PLT 339 302 352   Basic Metabolic Panel: Recent Labs  Lab 03/21/24 1331 03/22/24 0443 03/24/24 0457  NA 140 138 136  K 3.9 4.0 4.0  CL 104 104 103  CO2 24 25 21*  GLUCOSE 107* 122* 138*  BUN 11 10 14   CREATININE 0.63 0.72 0.77  CALCIUM  9.8 9.1 9.3   GFR: Estimated Creatinine Clearance: 54.9 mL/min (by C-G formula based on SCr of 0.77 mg/dL). Liver Function Tests: Recent Labs  Lab 03/22/24 0443  AST 22  ALT 14  ALKPHOS 53  BILITOT 0.5  PROT 6.4*  ALBUMIN 3.2*   No results for input(s): LIPASE, AMYLASE in the last 168 hours. No results for input(s): AMMONIA in the last 168 hours. Coagulation Profile: No results for input(s): INR, PROTIME in the last 168 hours. Cardiac Enzymes: No results for input(s): CKTOTAL, CKMB, CKMBINDEX, TROPONINI in the last 168 hours. BNP (last 3 results) No results for input(s): PROBNP in the last 8760 hours. HbA1C: No results for input(s): HGBA1C in the last 72 hours. CBG: No results for input(s): GLUCAP in the last 168 hours. Lipid Profile: No results for input(s): CHOL, HDL, LDLCALC, TRIG, CHOLHDL, LDLDIRECT in the last 72 hours. Thyroid Function Tests: No results for input(s): TSH, T4TOTAL, FREET4, T3FREE, THYROIDAB in the last 72 hours. Anemia Panel: No results for input(s): VITAMINB12, FOLATE, FERRITIN, TIBC, IRON, RETICCTPCT in the last 72 hours. Sepsis Labs: No results for input(s): PROCALCITON, LATICACIDVEN in the last 168 hours.  Recent Results (from the past 240 hours)  Surgical PCR screen     Status: None   Collection Time: 03/24/24  1:09 PM   Specimen: Nasal Mucosa; Nasal Swab  Result Value Ref Range Status   MRSA, PCR NEGATIVE  NEGATIVE Final   Staphylococcus aureus NEGATIVE NEGATIVE Final    Comment: (NOTE) The Xpert SA Assay (FDA approved for NASAL specimens in patients 75 years of age and older), is one component of a comprehensive surveillance program. It is not intended to diagnose infection nor to guide or monitor treatment. Performed at [facility], [address] [place], [place] [address]      Radiology Studies: MR BRAIN W WO CONTRAST Result Date: 03/24/2024 EXAM: MRI BRAIN WITH AND WITHOUT CONTRAST 03/24/2024 12:09:00 PM TECHNIQUE: Multiplanar multisequence MRI of the head/brain was performed with and without the administration of intravenous contrast. 8 mL gadobutrol  (GADAVIST ) 1 MMOL/ML injection 8 mL GADOBUTROL  1 MMOL/ML IV SOLN. COMPARISON: None available. CLINICAL HISTORY: Metastatic disease evaluation. FINDINGS: BRAIN AND VENTRICLES: No acute infarct. No acute intracranial hemorrhage. No mass effect or midline shift. No hydrocephalus. No evidence of metastatic disease to the brain. There is age-related atrophy and moderate diffuse cerebral white matter disease. There is a probable arachnoid cyst present posteriorly in the posterior cranial fossa just to  the right of midline, measuring approximately 2.8 x 2.8 x 4.3 cm. The sella is unremarkable. Normal flow voids. No mass or abnormal enhancement. ORBITS: The patient is status post bilateral lens replacement. No acute abnormality. SINUSES: No acute abnormality. BONES AND SOFT TISSUES: Normal bone marrow signal and enhancement. No evidence of metastatic disease to the calvaria or skull base. No acute soft tissue abnormality. IMPRESSION: 1. No evidence of metastatic disease to the brain, calvaria, or skull base. 2. Age-related atrophy and moderate diffuse cerebral white matter disease. 3. 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 Electronically signed by: Timothy Berrigan MD 03/24/2024  01:00 PM EDT RP Workstation: [id number]    Scheduled Meds:  acetaminophen   1,000 mg Oral Q8H   atorvastatin   40 mg Oral QPM   dexamethasone  (DECADRON ) injection  4 mg Intravenous Q8H   docusate sodium   100 mg Oral BID   ezetimibe   10 mg Oral Daily   gabapentin   100 mg Oral TID   methocarbamol  (ROBAXIN ) injection  500 mg Intravenous Q8H   metoprolol  succinate  50 mg Oral QPM   mupirocin  ointment  1 Application Nasal BID   polyethylene glycol  17 g Oral Daily   senna-docusate  2 tablet Oral QHS   Continuous Infusions:   LOS: 3 days   Fredia Skeeter, MD [facility]  03/25/2024, 8:05 AM   *Please note that this is a verbal dictation therefore any spelling or grammatical errors are due to the Dragon Medical One system interpretation.  Please page via Amion and do not message via secure chat for urgent patient care matters. Secure chat can be used for non urgent patient care matters.  How to contact the [facility] Attending or Consulting provider 7A - 7P or covering provider during after hours 7P -7A, for this patient?  Check the care team in [facility] and look for a) attending/consulting [facility] provider listed and b) the [facility] team listed. Page or secure chat 7A-7P. Log into [email or web address] and use [facility] universal password to access. If you do not have the password, please contact the hospital operator. Locate the [facility] provider you are looking for under [facility] and page to a number that you can be directly reached. If you still have difficulty reaching the provider, please page the [facility] (Director on Call) for the Hospitalists listed on amion for assistance.

## 2024-03-25 NOTE — Progress Notes (Signed)
 Orthopedic Tech Progress Note Patient Details:  Lynn Hogan December 31, 1945 [id number] Brace was delivered by somebody but was not charted for it.  Ortho Devices Type of Ortho Device: Thoracolumbar corset (TLSO) Ortho Device/Splint Location: T SPINE Ortho Device/Splint Interventions: Ordered   Post Interventions Patient Tolerated: Well Instructions Provided: Care of device   Lynn Hogan Lynn Hogan 03/25/2024, 11:43 PM

## 2024-03-25 NOTE — Op Note (Signed)
 03/25/2024  6:24 PM  PATIENT:  Lynn Hogan  78 y.o. 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  Decompressive thoracic laminectomy and medial facetectomy and foraminotomy T8-9 T9-10 T10-11 with transpedicular decompression T9 on the right with removal of epidural tumor, 2.  Posterolateral arthrodesis T7-T12 utilizing locally harvested morselized autologous bone graft and morselized allograft, 3.  Segmental fixation T7-T12 utilizing Alphatec pedicle screws  SURGEON:  Alm Molt, MD  ASSISTANTS: Suzen Pean, FNP  ANESTHESIA:   General  EBL: 200 ml  Total I/O In: 1520 [I.V.:1500; IV Piggyback:20] Out: 700 [Urine:500; Blood:200]  BLOOD ADMINISTERED: none  DRAINS: Medium Hemovac  SPECIMEN: T9 vertebral body and epidural mass  INDICATION FOR PROCEDURE: This patient presented with severe back pain. Imaging showed tumor within the T9 and T10 vertebral bodies with pathologic fracture and spinal stenosis. The patient tried conservative measures without relief. Pain was debilitating. Recommended thoracic decompression and instrumented fusion with decompression of the tumor to obtain a pathologic diagnosis. Patient understood the risks, benefits, and alternatives and potential outcomes and wished to proceed.  PROCEDURE DETAILS: The patient was taken to the operating room and after induction of adequate generalized endotracheal anesthesia, the patient was rolled into the prone position on chest rolls and all pressure points were padded.  We planned our incision with AP and lateral fluoroscopy.  The thoracic and lumbar region was cleaned with Betadine scrub and then prepped with DuraPrep and draped in the usual sterile fashion. Ten cc of local anesthesia was injected and then a dorsal midline incision was made and carried down to the thoracic fascia. The fascia was opened  and the paraspinous musculature was taken down in a subperiosteal fashion to expose T7-T12.  We spent conservative time getting AP and lateral fluoroscopy to confirm our level.  Turned our attention to the placement of the pedicle screws and we marked our pedicle screw entry zones utilizing AP fluoroscopy.  The pedicles were quite tiny at each level.  We then used lateral fluoroscopy and probed each pedicle at T7-T8 T11 and T12 utilizing of the thoracic pedicle probe.  We palpated with a ball probe.  We then tapped each pedicle with a 4.5 mm tap and then palpated with a ball probe once again.  We then placed 5.5 x 45 mm pedicle screws at T7-T8 and T11 bilaterally and 5.5 x 50 mm screws at T12.  I then removed the spinous processes of T8-T9 and T10.  We saved this bone for later arthrodesis.  I used a high-speed drill to drill the lamina down to an eggshell.  I used a combination of the high-speed drill and the Kerrison punches to perform a hemilaminectomy, medial facetectomy, and foraminotomy at T8-9 T9-10 and T10-11 bilaterally. The underlying yellow ligament was opened and removed in a piecemeal fashion to expose the underlying dura and exiting nerve root. I undercut the lateral recess and dissected down until I was medial to and distal to the pedicle of T T8 and T9.  At the T9 pedicle I drilled the medial part of the pedicle to perform a transpedicular decompression.  I identified the T9 nerve root.  I then worked under this and found significant amounts of soft gelatinous gray tumor.  We removed a significant amount with simple pituitary rongeur's.  I push some tumor down from the epidural space with a coronary dilator and removed it.  We sent this  tumor for permanent pathology.   We then dried the epidural space with Surgifoam and Gelfoam.  We cut to kyphotic rods and placed them into the multiaxial screw heads of the pedicle screws and locked these into position with the locking caps and antitorque device.   Irrigated with 0.5% povidone and iodine solution and got our final x-rays.  We then washed this away with saline solution.  We decorticated the posterior elements from T7-T12 and placed a mixture of local autograft and morselized allograft to perform arthrodesis T7-T12 on the left.   lined the dura with Gelfoam, placed a medium Hemovac drain through a separate stab incision.  And then closed the fascia with 0 Vicryl.  Placed 2 g of myriad morsels into the subcutaneous space.  I closed the subcutaneous tissues with 2-0 Vicryl and the subcuticular tissues with 3-0 Vicryl. The skin was then closed with benzoin and Steri-Strips. The drapes were removed, a sterile dressing was applied.  My nurse practitioner was involved in the exposure, safe decompression of the neural elements, placement of the pedicle screws and the closure. the patient was awakened from general anesthesia and transferred to the recovery room in stable condition. At the end of the procedure all sponge, needle and instrument counts were correct.    PLAN OF CARE: Admit to inpatient   PATIENT DISPOSITION:  PACU - hemodynamically stable.   Delay start of Pharmacological VTE agent (>24hrs) due to surgical blood loss or risk of bleeding:  yes

## 2024-03-25 NOTE — Progress Notes (Signed)
 Patient ID: Lynn Hogan, female   DOB: 1945/11/19, 78 y.o.   MRN: [id number] No change in exam.  Questions encouraged and answered.  She is ready to move forward with decompressive laminectomy for epidural tumor followed by instrumented fusion T7-T12.  She has very diminutive pedicles and therefore she may require the in out in technique for pedicle screw placement.

## 2024-03-26 DIAGNOSIS — G9589 Other specified diseases of spinal cord: Secondary | ICD-10-CM | POA: Diagnosis not present

## 2024-03-26 LAB — CBC WITH DIFFERENTIAL/PLATELET
Abs Immature Granulocytes: 0.06 K/uL (ref 0.00–0.07)
Basophils Absolute: 0 K/uL (ref 0.0–0.1)
Basophils Relative: 0 %
Eosinophils Absolute: 0 K/uL (ref 0.0–0.5)
Eosinophils Relative: 0 %
HCT: 36.9 % (ref 36.0–46.0)
Hemoglobin: 12 g/dL (ref 12.0–15.0)
Immature Granulocytes: 1 %
Lymphocytes Relative: 14 %
Lymphs Abs: 1.4 K/uL (ref 0.7–4.0)
MCH: 31.2 pg (ref 26.0–34.0)
MCHC: 32.5 g/dL (ref 30.0–36.0)
MCV: 95.8 fL (ref 80.0–100.0)
Monocytes Absolute: 0.8 K/uL (ref 0.1–1.0)
Monocytes Relative: 8 %
Neutro Abs: 7.8 K/uL — ABNORMAL HIGH (ref 1.7–7.7)
Neutrophils Relative %: 77 %
Platelets: 383 K/uL (ref 150–400)
RBC: 3.85 MIL/uL — ABNORMAL LOW (ref 3.87–5.11)
RDW: 13.9 % (ref 11.5–15.5)
WBC: 10.1 K/uL (ref 4.0–10.5)
nRBC: 0 % (ref 0.0–0.2)

## 2024-03-26 LAB — BASIC METABOLIC PANEL WITH GFR
Anion gap: 8 (ref 5–15)
BUN: 11 mg/dL (ref 8–23)
CO2: 23 mmol/L (ref 22–32)
Calcium: 8.6 mg/dL — ABNORMAL LOW (ref 8.9–10.3)
Chloride: 105 mmol/L (ref 98–111)
Creatinine, Ser: 0.84 mg/dL (ref 0.44–1.00)
GFR, Estimated: >60 mL/min (ref >60)
Glucose, Bld: 153 mg/dL — ABNORMAL HIGH (ref 70–99)
Potassium: 4 mmol/L (ref 3.5–5.1)
Sodium: 136 mmol/L (ref 135–145)

## 2024-03-26 MED ORDER — HYDRALAZINE HCL 20 MG/ML IJ SOLN
10.0000 mg | Freq: Four times a day (QID) | INTRAMUSCULAR | Status: DC | PRN
  Administered 2024-03-26 – 2024-04-01 (×3): 10 mg via INTRAVENOUS
  Filled 2024-03-26 (×3): qty 1

## 2024-03-26 NOTE — Progress Notes (Signed)

## 2024-03-26 NOTE — Progress Notes (Signed)
 NEUROSURGERY PROGRESS NOTE  Doing well. Complains of appropriate back soreness. No leg pain No numbness, tingling or weakness Good strength and sensation Incision CDI  Temp:  [97.4 F (36.3 C)-98.7 F (37.1 C)] 98.5 F (36.9 C) (09/27 0357) Pulse Rate:  [58-83] 58 (09/27 0357) Resp:  [14-21] 17 (09/27 0357) BP: (144-191)/(61-93) 144/61 (09/27 0357) SpO2:  [92 %-100 %] 95 % (09/27 0357) Weight:  [80.7 kg] 80.7 kg (09/26 1242)  Plan: Would like her to get up and ambulate at least 3 times during today's shift.  Will have her work with therapy.  Don brace when out of bed  Suzen Chiquita Pean, NP 03/26/2024 7:12 AM

## 2024-03-26 NOTE — Progress Notes (Signed)
 PROGRESS NOTE    Lynn Hogan  FMW:[id number] DOB: 02-Jun-1946 DOA: 03/21/2024 PCP: Joshua Franky Sharper, PA-C   Brief Narrative:  Lynn Hogan is a 78 y.o. female with medical history significant of malignant melanoma of the left forearm status post chemotherapy and surgery years ago, coronary artery disease, hyperlipidemia, essential hypertension, ischemic cardiomyopathy, degenerative disc disease who presented to the ER at [facility] with back pain.  MRI showed metastatic disease at T9 and T10 as well as T8.  Seen by neurosurgery oncology.  Assessment & Plan:   Principal Problem:   Spinal cord mass (HCC) Active Problems:   Hyperlipidemia   CAD (coronary artery disease)   Bone lesion   History of heart failure   Hypertension   Malignant melanoma of left forearm (HCC)   Status post thoracic spinal fusion  Back Pain due to Metastatic Cancer: Osseus Metastatic Disease at T9 with Extension inferiorly to involve T10  T8 Metastasis/moderate to severe canal stenosis. CT chest abdomen pelvis shows no primary source was found.  MRI brain did not show any metastatic disease. Seen by oncology, they recommended decompression surgery for which patient refused initially but eventually agreed and underwent surgical decompression by neurosurgery on 03/25/2024.  Doing well postoperatively.  Neurosurgery recommends supportive care, PT OT. Don brace when out of bed.   History of Melanoma Malignant melanoma of L forearm 03/13/2022    Hypertension Metoprolol     Dyslipidemia Lipitor , zetia    CAD Noted, no CP  DVT prophylaxis: SCD's Start: 03/25/24 2034 Place and maintain sequential compression device Start: 03/24/24 0750   Code Status: Full Code  Family Communication:  None present at bedside.  Plan of care discussed with patient in length and he/she verbalized understanding and agreed with it.  Status is: Inpatient Remains inpatient appropriate because: To be seen by PT OT.  Patient  prefers to go to SNF as she lives alone.   Estimated body mass index is 33.62 kg/m as calculated from the following:   Height as of this encounter: 5' 1 (1.549 m).   Weight as of this encounter: 80.7 kg.    Nutritional Assessment: Body mass index is 33.62 kg/m.[name] Seen by dietician.  I agree with the assessment and plan as outlined below: Nutrition Status:        . Skin Assessment: I have examined the patient's skin and I agree with the wound assessment as performed by the wound care RN as outlined below:    Consultants:  Neurosurgery and oncology  Procedures:  As above  Antimicrobials:  Anti-infectives (From admission, onward)    Start     Dose/Rate Route Frequency Ordered Stop   03/25/24 2300  ceFAZolin  (ANCEF ) IVPB 2g/100 mL premix        2 g 200 mL/hr over 30 Minutes Intravenous Every 8 hours 03/25/24 2033 03/26/24 0741         Subjective: Seen and examined.  She has no complaints at all.  Objective: Vitals:   03/25/24 2016 03/25/24 2359 03/26/24 0357 03/26/24 0801  BP: (!) 166/90 (!) 163/69 (!) 144/61 (!) 157/94  Pulse: 68 62 (!) 58 66  Resp: 18 19 17 16   Temp: (!) 97.5 F (36.4 C) (!) 97.4 F (36.3 C) 98.5 F (36.9 C) 98.5 F (36.9 C)  TempSrc: Oral Oral Oral Oral  SpO2: 98% 95% 95% 95%  Weight:      Height:        Intake/Output Summary (Last 24 hours) at 03/26/2024 1138 Last data filed  at 03/26/2024 0523 Gross per 24 hour  Intake 2008.65 ml  Output 1750 ml  Net 258.65 ml    Filed Weights   03/21/24 1158 03/25/24 1242  Weight: 80.7 kg 80.7 kg    Examination:  General exam: Appears calm and comfortable  Respiratory system: Clear to auscultation. Respiratory effort normal. Cardiovascular system: S1 & S2 heard, RRR. No JVD, murmurs, rubs, gallops or clicks.  +1 pitting edema bilateral lower extremity. Gastrointestinal system: Abdomen is nondistended, soft and nontender. No organomegaly or masses felt. Normal bowel sounds heard. Central  nervous system: Alert and oriented. No focal neurological deficits. Extremities: Symmetric 5 x 5 power. Skin: No rashes, lesions or ulcers.  Psychiatry: Judgement and insight appear normal. Mood & affect appropriate.   Data Reviewed: I have personally reviewed following labs and imaging studies  CBC: Recent Labs  Lab 03/21/24 1331 03/22/24 0443 03/24/24 0457 03/26/24 0439  WBC 8.6 7.5 6.8 10.1  NEUTROABS 6.2  --  4.7 7.8*  HGB 13.1 12.1 13.2 12.0  HCT 39.8 37.2 40.8 36.9  MCV 94.8 96.4 96.7 95.8  PLT 339 302 352 383   Basic Metabolic Panel: Recent Labs  Lab 03/21/24 1331 03/22/24 0443 03/24/24 0457 03/26/24 0439  NA 140 138 136 136  K 3.9 4.0 4.0 4.0  CL 104 104 103 105  CO2 24 25 21* 23  GLUCOSE 107* 122* 138* 153*  BUN 11 10 14 11   CREATININE 0.63 0.72 0.77 0.84  CALCIUM  9.8 9.1 9.3 8.6*   GFR: Estimated Creatinine Clearance: 52.3 mL/min (by C-G formula based on SCr of 0.84 mg/dL). Liver Function Tests: Recent Labs  Lab 03/22/24 0443  AST 22  ALT 14  ALKPHOS 53  BILITOT 0.5  PROT 6.4*  ALBUMIN 3.2*   No results for input(s): LIPASE, AMYLASE in the last 168 hours. No results for input(s): AMMONIA in the last 168 hours. Coagulation Profile: No results for input(s): INR, PROTIME in the last 168 hours. Cardiac Enzymes: No results for input(s): CKTOTAL, CKMB, CKMBINDEX, TROPONINI in the last 168 hours. BNP (last 3 results) No results for input(s): PROBNP in the last 8760 hours. HbA1C: No results for input(s): HGBA1C in the last 72 hours. CBG: No results for input(s): GLUCAP in the last 168 hours. Lipid Profile: No results for input(s): CHOL, HDL, LDLCALC, TRIG, CHOLHDL, LDLDIRECT in the last 72 hours. Thyroid Function Tests: No results for input(s): TSH, T4TOTAL, FREET4, T3FREE, THYROIDAB in the last 72 hours. Anemia Panel: No results for input(s): VITAMINB12, FOLATE, FERRITIN, TIBC, IRON,  RETICCTPCT in the last 72 hours. Sepsis Labs: No results for input(s): PROCALCITON, LATICACIDVEN in the last 168 hours.  Recent Results (from the past 240 hours)  Surgical PCR screen     Status: None   Collection Time: 03/24/24  1:09 PM   Specimen: Nasal Mucosa; Nasal Swab  Result Value Ref Range Status   MRSA, PCR NEGATIVE NEGATIVE Final   Staphylococcus aureus NEGATIVE NEGATIVE Final    Comment: (NOTE) The Xpert SA Assay (FDA approved for NASAL specimens in patients 35 years of age and older), is one component of a comprehensive surveillance program. It is not intended to diagnose infection nor to guide or monitor treatment. Performed at [facility], [address] [place], [place] [address]      Radiology Studies: DG Thoracic Spine 2 View Result Date: 03/25/2024 CLINICAL DATA:  Elective surgery.  T9-10 laminectomy for tumor. EXAM: THORACIC SPINE 2 VIEWS COMPARISON:  MRI thoracic spine 03/21/2024 FINDINGS:  Intraoperative fluoroscopy is obtained for surgical control purposes. Fluoroscopy time is recorded at 3 minutes 44 seconds. Dose 70.8 mGy. 5 spot fluoroscopic images are obtained. Spot fluoroscopic images provided demonstrate postoperative changes with posterior rod and screw fixation from T6 through T12, crossing the involved T9 and T10 vertebrae. No anterior subluxations. IMPRESSION: Intraoperative fluoroscopy is obtained for surgical control purposes demonstrating rod and screw fixation of the mid/lower thoracic spine. Electronically Signed   By: Elsie Gravely M.D.   On: 03/25/2024 20:15   DG C-Arm 1-60 Min-No Report Result Date: 03/25/2024 Fluoroscopy was utilized by the requesting physician.  No radiographic interpretation.   DG C-Arm 1-60 Min-No Report Result Date: 03/25/2024 Fluoroscopy was utilized by the requesting physician.  No radiographic interpretation.   DG C-Arm 1-60 Min-No Report Result Date: 03/25/2024 Fluoroscopy was utilized by the requesting  physician.  No radiographic interpretation.   MR BRAIN W WO CONTRAST Result Date: 03/24/2024 EXAM: MRI BRAIN WITH AND WITHOUT CONTRAST 03/24/2024 12:09:00 PM TECHNIQUE: Multiplanar multisequence MRI of the head/brain was performed with and without the administration of intravenous contrast. 8 mL gadobutrol  (GADAVIST ) 1 MMOL/ML injection 8 mL GADOBUTROL  1 MMOL/ML IV SOLN. COMPARISON: None available. CLINICAL HISTORY: Metastatic disease evaluation. FINDINGS: BRAIN AND VENTRICLES: No acute infarct. No acute intracranial hemorrhage. No mass effect or midline shift. No hydrocephalus. No evidence of metastatic disease to the brain. There is age-related atrophy and moderate diffuse cerebral white matter disease. 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 The sella is unremarkable. Normal flow voids. No mass or abnormal enhancement. ORBITS: The patient is status post bilateral lens replacement. No acute abnormality. SINUSES: No acute abnormality. BONES AND SOFT TISSUES: Normal bone marrow signal and enhancement. No evidence of metastatic disease to the calvaria or skull base. No acute soft tissue abnormality. IMPRESSION: 1. No evidence of metastatic disease to the brain, calvaria, or skull base. 2. Age-related atrophy and moderate diffuse cerebral white matter disease. 3. 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 Electronically signed by: Timothy Berrigan MD 03/24/2024 01:00 PM EDT RP Workstation: [id number]    Scheduled Meds:  acetaminophen   1,000 mg Oral Q6H   atorvastatin   40 mg Oral QPM   dexamethasone  (DECADRON ) injection  4 mg Intravenous Q6H   Or   dexamethasone   4 mg Oral Q6H   docusate sodium   100 mg Oral BID   ezetimibe   10 mg Oral Daily   gabapentin   100 mg Oral TID   methocarbamol  (ROBAXIN ) injection  500 mg  Intravenous Q8H   metoprolol  succinate  50 mg Oral QPM   mupirocin  ointment  1 Application Nasal BID   polyethylene glycol  17 g Oral Daily   senna  1 tablet Oral BID   senna-docusate  2 tablet Oral QHS   sodium chloride  flush  3 mL Intravenous Q12H   Continuous Infusions:  sodium chloride      0.9 % NaCl with KCl 20 mEq / L 75 mL/hr at 03/25/24 2151     LOS: 4 days   Fredia Skeeter, MD [facility]  03/26/2024, 11:38 AM   *Please note that this is a verbal dictation therefore any spelling or grammatical errors are due to the Dragon Medical One system interpretation.  Please page via Amion and do not message via secure chat for urgent patient care matters.  Secure chat can be used for non urgent patient care matters.  How to contact the [facility] Attending or Consulting provider 7A - 7P or covering provider during after hours 7P -7A, for this patient?  Check the care team in [facility] and look for a) attending/consulting [facility] provider listed and b) the [facility] team listed. Page or secure chat 7A-7P. Log into [email or web address] and use [place]'s universal password to access. If you do not have the password, please contact the hospital operator. Locate the [facility] provider you are looking for under [facility] and page to a number that you can be directly reached. If you still have difficulty reaching the provider, please page the [facility] (Director on Call) for the Hospitalists listed on amion for assistance.

## 2024-03-26 NOTE — Plan of Care (Signed)

## 2024-03-26 NOTE — Evaluation (Signed)
 Physical Therapy Evaluation Patient Details Name: Lynn Hogan MRN: [id number] DOB: 10-Sep-1945 Today's Date: 03/26/2024  History of Present Illness  Pt is a 78 y.o. female presenting 9/22 with progressive back pain. 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 S/p decompressive thoracic laminectomy/medial facetectomy, foraminotomy T8-11 with posterolateral arthridesis T7-12, allograft, segmental fixation T7-12. PMH: melanoma if L forearm, CAD, HLD, essential HTN, ischemic cardiomyopathy, DDD, NSTEMI   Clinical Impression  Pt admitted with above and underwent back surgery. PTA pt lived alone and ambulated without AD. Pt now requiring maxA for TLSO application, modA for transfers and ambulation with use of RW. Pt with poor adherence to back precautions. Will bring handout next visit. Pt motivated to return to independence and home. Pt to greatly benefit from aggressive inpatient rehab program > 3 hrs a day to address above deficits and achieve safe mod I level of function for safe transition home with support of family. Acute PT to cont to follow.        If plan is discharge home, recommend the following: A lot of help with walking and/or transfers;A lot of help with bathing/dressing/bathroom;Help with stairs or ramp for entrance;Assist for transportation   Can travel by private vehicle        Equipment Recommendations None recommended by PT (defer to next venue)  Recommendations for Other Services  Rehab consult    Functional Status Assessment Patient has had a recent decline in their functional status and demonstrates the ability to make significant improvements in function in a reasonable and predictable amount of time.     Precautions / Restrictions Precautions Precautions: Back Precaution Booklet Issued: No Recall of Precautions/Restrictions: Impaired Required Braces or  Orthoses: Spinal Brace Spinal Brace: Thoracolumbosacral orthotic;Applied in sitting position Restrictions Weight Bearing Restrictions Per Provider Order: No      Mobility  Bed Mobility Overal bed mobility: Needs Assistance Bed Mobility: Supine to Sit     Supine to sit: Min assist, HOB elevated     General bed mobility comments: cues for sequence and back precautions. pt turning out of bed with HOB elevated rather than log roll this session. pt reports she plans to sleep in recliner at home    Transfers Overall transfer level: Needs assistance Equipment used: Rolling walker (2 wheels) Transfers: Sit to/from Stand Sit to Stand: Mod assist, +2 safety/equipment, +2 physical assistance           General transfer comment: dense cues for hand placement, mod A for anterior weight shift and rise, modA x2 to power up    Ambulation/Gait Ambulation/Gait assistance: Mod assist, +2 physical assistance Gait Distance (Feet): 15 Feet Assistive device: Rolling walker (2 wheels) Gait Pattern/deviations: Step-to pattern, Decreased stride length Gait velocity: dec     General Gait Details: max directional verbal cues to stay in walker, modA for walker management, freq stops  Stairs            Wheelchair Mobility     Tilt Bed    Modified Rankin (Stroke Patients Only)       Balance Overall balance assessment: Needs assistance Sitting-balance support: Single extremity supported, Bilateral upper extremity supported, Feet supported Sitting balance-Leahy Scale: Poor Sitting balance - Comments: benefits from at least one UE support   Standing balance support: Bilateral upper extremity supported Standing balance-Leahy Scale: Poor Standing balance comment: reliant on device  Pertinent Vitals/Pain Pain Assessment Pain Assessment: 0-10 Pain Score: 8  Pain Location: back Pain Descriptors / Indicators: Aching, Discomfort, Operative site  guarding Pain Intervention(s): Monitored during session    Home Living Family/patient expects to be discharged to:: Private residence Living Arrangements: Alone Available Help at Discharge: Family;Available PRN/intermittently Type of Home: Other(Comment) (town home) Home Access: Stairs to enter   Secretary/administrator of Steps: 1   Home Layout: One level Home Equipment: Educational psychologist (4 wheels);Cane - quad Additional Comments: plans to sleep in recliner at home    Prior Function Prior Level of Function : Independent/Modified Independent             Mobility Comments: uses rollator in the home but reports more for convenience of transporting items than balance ADLs Comments: pt reports independent     Extremity/Trunk Assessment   Upper Extremity Assessment Upper Extremity Assessment: Right hand dominant    Lower Extremity Assessment Lower Extremity Assessment: Generalized weakness    Cervical / Trunk Assessment Cervical / Trunk Assessment: Back Surgery;Kyphotic  Communication   Communication Communication: No apparent difficulties    Cognition Arousal: Alert Behavior During Therapy: WFL for tasks assessed/performed                           PT - Cognition Comments: tangential, particular Following commands: Intact       Cueing Cueing Techniques: Verbal cues, Gestural cues, Tactile cues     General Comments General comments (skin integrity, edema, etc.): hemovac intact, incision intact    Exercises     Assessment/Plan    PT Assessment Patient needs continued PT services  PT Problem List Decreased strength;Decreased range of motion;Decreased activity tolerance;Decreased balance;Decreased mobility;Decreased knowledge of precautions       PT Treatment Interventions DME instruction;Stair training;Gait training;Functional mobility training;Therapeutic activities;Therapeutic exercise;Balance training;Neuromuscular re-education    PT  Goals (Current goals can be found in the Care Plan section)  Acute Rehab PT Goals Patient Stated Goal: get better, go to rehab, and go home PT Goal Formulation: With patient Time For Goal Achievement: 04/09/24 Potential to Achieve Goals: Good    Frequency Min 4X/week     Co-evaluation   Reason for Co-Treatment: Complexity of the patient's impairments (multi-system involvement);For patient/therapist safety;To address functional/ADL transfers PT goals addressed during session: Mobility/safety with mobility;Balance;Proper use of DME OT goals addressed during session: Proper use of Adaptive equipment and DME;ADL's and self-care       AM-PAC PT 6 Clicks Mobility  Outcome Measure Help needed turning from your back to your side while in a flat bed without using bedrails?: A Lot Help needed moving from lying on your back to sitting on the side of a flat bed without using bedrails?: A Lot Help needed moving to and from a bed to a chair (including a wheelchair)?: A Lot Help needed standing up from a chair using your arms (e.g., wheelchair or bedside chair)?: A Lot Help needed to walk in hospital room?: A Lot Help needed climbing 3-5 steps with a railing? : Total 6 Click Score: 11    End of Session Equipment Utilized During Treatment: Gait belt;Back brace Activity Tolerance: Patient tolerated treatment well Patient left: in chair;with call bell/phone within reach;with chair alarm set Nurse Communication: Mobility status PT Visit Diagnosis: Unsteadiness on feet (R26.81);Muscle weakness (generalized) (M62.81);Difficulty in walking, not elsewhere classified (R26.2)    Time: 8875-8796 PT Time Calculation (min) (ACUTE ONLY): 39 min   Charges:  PT Evaluation $PT Eval Moderate Complexity: 1 Mod PT Treatments $Gait Training: 8-22 mins PT General Charges $$ ACUTE PT VISIT: 1 Visit         Norene Ames, PT, DPT Acute Rehabilitation Services Secure chat preferred Office #:  [phone]   Norene CHRISTELLA Ames 03/26/2024, 1:55 PM

## 2024-03-26 NOTE — Evaluation (Signed)
 Occupational Therapy Evaluation Patient Details Name: Lynn Hogan MRN: [id number] DOB: [birth year] Today's Date: 03/26/2024   History of Present Illness   Pt is a 78 y.o. female presenting 9/22 with progressive back pain. 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 S/p decompressive thoracic laminectomy/medial facetectomy, foraminotomy T8-11 with posterolateral arthridesis T7-12, allograft, segmental fixation T7-12. 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 Upon eval, pt performing UB ADL with min-max A and LB ADL with max A. Pt educated regarding precautions, limited by knowledge of functional implementation, back pain BLE weakness, decreased activity tolerance. Pt needing up to mod A for transfers and cues for RW management. Due to significant functional decline, recommending intensive multidisciplinary rehabilitation >3 hours/day to optimize safety and independence in ADL.        If plan is discharge home, recommend the following:   A little help with walking and/or transfers;A lot of help with walking and/or transfers;A lot of help with bathing/dressing/bathroom;Assistance with cooking/housework;Assist for transportation;Help with stairs or ramp for entrance     Functional Status Assessment   Patient has had a recent decline in their functional status and demonstrates the ability to make significant improvements in function in a reasonable and predictable amount of time.     Equipment Recommendations   Other (comment) (defer)     Recommendations for Other Services   Rehab consult     Precautions/Restrictions   Precautions Precautions: Back Precaution Booklet Issued: No Recall of  Precautions/Restrictions: Impaired Required Braces or Orthoses: Spinal Brace Spinal Brace: Thoracolumbosacral orthotic;Applied in sitting position Restrictions Weight Bearing Restrictions Per Provider Order: No     Mobility Bed Mobility Overal bed mobility: Needs Assistance Bed Mobility: Supine to Sit     Supine to sit: Min assist, HOB elevated     General bed mobility comments: cues for sequence and back precautions. pt turning out of bed with HOB elevated rather than log roll this session. pt reports she plans to sleep in recliner at home    Transfers Overall transfer level: Needs assistance Equipment used: Rolling walker (2 wheels) Transfers: Sit to/from Stand Sit to Stand: Mod assist, +2 safety/equipment, +2 physical assistance           General transfer comment: dense cues for hand placement, mod A for anterior weight shift and rise      Balance Overall balance assessment: Needs assistance Sitting-balance support: Single extremity supported, Bilateral upper extremity supported, Feet supported Sitting balance-Leahy Scale: Poor Sitting balance - Comments: benefits from at least one UE support   Standing balance support: Bilateral upper extremity supported Standing balance-Leahy Scale: Poor Standing balance comment: reliant on device                           ADL either performed or assessed with clinical judgement   ADL Overall ADL's : Needs assistance/impaired Eating/Feeding: Set up;Sitting   Grooming: Set up;Sitting   Upper Body Bathing: Minimal assistance;Sitting   Lower Body Bathing: Maximal assistance;Sit to/from stand   Upper Body Dressing : Sitting;Maximal assistance Upper Body Dressing Details (indicate cue type and reason): for brace application Lower Body Dressing: Maximal assistance;Sit to/from stand;Total assistance;+2 for safety/equipment   Toilet Transfer: Minimal assistance;+2 for safety/equipment;Ambulation;Rolling walker (2  wheels);Moderate assistance  Functional mobility during ADLs: Moderate assistance;+2 for safety/equipment;Rolling walker (2 wheels)       Vision Patient Visual Report:  (prior cataract surgery) Vision Assessment?: Wears glasses for reading     Perception Perception: Not tested       Praxis Praxis: Not tested       Pertinent Vitals/Pain Pain Assessment Pain Assessment: 0-10 Pain Score: 8  Pain Location: back Pain Descriptors / Indicators: Aching, Discomfort, Operative site guarding Pain Intervention(s): Monitored during session, Premedicated before session     Extremity/Trunk Assessment Upper Extremity Assessment Upper Extremity Assessment: Right hand dominant;Overall [facility] for tasks assessed   Lower Extremity Assessment Lower Extremity Assessment: Defer to PT evaluation   Cervical / Trunk Assessment Cervical / Trunk Assessment: Back Surgery;Kyphotic   Communication Communication Communication: No apparent difficulties   Cognition Arousal: Alert Behavior During Therapy: WFL for tasks assessed/performed Cognition: No apparent impairments                               Following commands: Intact       Cueing  General Comments   Cueing Techniques: Verbal cues;Gestural cues;Tactile cues      Exercises     Shoulder Instructions      Home Living Family/patient expects to be discharged to:: Private residence Living Arrangements: Alone Available Help at Discharge: Family;Available PRN/intermittently Type of Home: Other(Comment) (town home) Home Access: Stairs to enter Secretary/administrator of Steps: 1   Home Layout: One level     Bathroom Shower/Tub: Chief Strategy Officer: Standard (has squatty potty)     Home Equipment: Educational psychologist (4 wheels);Cane - quad   Additional Comments: plans to sleep in recliner at home      Prior Functioning/Environment Prior Level of Function : Independent/Modified  Independent             Mobility Comments: uses rollator in the home but reports more for convenience of transporting items than balance ADLs Comments: pt reports independent    OT Problem List: Decreased strength;Decreased activity tolerance;Impaired balance (sitting and/or standing);Decreased safety awareness;Decreased knowledge of use of DME or AE;Decreased knowledge of precautions;Pain   OT Treatment/Interventions: Self-care/ADL training;Therapeutic exercise;DME and/or AE instruction;Therapeutic activities;Balance training;Patient/family education      OT Goals(Current goals can be found in the care plan section)   Acute Rehab OT Goals Patient Stated Goal: get better OT Goal Formulation: With patient Time For Goal Achievement: 04/09/24 Potential to Achieve Goals: Good   OT Frequency:  Min 2X/week    Co-evaluation PT/OT/SLP Co-Evaluation/Treatment: Yes Reason for Co-Treatment: Complexity of the patient's impairments (multi-system involvement);For patient/therapist safety;To address functional/ADL transfers PT goals addressed during session: Mobility/safety with mobility;Balance;Proper use of DME OT goals addressed during session: Proper use of Adaptive equipment and DME;ADL's and self-care      AM-PAC OT 6 Clicks Daily Activity     Outcome Measure Help from another person eating meals?: None Help from another person taking care of personal grooming?: A Little Help from another person toileting, which includes using toliet, bedpan, or urinal?: A Lot Help from another person bathing (including washing, rinsing, drying)?: A Lot Help from another person to put on and taking off regular upper body clothing?: A Little Help from another person to put on and taking off regular lower body clothing?: A Lot 6 Click Score: 16   End of Session Equipment Utilized During Treatment: Gait belt;Rolling walker (2 wheels);Back brace Nurse Communication: Mobility status  Activity  Tolerance: Patient tolerated treatment well Patient left: in chair;with call bell/phone within reach;with chair alarm set  OT Visit Diagnosis: Unsteadiness on feet (R26.81);Muscle weakness (generalized) (M62.81);Pain                Time: 8875-8795 OT Time Calculation (min): 40 min Charges:  OT General Charges $OT Visit: 1 Visit OT Evaluation $OT Eval Moderate Complexity: 1 Mod  Elma JONETTA Lebron FREDERICK, OTR/L [facility] Acute Rehabilitation Office: [phone]   Elma JONETTA Lebron 03/26/2024, 1:03 PM

## 2024-03-27 DIAGNOSIS — G9589 Other specified diseases of spinal cord: Secondary | ICD-10-CM | POA: Diagnosis not present

## 2024-03-27 LAB — HEMOGLOBIN A1C
Hgb A1c MFr Bld: 5.7 % — ABNORMAL HIGH (ref 4.8–5.6)
Mean Plasma Glucose: 116.89 mg/dL

## 2024-03-27 NOTE — Plan of Care (Signed)

## 2024-03-27 NOTE — Progress Notes (Signed)
 NEUROSURGERY PROGRESS NOTE  Doing well. Complains of appropriate back soreness. No leg pain.  Has a difficult time getting comfortable in the bed so she's been sitting in the chair some.she did ambulate in the room yesterday. Good strength and sensation Incision CDI  Temp:  [97.6 F (36.4 C)-99.6 F (37.6 C)] 97.8 F (36.6 C) (09/28 0820) Pulse Rate:  [55-76] 56 (09/28 0820) Resp:  [14-20] 14 (09/28 0820) BP: (142-183)/(60-73) 147/63 (09/28 0820) SpO2:  [92 %-95 %] 92 % (09/28 0820)  Plan: We will continue the drain for one more day and plan to dc it tomorrow. Good candidate for rehab placement.  Lynn Chiquita Pean, NP 03/27/2024 9:01 AM

## 2024-03-27 NOTE — Progress Notes (Signed)
 PROGRESS NOTE    Eleen Litz  FMW:[id number] DOB: [birth year] DOA: 03/21/2024 PCP: Joshua Franky Sharper, PA-C   Brief Narrative:  Lynn Hogan is a 78 y.o. 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  MRI showed metastatic disease at T9 and T10 as well as T8.  Seen by neurosurgery oncology.  Assessment & Plan:   Principal Problem:   Spinal cord mass (HCC) Active Problems:   Hyperlipidemia   CAD (coronary artery disease)   Bone lesion   History of heart failure   Hypertension   Malignant melanoma of left forearm (HCC)   Status post thoracic spinal fusion  Back Pain due to Metastatic Cancer: Osseus Metastatic Disease at T9 with Extension inferiorly to involve T10  T8 Metastasis/moderate to severe canal stenosis. CT chest abdomen pelvis shows no primary source was found.  MRI brain did not show any metastatic disease. Seen by oncology, they recommended decompression surgery for which patient refused initially but eventually agreed and underwent surgical decompression by neurosurgery on 03/25/2024, surgical pathology report pending.  Doing well postoperatively.  Neurosurgery recommends supportive care, PT OT, Don brace when out of bed.  Seen by PT OT, CIR recommended.  Medically stable.  Pending placement to CIR now.   History of Melanoma Malignant melanoma of L forearm 03/13/2022    Hypertension Controlled.  Continue Toprol -XL.   Dyslipidemia Lipitor , zetia    CAD Noted, no CP  Prediabetes: Hemoglobin A1c in chart 5 years ago was 5.7.  Will recheck hemoglobin A1c.  Now she is on a steroid starting 03/26/2024 as well.  If hemoglobin A1c elevated, may consider starting on SSI.  DVT prophylaxis: SCD's Start: 03/25/24  2034 Place and maintain sequential compression device Start: 03/24/24 0750   Code Status: Full Code  Family Communication:  None present at bedside.  Plan of care discussed with patient in length and he/she verbalized understanding and agreed with it.  Status is: Inpatient Remains inpatient appropriate because: To be seen by PT OT.  Patient prefers to go to SNF as she lives alone.   Estimated body mass index is 33.62 kg/m as calculated from the following:   Height as of this encounter: 5' 1 (1.549 m).   Weight as of this encounter: 80.7 kg.    Nutritional Assessment: Body mass index is 33.62 kg/m.[name] Seen by dietician.  I agree with the assessment and plan as outlined below: Nutrition Status:        . Skin Assessment: I have examined the patient's skin and I agree with the wound assessment as performed by the wound care RN as outlined below:    Consultants:  Neurosurgery and oncology  Procedures:  As above  Antimicrobials:  Anti-infectives (From admission, onward)    Start     Dose/Rate Route Frequency Ordered Stop   03/25/24 2300  ceFAZolin  (ANCEF ) IVPB 2g/100 mL premix        2 g 200 mL/hr over 30 Minutes Intravenous Every 8 hours 03/25/24 2033 03/26/24 0741         Subjective: Patient seen and examined.  No complaints.  She is happy with the progress and improvement that she has noticed.  Objective: Vitals:   03/26/24 2045 03/27/24 0000 03/27/24 0327 03/27/24 0355  BP: (!) 142/60 (!) 159/66 (!) 150/70 (!) 144/64  Pulse:  64 (!) 58 61 (!) 59  Resp:  18 18 17   Temp:  98.5 F (36.9 C) 97.6 F (36.4 C) 98.8 F (37.1 C)  TempSrc:  Oral Oral Oral  SpO2: 93% 95% 94% 95%  Weight:      Height:        Intake/Output Summary (Last 24 hours) at 03/27/2024 0754 Last data filed at 03/27/2024 0618 Gross per 24 hour  Intake 1496.04 ml  Output 1950 ml  Net -453.96 ml    Filed Weights   03/21/24 1158 03/25/24 1242  Weight: 80.7 kg 80.7 kg     Examination:  General exam: Appears calm and comfortable  Respiratory system: Clear to auscultation. Respiratory effort normal. Cardiovascular system: S1 & S2 heard, RRR. No JVD, murmurs, rubs, gallops or clicks.  +1 pitting edema bilateral lower extremity. Gastrointestinal system: Abdomen is nondistended, soft and nontender. No organomegaly or masses felt. Normal bowel sounds heard. Central nervous system: Alert and oriented. No focal neurological deficits. Extremities: Symmetric 5 x 5 power. Skin: No rashes, lesions or ulcers.  Psychiatry: Judgement and insight appear normal. Mood & affect appropriate.   Data Reviewed: I have personally reviewed following labs and imaging studies  CBC: Recent Labs  Lab 03/21/24 1331 03/22/24 0443 03/24/24 0457 03/26/24 0439  WBC 8.6 7.5 6.8 10.1  NEUTROABS 6.2  --  4.7 7.8*  HGB 13.1 12.1 13.2 12.0  HCT 39.8 37.2 40.8 36.9  MCV 94.8 96.4 96.7 95.8  PLT 339 302 352 383   Basic Metabolic Panel: Recent Labs  Lab 03/21/24 1331 03/22/24 0443 03/24/24 0457 03/26/24 0439  NA 140 138 136 136  K 3.9 4.0 4.0 4.0  CL 104 104 103 105  CO2 24 25 21* 23  GLUCOSE 107* 122* 138* 153*  BUN 11 10 14 11   CREATININE 0.63 0.72 0.77 0.84  CALCIUM  9.8 9.1 9.3 8.6*   GFR: Estimated Creatinine Clearance: 52.3 mL/min (by C-G formula based on SCr of 0.84 mg/dL). Liver Function Tests: Recent Labs  Lab 03/22/24 0443  AST 22  ALT 14  ALKPHOS 53  BILITOT 0.5  PROT 6.4*  ALBUMIN 3.2*   No results for input(s): LIPASE, AMYLASE in the last 168 hours. No results for input(s): AMMONIA in the last 168 hours. Coagulation Profile: No results for input(s): INR, PROTIME in the last 168 hours. Cardiac Enzymes: No results for input(s): CKTOTAL, CKMB, CKMBINDEX, TROPONINI in the last 168 hours. BNP (last 3 results) No results for input(s): PROBNP in the last 8760 hours. HbA1C: No results for input(s): HGBA1C in the last 72  hours. CBG: No results for input(s): GLUCAP in the last 168 hours. Lipid Profile: No results for input(s): CHOL, HDL, LDLCALC, TRIG, CHOLHDL, LDLDIRECT in the last 72 hours. Thyroid Function Tests: No results for input(s): TSH, T4TOTAL, FREET4, T3FREE, THYROIDAB in the last 72 hours. Anemia Panel: No results for input(s): VITAMINB12, FOLATE, FERRITIN, TIBC, IRON, RETICCTPCT in the last 72 hours. Sepsis Labs: No results for input(s): PROCALCITON, LATICACIDVEN in the last 168 hours.  Recent Results (from the past 240 hours)  Surgical PCR screen     Status: None   Collection Time: 03/24/24  1:09 PM   Specimen: Nasal Mucosa; Nasal Swab  Result Value Ref Range Status   MRSA, PCR NEGATIVE NEGATIVE Final   Staphylococcus aureus NEGATIVE NEGATIVE Final    Comment: (NOTE) The Xpert SA Assay (FDA approved for NASAL specimens in patients 89 years of age and older), is one component of a comprehensive  surveillance program. It is not intended to diagnose infection nor to guide or monitor treatment. Performed at [facility], [address] [place], [place] [address]      Radiology Studies: DG Thoracic Spine 2 View Result Date: 03/25/2024 CLINICAL DATA:  Elective surgery.  T9-10 laminectomy for tumor. EXAM: THORACIC SPINE 2 VIEWS COMPARISON:  MRI thoracic spine 03/21/2024 FINDINGS: Intraoperative fluoroscopy is obtained for surgical control purposes. Fluoroscopy time is recorded at 3 minutes 44 seconds. Dose 70.8 mGy. 5 spot fluoroscopic images are obtained. Spot fluoroscopic images provided demonstrate postoperative changes with posterior rod and screw fixation from T6 through T12, crossing the involved T9 and T10 vertebrae. No anterior subluxations. IMPRESSION: Intraoperative fluoroscopy is obtained for surgical control purposes demonstrating rod and screw fixation of the mid/lower thoracic spine. Electronically Signed   By: Elsie Gravely M.D.    On: 03/25/2024 20:15   DG C-Arm 1-60 Min-No Report Result Date: 03/25/2024 Fluoroscopy was utilized by the requesting physician.  No radiographic interpretation.   DG C-Arm 1-60 Min-No Report Result Date: 03/25/2024 Fluoroscopy was utilized by the requesting physician.  No radiographic interpretation.   DG C-Arm 1-60 Min-No Report Result Date: 03/25/2024 Fluoroscopy was utilized by the requesting physician.  No radiographic interpretation.    Scheduled Meds:  atorvastatin   40 mg Oral QPM   dexamethasone  (DECADRON ) injection  4 mg Intravenous Q6H   Or   dexamethasone   4 mg Oral Q6H   docusate sodium   100 mg Oral BID   ezetimibe   10 mg Oral Daily   gabapentin   100 mg Oral TID   methocarbamol  (ROBAXIN ) injection  500 mg Intravenous Q8H   metoprolol  succinate  50 mg Oral QPM   polyethylene glycol  17 g Oral Daily   senna  1 tablet Oral BID   senna-docusate  2 tablet Oral QHS   sodium chloride  flush  3 mL Intravenous Q12H   Continuous Infusions:  0.9 % NaCl with KCl 20 mEq / L 75 mL/hr at 03/27/24 9386     LOS: 5 days   Fredia Skeeter, MD [facility]  03/27/2024, 7:54 AM   *Please note that this is a verbal dictation therefore any spelling or grammatical errors are due to the Dragon Medical One system interpretation.  Please page via Amion and do not message via secure chat for urgent patient care matters. Secure chat can be used for non urgent patient care matters.  How to contact the [facility] Attending or Consulting provider 7A - 7P or covering provider during after hours 7P -7A, for this patient?  Check the care team in [facility] and look for a) attending/consulting [facility] provider listed and b) the [facility] team listed. Page or secure chat 7A-7P. Log into [email or web address] and use [facility] universal password to access. If you do not have the password, please contact the hospital operator. Locate the [facility] provider you are looking for under [facility] and page to a number that  you can be directly reached. If you still have difficulty reaching the provider, please page the [facility] (Director on Call) for the Hospitalists listed on amion for assistance.

## 2024-03-27 NOTE — Progress Notes (Signed)
 Physical Therapy Treatment Patient Details Name: Lynn Hogan MRN: [id number] DOB: 02/28/1946 Today's Date: 03/27/2024   History of Present Illness Pt is a 78 y.o. female presenting 9/22 with progressive back pain. 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 S/p decompressive thoracic laminectomy/medial facetectomy, foraminotomy T8-11 with posterolateral arthridesis T7-12, allograft, segmental fixation T7-12. 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 Pt states she is having moderate-severe back pain but was worse overnight. Pt progressing to hallway-distance gait this date, overall requiring light steadying assist and RW management. Pt has most difficulty with sit<>stands, especially with transitioning hands from support surface<>RW. Pt tolerating repeated transfers to practice this skill well. Pt remains a good rehab candidate, will continue to follow.      If plan is discharge home, recommend the following: A lot of help with bathing/dressing/bathroom;Help with stairs or ramp for entrance;Assist for transportation;A little help with walking and/or transfers   Can travel by private vehicle        Equipment Recommendations  None recommended by PT (defer to next venue)    Recommendations for Other Services       Precautions / Restrictions Precautions Precautions: Back Precaution Booklet Issued: Yes (comment) Recall of Precautions/Restrictions: Impaired Precaution/Restrictions Comments: handout administered and reviewed Required Braces or Orthoses: Spinal Brace Spinal Brace: Thoracolumbosacral orthotic;Applied in sitting position Restrictions Weight Bearing Restrictions Per Provider Order: No     Mobility  Bed Mobility Overal bed  mobility: Needs Assistance             General bed mobility comments: up in chair    Transfers Overall transfer level: Needs assistance Equipment used: Rolling walker (2 wheels) Transfers: Sit to/from Stand Sit to Stand: Min assist           General transfer comment: assist for rise and steady, steadying especially when transitioning hands from support surface<>RW. stand x3, from recliner x2 and from toilet x1.    Ambulation/Gait Ambulation/Gait assistance: Min assist Gait Distance (Feet): 100 Feet Assistive device: Rolling walker (2 wheels) Gait Pattern/deviations: Decreased stride length, Trunk flexed, Step-through pattern Gait velocity: decr     General Gait Details: assist to steady, guide RW especially during turns, cues for upright posture and positioning in RW   Stairs             Wheelchair Mobility     Tilt Bed    Modified Rankin (Stroke Patients Only)       Balance Overall balance assessment: Needs assistance Sitting-balance support: Feet supported Sitting balance-Leahy Scale: Fair Sitting balance - Comments: benefits from at least one UE support   Standing balance support: Bilateral upper extremity supported, Reliant on assistive device for balance Standing balance-Leahy Scale: Poor Standing balance comment: reliant on device                            Communication Communication Communication: No apparent difficulties  Cognition Arousal: Alert Behavior During Therapy: WFL for tasks assessed/performed   PT - Cognitive impairments: No apparent impairments                         Following commands: Intact      Cueing Cueing Techniques: Verbal cues, Gestural cues,  Tactile cues  Exercises      General Comments General comments (skin integrity, edema, etc.): hemovac clipped to gown during mobility      Pertinent Vitals/Pain Pain Assessment Pain Assessment: 0-10 Pain Score: 7  Pain Location: back Pain  Descriptors / Indicators: Aching, Discomfort, Operative site guarding Pain Intervention(s): Limited activity within patient's tolerance, Monitored during session, Repositioned    Home Living                          Prior Function            PT Goals (current goals can now be found in the care plan section) Acute Rehab PT Goals Patient Stated Goal: get better, go to rehab, and go home PT Goal Formulation: With patient Time For Goal Achievement: 04/09/24 Potential to Achieve Goals: Good Progress towards PT goals: Progressing toward goals    Frequency    Min 5X/week      PT Plan      Co-evaluation              AM-PAC PT 6 Clicks Mobility   Outcome Measure  Help needed turning from your back to your side while in a flat bed without using bedrails?: A Little Help needed moving from lying on your back to sitting on the side of a flat bed without using bedrails?: A Lot Help needed moving to and from a bed to a chair (including a wheelchair)?: A Lot Help needed standing up from a chair using your arms (e.g., wheelchair or bedside chair)?: A Little Help needed to walk in hospital room?: A Little Help needed climbing 3-5 steps with a railing? : Total 6 Click Score: 14    End of Session Equipment Utilized During Treatment: Back brace Activity Tolerance: Patient tolerated treatment well Patient left: in chair;with call bell/phone within reach;with family/visitor present;Other (comment) (pt up in chair with chair alarm off upon PT arrival to room, pt knows to wait for staff assist prior to mobilizing, daughter and RN in room) Nurse Communication: Mobility status PT Visit Diagnosis: Unsteadiness on feet (R26.81);Muscle weakness (generalized) (M62.81);Difficulty in walking, not elsewhere classified (R26.2)     Time: 8784-8750 PT Time Calculation (min) (ACUTE ONLY): 34 min  Charges:    $Gait Training: 8-22 mins $Therapeutic Activity: 8-22 mins PT General  Charges $$ ACUTE PT VISIT: 1 Visit                     Johana RAMAN, PT DPT Acute Rehabilitation Services Secure Chat Preferred  Office [phone]    [name] Stroup 03/27/2024, 1:50 PM

## 2024-03-28 DIAGNOSIS — G9589 Other specified diseases of spinal cord: Secondary | ICD-10-CM | POA: Diagnosis not present

## 2024-03-28 LAB — BPAM RBC
Blood Product Expiration Date: 202510102359
Blood Product Expiration Date: 202510172359
ISSUE DATE / TIME: 202509230041
Unit Type and Rh: 6200
Unit Type and Rh: 6200

## 2024-03-28 LAB — TYPE AND SCREEN
ABO/RH(D): A POS
Antibody Screen: NEGATIVE
Unit division: 0
Unit division: 0

## 2024-03-28 NOTE — Progress Notes (Signed)
 PT Cancellation Note  Patient Details Name: Lynn Hogan MRN: [id number] DOB: 1946/04/17   Cancelled Treatment:    Reason Eval/Treat Not Completed: (P) Patient declined, no reason specified (Pt reports just returning to bed from sitting in the recliner and using the bathroom. Pt requests to rest at this time and asks that therapy return tomorrow. Will continue to follow per PT POC.)   [name] 03/28/2024, 2:01 PM

## 2024-03-28 NOTE — TOC Progression Note (Signed)
 Transition of Care [facility]) - Progression Note    Patient Details  Name: Lynn Hogan MRN: [id number] Date of Birth: [birth year]  Transition of Care [facility]) CM/SW Contact  Andrez JULIANNA George, RN Phone Number: 03/28/2024, 1:40 PM  Clinical Narrative:     Pt is s/p surgery. Awaiting pathology results.  CIR following for when she is medically ready.  IP Care management following.  Expected Discharge Plan: IP Rehab Facility Barriers to Discharge: Continued Medical Work up               Expected Discharge Plan and Services   Discharge Planning Services: CM Consult   Living arrangements for the past 2 months: Single Family Home                                       Social Drivers of Health (SDOH) Interventions SDOH Screenings   Food Insecurity: No Food Insecurity (03/22/2024)  Housing: Low Risk  (03/22/2024)  Transportation Needs: No Transportation Needs (03/22/2024)  Utilities: Not At Risk (03/22/2024)  Financial Resource Strain: Low Risk  (08/01/2018)  Physical Activity: Sufficiently Active (08/01/2018)  Social Connections: Socially Isolated (03/22/2024)  Stress: No Stress Concern Present (08/01/2018)  Tobacco Use: Low Risk  (03/25/2024)    Readmission Risk Interventions     No data to display

## 2024-03-28 NOTE — Care Management Important Message (Signed)
 Important Message  Patient Details  Name: Lynn Hogan MRN: [id number] Date of Birth: [birth year]   Important Message Given:  Yes - Medicare IM     Claretta Deed 03/28/2024, 3:28 PM

## 2024-03-28 NOTE — Progress Notes (Signed)
 PROGRESS NOTE    Lynn Hogan  FMW:[id number] DOB: 1946-01-13 DOA: 03/21/2024 PCP: Joshua Franky Sharper, PA-C   Brief Narrative:  Lynn Hogan is a 78 y.o. 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  MRI showed metastatic disease at T9 and T10 as well as T8.  Seen by neurosurgery and oncology.  Assessment & Plan:   Principal Problem:   Spinal cord mass (HCC) Active Problems:   Hyperlipidemia   CAD (coronary artery disease)   Bone lesion   History of heart failure   Hypertension   Malignant melanoma of left forearm (HCC)   Status post thoracic spinal fusion  Back Pain due to Metastatic Cancer: Osseus Metastatic Disease at T9 with Extension inferiorly to involve T10  T8 Metastasis/moderate to severe canal stenosis. CT chest abdomen pelvis shows no primary source was found.  MRI brain did not show any metastatic disease. Seen by oncology, they recommended decompression surgery for which patient refused initially but eventually agreed and underwent surgical decompression by neurosurgery on 03/25/2024, surgical pathology report pending.  Doing well postoperatively.  Neurosurgery recommends supportive care, PT OT, Don brace when out of bed.  Drain removed 03/28/2024. seen by PT OT, CIR recommended.  Medically stable.  Pending placement to CIR now.   History of Melanoma Malignant melanoma of L forearm 03/13/2022    Hypertension Controlled.  Continue Toprol -XL.   Dyslipidemia Lipitor , zetia    CAD Noted, no CP  Prediabetes: Hemoglobin A1c in chart 5 years ago was 5.7 and again 5.7 this hospitalization.  No need of SSI.  DVT prophylaxis: SCD's Start: 03/25/24 2034   Code Status: Full Code  Family Communication:  None present  at bedside.  Plan of care discussed with patient in length and he/she verbalized understanding and agreed with it.  Status is: Inpatient Remains inpatient appropriate because: Medically stable, pending placement to CIR.  Estimated body mass index is 33.62 kg/m as calculated from the following:   Height as of this encounter: 5' 1 (1.549 m).   Weight as of this encounter: 80.7 kg.    Nutritional Assessment: Body mass index is 33.62 kg/m.Lynn Hogan Seen by dietician.  I agree with the assessment and plan as outlined below: Nutrition Status:        . Skin Assessment: I have examined the patient's skin and I agree with the wound assessment as performed by the wound care RN as outlined below:    Consultants:  Neurosurgery and oncology  Procedures:  As above  Antimicrobials:  Anti-infectives (From admission, onward)    Start     Dose/Rate Route Frequency Ordered Stop   03/25/24 2300  ceFAZolin  (ANCEF ) IVPB 2g/100 mL premix        2 g 200 mL/hr over 30 Minutes Intravenous Every 8 hours 03/25/24 2033 03/26/24 0741         Subjective: Patient seen and examined.  No new complaint.  Has good strength in extremities.  Objective: Vitals:   03/27/24 1532 03/27/24 2053 03/28/24 0443 03/28/24 1226  BP: (!) 164/72 (!) 158/62 (!) 165/82 (!) 137/56  Pulse: 62 69 (!) 56 62  Resp: 14 19 19 19   Temp: 97.9 F (36.6 C) 97.6 F (36.4 C) 97.7 F (36.5 C) 98.2 F (36.8 C)  TempSrc: Oral Oral Oral Oral  SpO2: 97% 94%  94% 96%  Weight:      Height:        Intake/Output Summary (Last 24 hours) at 03/28/2024 1325 Last data filed at 03/27/2024 2244 Gross per 24 hour  Intake 1720.95 ml  Output 60 ml  Net 1660.95 ml    Filed Weights   03/21/24 1158 03/25/24 1242  Weight: 80.7 kg 80.7 kg    Examination:  General exam: Appears calm and comfortable  Respiratory system: Clear to auscultation. Respiratory effort normal. Cardiovascular system: S1 & S2 heard, RRR. No JVD, murmurs, rubs,  gallops or clicks.  +1 pitting edema bilateral lower extremity. Gastrointestinal system: Abdomen is nondistended, soft and nontender. No organomegaly or masses felt. Normal bowel sounds heard. Central nervous system: Alert and oriented. No focal neurological deficits. Extremities: Symmetric 5 x 5 power. Skin: No rashes, lesions or ulcers.  Psychiatry: Judgement and insight appear normal. Mood & affect appropriate.   Data Reviewed: I have personally reviewed following labs and imaging studies  CBC: Recent Labs  Lab 03/21/24 1331 03/22/24 0443 03/24/24 0457 03/26/24 0439  WBC 8.6 7.5 6.8 10.1  NEUTROABS 6.2  --  4.7 7.8*  HGB 13.1 12.1 13.2 12.0  HCT 39.8 37.2 40.8 36.9  MCV 94.8 96.4 96.7 95.8  PLT 339 302 352 383   Basic Metabolic Panel: Recent Labs  Lab 03/21/24 1331 03/22/24 0443 03/24/24 0457 03/26/24 0439  NA 140 138 136 136  K 3.9 4.0 4.0 4.0  CL 104 104 103 105  CO2 24 25 21* 23  GLUCOSE 107* 122* 138* 153*  BUN 11 10 14 11   CREATININE 0.63 0.72 0.77 0.84  CALCIUM  9.8 9.1 9.3 8.6*   GFR: Estimated Creatinine Clearance: 52.3 mL/min (by C-G formula based on SCr of 0.84 mg/dL). Liver Function Tests: Recent Labs  Lab 03/22/24 0443  AST 22  ALT 14  ALKPHOS 53  BILITOT 0.5  PROT 6.4*  ALBUMIN 3.2*   No results for input(s): LIPASE, AMYLASE in the last 168 hours. No results for input(s): AMMONIA in the last 168 hours. Coagulation Profile: No results for input(s): INR, PROTIME in the last 168 hours. Cardiac Enzymes: No results for input(s): CKTOTAL, CKMB, CKMBINDEX, TROPONINI in the last 168 hours. BNP (last 3 results) No results for input(s): PROBNP in the last 8760 hours. HbA1C: Recent Labs    03/27/24 0834  HGBA1C 5.7*   CBG: No results for input(s): GLUCAP in the last 168 hours. Lipid Profile: No results for input(s): CHOL, HDL, LDLCALC, TRIG, CHOLHDL, LDLDIRECT in the last 72 hours. Thyroid Function Tests: No  results for input(s): TSH, T4TOTAL, FREET4, T3FREE, THYROIDAB in the last 72 hours. Anemia Panel: No results for input(s): VITAMINB12, FOLATE, FERRITIN, TIBC, IRON, RETICCTPCT in the last 72 hours. Sepsis Labs: No results for input(s): PROCALCITON, LATICACIDVEN in the last 168 hours.  Recent Results (from the past 240 hours)  Surgical PCR screen     Status: None   Collection Time: 03/24/24  1:09 PM   Specimen: Nasal Mucosa; Nasal Swab  Result Value Ref Range Status   MRSA, PCR NEGATIVE NEGATIVE Final   Staphylococcus aureus NEGATIVE NEGATIVE Final    Comment: (NOTE) The Xpert SA Assay (FDA approved for NASAL specimens in patients 52 years of age and older), is one component of a comprehensive surveillance program. It is not intended to diagnose infection nor to guide or monitor treatment. Performed at [facility], [address] [place], [place] [address]      Radiology Studies:  No results found.   Scheduled Meds:  atorvastatin   40 mg Oral QPM   dexamethasone  (DECADRON ) injection  4 mg Intravenous Q6H   Or   dexamethasone   4 mg Oral Q6H   docusate sodium   100 mg Oral BID   ezetimibe   10 mg Oral Daily   metoprolol  succinate  50 mg Oral QPM   polyethylene glycol  17 g Oral Daily   senna  1 tablet Oral BID   senna-docusate  2 tablet Oral QHS   sodium chloride  flush  3 mL Intravenous Q12H   Continuous Infusions:  0.9 % NaCl with KCl 20 mEq / L 75 mL/hr at 03/27/24 2244     LOS: 6 days   Fredia Skeeter, MD [facility]  03/28/2024, 1:25 PM   *Please note that this is a verbal dictation therefore any spelling or grammatical errors are due to the Dragon Medical One system interpretation.  Please page via Amion and do not message via secure chat for urgent patient care matters. Secure chat can be used for non urgent patient care matters.  How to contact the [facility] Attending or Consulting provider 7A - 7P or covering provider during  after hours 7P -7A, for this patient?  Check the care team in [facility] and look for a) attending/consulting [facility] provider listed and b) the [facility] team listed. Page or secure chat 7A-7P. Log into [email or web address] and use [place]'s universal password to access. If you do not have the password, please contact the hospital operator. Locate the [facility] provider you are looking for under [facility] and page to a number that you can be directly reached. If you still have difficulty reaching the provider, please page the [facility] (Director on Call) for the Hospitalists listed on amion for assistance.

## 2024-03-28 NOTE — Progress Notes (Signed)
 Patient ID: Lynn Hogan, female   DOB: 1945/10/29, 78 y.o.   MRN: [id number] She is out of bed to chair.  She states she is doing fairly well.  She has appropriate back soreness.  Her legs are doing well.  No numbness tingling or weakness.  She is walking with her brace.  We remove the drain today.  Awaiting pathology.

## 2024-03-29 DIAGNOSIS — C4362 Malignant melanoma of left upper limb, including shoulder: Secondary | ICD-10-CM | POA: Diagnosis not present

## 2024-03-29 DIAGNOSIS — G9589 Other specified diseases of spinal cord: Secondary | ICD-10-CM | POA: Diagnosis not present

## 2024-03-29 MED ORDER — DEXAMETHASONE SODIUM PHOSPHATE 4 MG/ML IJ SOLN
4.0000 mg | Freq: Three times a day (TID) | INTRAMUSCULAR | Status: DC
  Administered 2024-03-29 – 2024-03-31 (×6): 4 mg via INTRAVENOUS
  Filled 2024-03-29 (×6): qty 1

## 2024-03-29 MED ORDER — DEXAMETHASONE 4 MG PO TABS: 4.0000 mg | ORAL_TABLET | Freq: Three times a day (TID) | ORAL | Status: DC

## 2024-03-29 MED ORDER — ACETAMINOPHEN 325 MG PO TABS
650.0000 mg | ORAL_TABLET | Freq: Four times a day (QID) | ORAL | Status: DC | PRN
  Administered 2024-03-29 – 2024-04-01 (×11): 650 mg via ORAL
  Filled 2024-03-29 (×12): qty 2

## 2024-03-29 NOTE — Progress Notes (Signed)
 Physical Therapy Treatment Patient Details Name: Lynn Hogan MRN: [id number] DOB: Apr 06, 1946 Today's Date: 03/29/2024   History of Present Illness Pt is a 78 y.o. female presenting 9/22 with progressive back pain. 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 S/p decompressive thoracic laminectomy/medial facetectomy, foraminotomy T8-11 with posterolateral arthridesis T7-12, allograft, segmental fixation T7-12. 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 She is agreeable to participate in physical therapy session and recalls 3/3 spinal precautions. Pt requires maxA to don TLSO sitting on edge of bed. MinA provided for transfers and ambulating limited hallway distance. Pt with moderate reliance through arms on walker; demonstrates slow pace and guarded posture. Patient will benefit from intensive inpatient follow-up therapy, >3 hours/day to address deficits and maximize functional mobility.    If plan is discharge home, recommend the following: A lot of help with bathing/dressing/bathroom;Help with stairs or ramp for entrance;Assist for transportation;A little help with walking and/or transfers   Can travel by private vehicle        Equipment Recommendations  None recommended by PT    Recommendations for Other Services       Precautions / Restrictions Precautions Precautions: Back;Fall Required Braces or Orthoses: Spinal Brace Spinal Brace: Thoracolumbosacral orthotic;Applied in sitting position Restrictions Weight Bearing Restrictions Per Provider Order: No     Mobility  Bed Mobility Overal bed mobility: Needs Assistance Bed Mobility: Supine to Sit, Sit to Supine     Supine to sit: HOB elevated, Used rails, Contact guard Sit to supine: Min  assist, HOB elevated, Used rails   General bed mobility comments: Pt preference to transition to long sitting and then bringing legs on and off bed    Transfers Overall transfer level: Needs assistance Equipment used: Rolling walker (2 wheels) Transfers: Sit to/from Stand Sit to Stand: Min assist, From elevated surface           General transfer comment: Cues for hand placement, minA to power up to stand    Ambulation/Gait Ambulation/Gait assistance: Min assist Gait Distance (Feet): 105 Feet Assistive device: Rolling walker (2 wheels) Gait Pattern/deviations: Step-through pattern, Decreased stride length Gait velocity: decreased Gait velocity interpretation: <1.8 ft/sec, indicate of risk for recurrent falls   General Gait Details: Verbal cues for monitoring for activity tolerance, scapular depression and retraction, minA for steadying assist   Stairs             Wheelchair Mobility     Tilt Bed    Modified Rankin (Stroke Patients Only)       Balance Overall balance assessment: Needs assistance Sitting-balance support: Feet supported Sitting balance-Leahy Scale: Good Sitting balance - Comments: seated EOB without LOB while engaging in UE dressing using BUE   Standing balance support: Bilateral upper extremity supported, Reliant on assistive device for balance Standing balance-Leahy Scale: Poor Standing balance comment: heavily reliant on RW/surfaces for at least unilateral UE support in stance                            Communication Communication Communication: No apparent difficulties  Cognition Arousal: Alert Behavior During Therapy: WFL for tasks assessed/performed   PT - Cognitive impairments: No apparent impairments  Following commands: Intact      Cueing Cueing Techniques: Verbal cues, Gestural cues, Tactile cues  Exercises      General Comments        Pertinent Vitals/Pain Pain  Assessment Pain Assessment: Faces Faces Pain Scale: Hurts even more Pain Location: back - incision site Pain Descriptors / Indicators: Discomfort Pain Intervention(s): Monitored during session, Patient requesting pain meds-RN notified    Home Living                 Home Layout: Two level;Able to live on main level with bedroom/bathroom        Prior Function            PT Goals (current goals can now be found in the care plan section) Acute Rehab PT Goals Potential to Achieve Goals: Good Progress towards PT goals: Progressing toward goals    Frequency    Min 5X/week      PT Plan      Co-evaluation              AM-PAC PT 6 Clicks Mobility   Outcome Measure  Help needed turning from your back to your side while in a flat bed without using bedrails?: A Little Help needed moving from lying on your back to sitting on the side of a flat bed without using bedrails?: A Little Help needed moving to and from a bed to a chair (including a wheelchair)?: A Little Help needed standing up from a chair using your arms (e.g., wheelchair or bedside chair)?: A Little Help needed to walk in hospital room?: A Little Help needed climbing 3-5 steps with a railing? : A Lot 6 Click Score: 17    End of Session Equipment Utilized During Treatment: Gait belt;Back brace Activity Tolerance: Patient tolerated treatment well Patient left: in bed;with call bell/phone within reach;with bed alarm set Nurse Communication: Mobility status;Patient requests pain meds PT Visit Diagnosis: Unsteadiness on feet (R26.81);Muscle weakness (generalized) (M62.81);Difficulty in walking, not elsewhere classified (R26.2)     Time: 8477-8449 PT Time Calculation (min) (ACUTE ONLY): 28 min  Charges:    $Therapeutic Activity: 23-37 mins PT General Charges $$ ACUTE PT VISIT: 1 Visit                     Aleck Daring, PT, DPT Acute Rehabilitation Services Office [phone]    Aleck ONEIDA Daring 03/29/2024, 4:28 PM

## 2024-03-29 NOTE — PMR Pre-admission (Shared)
 PMR Admission Coordinator Pre-Admission Assessment  Patient: Lynn Hogan is an 78 y.o., female MRN: [id number] DOB: 1945-12-25 Height: 5' 1 (154.9 cm) Weight: 80.7 kg  Insurance Information HMO: yes    PPO:      PCP:      IPA:      80/20:      OTHER:  PRIMARY: [employer]       Policy#: [id number]      Subscriber: patient CM Name: Edsel      Phone#: [phone]     Fax#: [phone] or [phone] Pre-Cert#: [id number] auth for CIR from Danielle with expedited appeals dept for admit 10/3 with next review date 10/9.  Updates due to fax listed above.        Employer:  Benefits:  Phone #: [phone]   Name:  Eff. Date: 07/01/23     Deduct: $0      Out of Pocket Max: $4000 (met $1264)      Life Max:  CIR: $160/day for days 1-10      SNF: 20 full days  Outpatient:      Co-Pay: $20/visit Home Health: 100%      Co-Pay:  DME: 80%%     Co-Pay: 20% Providers: in-network SECONDARY:       Policy#:      Phone#:   Artist:       Phone#:   The Data processing manager" for patients in Inpatient Rehabilitation Facilities with attached "Privacy Act Statement-Health Care Records" was provided and verbally reviewed with: Patient  Emergency Contact Information Contact Information     Name Relation Home Work Mobile   Daye,Julie Daughter   [phone]      Other Contacts     Name Relation Home Work Mobile   Meyers,Cari Daughter   [phone]       Current Medical History  Patient Admitting Diagnosis: 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  MRI showed metastatic disease at T9-10, consistent with metastatic melanoma with extraosseous extension into the canal, paraspinal soft tissues, and T9-10 foramina which results in moderate to  severe canal stenosis at T9.  Neurosurgery consulted and pt underwent a T8-11 decompression with T7-12 PLIF per Dr. Joshua on 03/25/24.  Post op course reduced bowel motility with constipation.  Therapy ongoing and pt was recommended for CIR.     Patient's medical record from [facility] has been reviewed by the rehabilitation admission coordinator and physician.  Past Medical History  Past Medical History:  Diagnosis Date   Coronary artery disease    Hyperlipidemia    NSTEMI (non-ST elevated myocardial infarction) (HCC)    08/02/2018-PCI/DES to RCA    STEMI (ST elevation myocardial infarction) (HCC)    08/08/2018-PCI/DES to mLAD, 85% osital stenosis of 1st diag.    Systolic heart failure (HCC)     Has the patient had major surgery during 100 days prior to admission? Yes  Family History   family history includes Brain cancer in her mother; Heart attack (age of onset: 3) in her father; Lung cancer in her mother; Microcephaly in her mother.  Current Medications  Current Facility-Administered Medications:    acetaminophen  (TYLENOL ) tablet 650 mg, 650 mg, Oral, Q6H PRN, Vernon Ranks, MD, 650 mg at 03/31/24 0334   atorvastatin  (LIPITOR )  tablet 40 mg, 40 mg, Oral, QPM, [name], MD, 40 mg at 03/30/24 1731   bisacodyl  (DULCOLAX) suppository 10 mg, 10 mg, Rectal, Daily PRN, [name], MD   dexamethasone  (DECADRON ) injection 2 mg, 2 mg, Intravenous, Q6H **OR** dexamethasone  (DECADRON ) tablet 2 mg, 2 mg, Oral, Q6H, [name], MD, 2 mg at 03/31/24 1323   docusate sodium  (COLACE) capsule 100 mg, 100 mg, Oral, BID, [name], MD, 100 mg at 03/31/24 9170   ezetimibe  (ZETIA ) tablet 10 mg, 10 mg, Oral, Daily, [name], MD, 10 mg at 03/31/24 9170   hydrALAZINE  (APRESOLINE ) injection 10 mg, 10 mg, Intravenous, Q6H PRN, Vernon Ranks, MD, 10 mg at 03/30/24 2352   lidocaine  (XYLOCAINE ) 2 % jelly 1 Application, 1 Application, Topical, Once, Pahwani, Ranks,  MD   menthol  (CEPACOL) lozenge 3 mg, 1 lozenge, Oral, PRN **OR** phenol (CHLORASEPTIC) mouth spray 1 spray, 1 spray, Mouth/Throat, PRN, [name], MD   methocarbamol  (ROBAXIN ) tablet 500 mg, 500 mg, Oral, Q6H PRN **OR** methocarbamol  (ROBAXIN ) injection 500 mg, 500 mg, Intravenous, Q6H PRN, [name], MD   metoprolol  succinate (TOPROL -XL) 24 hr tablet 50 mg, 50 mg, Oral, QPM, [name], MD, 50 mg at 03/30/24 1731   naloxegol oxalate (MOVANTIK) tablet 12.5 mg, 12.5 mg, Oral, Daily, [name], MD, 12.5 mg at 03/31/24 1048   ondansetron  (ZOFRAN ) tablet 4 mg, 4 mg, Oral, Q6H PRN, 4 mg at 03/31/24 1449 **OR** ondansetron  (ZOFRAN ) injection 4 mg, 4 mg, Intravenous, Q6H PRN, [name], MD, 4 mg at 03/31/24 9342   oxyCODONE  (Oxy IR/ROXICODONE ) immediate release tablet 5 mg, 5 mg, Oral, Q4H PRN, Pahwani, Ravi, MD, 5 mg at 03/31/24 1449   polyethylene glycol (MIRALAX  / GLYCOLAX ) packet 17 g, 17 g, Oral, Daily, [name], MD, 17 g at 03/31/24 9170   senna (SENOKOT) tablet 8.6 mg, 1 tablet, Oral, BID, [name], MD, 8.6 mg at 03/31/24 9170   senna-docusate (Senokot-S) tablet 2 tablet, 2 tablet, Oral, QHS, [name], MD, 2 tablet at 03/29/24 2120   sodium chloride  flush (NS) 0.9 % injection 3 mL, 3 mL, Intravenous, Q12H, [name], MD, 3 mL at 03/31/24 9165   sodium chloride  flush (NS) 0.9 % injection 3 mL, 3 mL, Intravenous, PRN, [name], MD  Patients Current Diet:  Diet Order             Diet regular Room service appropriate? Yes; Fluid consistency: Thin  Diet effective now                   Precautions / Restrictions Precautions Precautions: Back, Fall Precaution Booklet Issued: Yes (comment) Precaution/Restrictions Comments: handout administered and reviewed Spinal Brace: Thoracolumbosacral orthotic, Applied in sitting position Restrictions Weight Bearing Restrictions Per Provider Order: No   Has the  patient had 2 or more falls or a fall with injury in the past year? No  Prior Activity Level Community (5-7x/wk): mod I with rollator at baseline, doesn't drive, family takes to appointments and she uses [employer]ac[employer] for groceries  Prior Functional Level Self Care: Did the patient need help bathing, dressing, using the toilet or eating? Independent  Indoor Mobility: Did the patient need assistance with walking from room to room (with or without device)? Independent  Stairs: Did the patient need assistance with internal or external stairs (with or without device)? Independent  Functional Cognition: Did the patient need help planning regular tasks such as  shopping or remembering to take medications? Independent  Patient Information Are you of Hispanic, Latino/a,or Spanish origin?: A. No, not of Hispanic, Latino/a, or Spanish origin What is your race?: A. White Do you need or want an interpreter to communicate with a doctor or health care staff?: 0. No  Patient's Response To:  Health Literacy and Transportation Is the patient able to respond to health literacy and transportation needs?: Yes Health Literacy - How often do you need to have someone help you when you read instructions, pamphlets, or other written material from your doctor or pharmacy?: Never In the past 12 months, has lack of transportation kept you from medical appointments or from getting medications?: No In the past 12 months, has lack of transportation kept you from meetings, work, or from getting things needed for daily living?: No  Journalist, newspaper / Equipment Home Equipment: Information systems manager, Rollator (4 wheels), Cane - quad  Prior Device Use: Indicate devices/aids used by the patient prior to current illness, exacerbation or injury? Walker/rollator  Current Functional Level Cognition  Orientation Level: Oriented X4    Extremity Assessment (includes Sensation/Coordination)  Upper Extremity Assessment: Right hand  dominant  Lower Extremity Assessment: Generalized weakness    ADLs  Overall ADL's : Needs assistance/impaired Eating/Feeding: Set up, Sitting Grooming: Contact guard assist, Standing, Wash/dry hands Grooming Details (indicate cue type and reason): occasionally leaning on forearms due to fatigue despite OT cues to maintain precautions standing sinkside Upper Body Bathing: Minimal assistance, Sitting Lower Body Bathing: Maximal assistance, Sit to/from stand Upper Body Dressing : Moderate assistance, Sitting Upper Body Dressing Details (indicate cue type and reason): don/doff TLSO Lower Body Dressing: Supervision/safety, Cueing for compensatory techniques, Cueing for back precautions, Sitting/lateral leans Lower Body Dressing Details (indicate cue type and reason): initiated AE teaching, pt able to manage socks & underwear via figure four technique at EOB Toilet Transfer: Contact guard assist, Ambulation, Rolling walker (2 wheels), Cueing for safety Toilet Transfer Details (indicate cue type and reason): cued for safe approach and widening BOS for improved body mechanics Toileting- Clothing Manipulation and Hygiene: Supervision/safety, Cueing for compensatory techniques, Cueing for back precautions, Sitting/lateral lean, Sit to/from stand Toileting - Clothing Manipulation Details (indicate cue type and reason): pt often leaning on forearms due to worsening back pain/transient nausea despite OT cues to maintain upright posture/neutral spine Functional mobility during ADLs: Moderate assistance, +2 for safety/equipment, Rolling walker (2 wheels)    Mobility  Overal bed mobility: Needs Assistance Bed Mobility: Supine to Sit, Sit to Supine Supine to sit: HOB elevated, Used rails, Contact guard Sit to supine: Contact guard assist, HOB elevated, Used rails General bed mobility comments: Inc effort to bring L>RLE into bed for sit>supine, pt preferring to transition to long sitting then turning to face  EOB rather than traditional log roll technique    Transfers  Overall transfer level: Needs assistance Equipment used: Rolling walker (2 wheels) Transfers: Sit to/from Stand Sit to Stand: Contact guard assist General transfer comment: Inc effort & multiple attempts to come to standing, improved form with wBOS, extensive cues for hand placement    Ambulation / Gait / Stairs / Wheelchair Mobility  Ambulation/Gait Ambulation/Gait assistance: Contact guard assist Gait Distance (Feet): 150 Feet Assistive device: Rolling walker (2 wheels) Gait Pattern/deviations: Step-through pattern, Decreased stride length General Gait Details: Slow, guarded gait. Cues for upright posture and shoulder depression. CGA for safety, but no overt LOB Gait velocity: decreased Gait velocity interpretation: <1.8 ft/sec, indicate of risk for recurrent falls  Posture / Balance Dynamic Sitting Balance Sitting balance - Comments: EOB; intermittently reliant on BUE for support due to fluctuating pain Balance Overall balance assessment: Needs assistance Sitting-balance support: Feet supported, Single extremity supported Sitting balance-Leahy Scale: Good Sitting balance - Comments: EOB; intermittently reliant on BUE for support due to fluctuating pain Standing balance support: Bilateral upper extremity supported, Reliant on assistive device for balance, During functional activity Standing balance-Leahy Scale: Poor Standing balance comment: reliant on RW; seldom able to remove one UE from RW/sink to open doors, perform grooming, etc.    Special considerations/life events  Skin Surgical Incision/back and External Urinary Catheter   Previous Home Environment (from acute therapy documentation) Living Arrangements: Alone Available Help at Discharge: Family, Available PRN/intermittently Type of Home: Other(Comment) (town home) Home Layout: Two level, Able to live on main level with bedroom/bathroom Home Access: Stairs  to enter [employer] of Steps: 1 Bathroom Shower/Tub: Health visitor: Standard (has Training and development officer) Bathroom Accessibility: Yes How Accessible: Accessible via walker Home Care Services: No Additional Comments: plans to sleep in recliner at home  Discharge Living Setting Plans for Discharge Living Setting: Patient's home Type of Home at Discharge: Other (Comment) (townhome) Discharge Home Layout: Two level, Able to live on main level with bedroom/bathroom Discharge Home Access: Stairs to enter Entrance Stairs-Rails: None Entrance Stairs-Number of Steps: 1 Discharge Bathroom Shower/Tub: Walk-in shower Discharge Bathroom Toilet: Standard Discharge Bathroom Accessibility: Yes How Accessible: Accessible via walker Does the patient have any problems obtaining your medications?: No  Social/Family/Support Systems Anticipated Caregiver: [name] and Kirk (daughters Anticipated Caregiver's Contact Information: [name] [phone] Ability/Limitations of Caregiver: [name] and Kirk work FT.  Julie's schedule is somewhat flexible, and Cari works M-Th.  Can provide intermittent support outside of work hours Caregiver Availability: Intermittent Discharge Plan Discussed with Primary Caregiver: Yes Is Caregiver In Agreement with Plan?: Yes Does Caregiver/Family have Issues with Lodging/Transportation while Pt is in Rehab?: No  Goals Patient/Family Goal for Rehab: PT/OT mod I, SLP n/a Expected length of stay: 7-10 days Pt/Family Agrees to Admission and willing to participate: Yes Program Orientation Provided & Reviewed with Pt/Caregiver Including Roles  & Responsibilities: Yes  Decrease burden of Care through IP rehab admission: NA  Possible need for SNF placement upon discharge: Not anticipated  Patient Condition: This patient's condition remains as documented in the consult dated 03/29/24, in which the Rehabilitation Physician determined and documented that the patient's  condition is appropriate for intensive rehabilitative care in an inpatient rehabilitation facility. Will admit to inpatient rehab today.  Preadmission Screen Completed By:  Reche Lowers, PT, DPT and Tinnie Yvone Cohens, 03/31/2024 3:35 PM ______________________________________________________________________   Discussed status with Dr. Babs on 04/01/24  at 1:15 PM  and received approval for admission today.  Admission Coordinator: Reche Lowers, PT, DPT and Braysen Cloward SHAUNNA Yvone Cohens, CCC-SLP, time 1:15 PM Pattricia 04/01/24    Assessment/Plan: Diagnosis: *** Does the need for close, 24 hr/day Medical supervision in concert with the patient's rehab needs make it unreasonable for this patient to be served in a less intensive setting? {yes_no_potentially:3041433} Co-Morbidities requiring supervision/potential complications: *** Due to {due un:6958565}, does the patient require 24 hr/day rehab nursing? {yes_no_potentially:3041433} Does the patient require coordinated care of a physician, rehab nurse, PT, OT, and SLP to address physical and functional deficits in the context of the above medical diagnosis(es)? {yes_no_potentially:3041433} Addressing deficits in the following areas: {deficits:3041436} Can the patient actively participate in an intensive therapy program of at least 3 hrs of therapy 5  days a week? {yes_no_potentially:3041433} The potential for patient to make measurable gains while on inpatient rehab is {potential:3041437} Anticipated functional outcomes upon discharge from inpatient rehab: {functional outcomes:304600100} PT, {functional outcomes:304600100} OT, {functional outcomes:304600100} SLP Estimated rehab length of stay to reach the above functional goals is: *** Anticipated discharge destination: {anticipated dc setting:21604} 10. Overall Rehab/Functional Prognosis: {potential:3041437}   MD Signature: ***

## 2024-03-29 NOTE — Anesthesia Postprocedure Evaluation (Signed)
 Anesthesia Post Note  Patient: Lynn Hogan  Procedure(s) Performed: THORACIC NINE -THORACIC TEN LAMINECTOMY FOR TUMOR, INSTRUMENTED FUSION THORACIC SEVEN-THORACIC TWELVE     Patient location during evaluation: PACU Anesthesia Type: General Level of consciousness: awake and alert Pain management: pain level controlled Vital Signs Assessment: post-procedure vital signs reviewed and stable Respiratory status: spontaneous breathing, nonlabored ventilation, respiratory function stable and patient connected to nasal cannula oxygen Cardiovascular status: blood pressure returned to baseline and stable Postop Assessment: no apparent nausea or vomiting Anesthetic complications: no   No notable events documented.          Lynwood MARLA Cornea

## 2024-03-29 NOTE — Progress Notes (Signed)
 Inpatient Rehab Admissions:  Inpatient Rehab Consult received.  I met with patient at the bedside for rehabilitation assessment and to discuss goals and expectations of an inpatient rehab admission.  Discussed average length of stay, insurance authorization requirement and discharge home after completion of CIR. Pt acknowledged understanding and is interested in pursuing CIR. Pt informed AC she will have intermittent support after discharge. Requested formal MD consult. Will continue to follow.  Signed: Tinnie Yvone Cohens, MS, CCC-SLP Admissions Coordinator [phone]

## 2024-03-29 NOTE — Progress Notes (Signed)
 Occupational Therapy Treatment Patient Details Name: Lynn Hogan MRN: [id number] DOB: May 21, 1946 Today's Date: 03/29/2024   History of present illness Pt is a 78 y.o. female presenting 9/22 with progressive back pain. 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 S/p decompressive thoracic laminectomy/medial facetectomy, foraminotomy T8-11 with posterolateral arthridesis T7-12, allograft, segmental fixation T7-12. 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 RN pre-medicated prior to OT arrival. Pt progressed well towards functional goals today, [facility] 16/24. She required up to mod A for TLSO mgmt (donning/doffing) and mod A for LB dressing seated upright in bed via modified technique. Would benefit from adaptive equipment trial. She remains heavily reliant on at least unilateral UE support via RW and/or surfaces in room for stability while engaged in standing ADLs (ie peri care and grooming). Returned to bed at end of visit, tolerated session well with minimal fatigue. Pt is progressing well, OT to continue to follow.      If plan is discharge home, recommend the following:  A little help with walking and/or transfers;A lot of help with bathing/dressing/bathroom;Assist for transportation;Help with stairs or ramp for entrance   Equipment Recommendations  Other (comment) (defer)    Recommendations for Other Services      Precautions / Restrictions Precautions Precautions: Back;Fall Required Braces or Orthoses: Spinal Brace Spinal Brace: Thoracolumbosacral orthotic;Applied in sitting position Restrictions Weight Bearing Restrictions Per Provider Order: No       Mobility Bed Mobility Overal bed mobility: Needs Assistance Bed Mobility: Supine to Sit, Sit to  Supine     Supine to sit: Min assist, HOB elevated, Used rails Sit to supine: Min assist, HOB elevated, Used rails   General bed mobility comments: Assist for trunk elevation and LLE mgmt for sit>supine, cueing for log roll technique with pt poorly compliant 2/2 she prefers to transition to long sitting then scooting on bottom to bring LE's into bed.    Transfers Overall transfer level: Needs assistance Equipment used: Rolling walker (2 wheels) Transfers: Sit to/from Stand Sit to Stand: Min assist, From elevated surface           General transfer comment: Cued for hand placement for optimal body mechanics and maintaining spine precautions.     Balance Overall balance assessment: Needs assistance Sitting-balance support: Feet supported Sitting balance-Leahy Scale: Good Sitting balance - Comments: seated EOB without LOB while engaging in UE dressing using BUE   Standing balance support: Bilateral upper extremity supported, Reliant on assistive device for balance Standing balance-Leahy Scale: Poor Standing balance comment: heavily reliant on RW/surfaces for at least unilateral UE support in stance                           ADL either performed or assessed with clinical judgement   ADL Overall ADL's : Needs assistance/impaired     Grooming: Contact guard assist;Wash/dry hands;Standing Grooming Details (indicate cue type and reason): pt heavily reliant on at least unilateral UE support via sink vanity during grooming, cued to avoid forward thoracic flexion while standing to comply with precautions         Upper Body Dressing : Moderate assistance;Sitting Upper Body Dressing Details (indicate cue type and reason): donning TLSO brace, cues for sequencing and appropriate fit/adjustments Lower Body Dressing: Moderate assistance;Cueing  for compensatory techniques;Cueing for back precautions;Sitting/lateral leans;Bed level Lower Body Dressing Details (indicate cue type  and reason): long sitting in bed via modified figure four technique, OT initiating threading socks over B feet Toilet Transfer: Minimal assistance;Cueing for safety;Ambulation;Regular Toilet;Rolling walker (2 wheels) Toilet Transfer Details (indicate cue type and reason): cues for controlled descent, maintaining precautions, and maintaining wBOS for improved body mechanics Toileting- Clothing Manipulation and Hygiene: Contact guard assist;Cueing for compensatory techniques;Cueing for back precautions;Sit to/from stand;Sitting/lateral lean Toileting - Clothing Manipulation Details (indicate cue type and reason): anterior peri care and applying barrier cream in stance            Extremity/Trunk Assessment              Vision       Perception     Praxis     Communication Communication Communication: No apparent difficulties   Cognition Arousal: Alert Behavior During Therapy: WFL for tasks assessed/performed Cognition: No apparent impairments             OT - Cognition Comments: good recall of precautions, benefits from occasional teaching cues to maintain and implement during session                 Following commands: Intact        Cueing   Cueing Techniques: Verbal cues, Gestural cues, Tactile cues  Exercises      Shoulder Instructions       General Comments incision hidden beneath post-op dressing    Pertinent Vitals/ Pain       Pain Assessment Pain Assessment: 0-10 Pain Score: 6  Pain Location: back - incision site Pain Descriptors / Indicators: Discomfort Pain Intervention(s): Limited activity within patient's tolerance, Monitored during session, Premedicated before session, Repositioned  Home Living                                          Prior Functioning/Environment              Frequency  Min 2X/week        Progress Toward Goals  OT Goals(current goals can now be found in the care plan section)  Progress  towards OT goals: Progressing toward goals     Plan      Co-evaluation                 AM-PAC OT 6 Clicks Daily Activity     Outcome Measure   Help from another person eating meals?: None Help from another person taking care of personal grooming?: A Little Help from another person toileting, which includes using toliet, bedpan, or urinal?: A Little Help from another person bathing (including washing, rinsing, drying)?: A Lot Help from another person to put on and taking off regular upper body clothing?: A Lot Help from another person to put on and taking off regular lower body clothing?: A Lot 6 Click Score: 16    End of Session Equipment Utilized During Treatment: Gait belt;Rolling walker (2 wheels);Back brace  OT Visit Diagnosis: Unsteadiness on feet (R26.81);Muscle weakness (generalized) (M62.81);Pain Pain - part of body:  (back)   Activity Tolerance Patient tolerated treatment well   Patient Left in bed;with call bell/phone within reach;with nursing/sitter in room   Nurse Communication Mobility status        Time: 8871-8840 OT Time Calculation (min): 31 min  Charges: OT General Charges $OT Visit:  1 Visit OT Treatments $Self Care/Home Management : 23-37 mins  Dava Rensch D., MSOT, OTR/L Acute Rehabilitation Services [phone] Secure Chat Preferred  Lynn Hogan 03/29/2024, 12:11 PM

## 2024-03-29 NOTE — Progress Notes (Signed)
 Patient ID: Lynn Hogan, female   DOB: Feb 05, 1946, 78 y.o.   MRN: [id number] Looks good, pain controlled, she says she's doing better. Good strength, awaiting path.

## 2024-03-29 NOTE — Consult Note (Signed)
 Physical Medicine and Rehabilitation Consult Reason for Consult: Impaired functional mobility Referring Physician: Pahwani   HPI: Lynn Hogan is a 78 y.o. female with a history of malignant melanoma, CAD and ischemic cardiomyopathy who presented to the emergency room with increased back pain over a week ago.  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  This resulted in moderate to severe canal stenosis at T9. Pt was seen by NS and decompression and fusion was recommended. On 03/25/24 Dr. Joshua performed decompression at T8-T11 with PLIF T7-T12. Pt to wear TLSO when OOB. Pt being followed medical issues post-op by [facility]. Pt has been up with therapies and has been minassist for sit-std transfers and has walked 100' at min assist level as well. Pt lives at home alone in one level house with 1 STE. She was independent prior to admit.     Home: Home Living Family/patient expects to be discharged to:: Private residence Living Arrangements: Alone Available Help at Discharge: Family, Available PRN/intermittently Type of Home: Other(Comment) (town home) Home Access: Stairs to enter Secretary/administrator of Steps: 1 Home Layout: One level Bathroom Shower/Tub: Engineer, manufacturing systems: Standard (has Training and development officer) Home Equipment: Information systems manager, Rollator (4 wheels), [employer] - quad Additional Comments: plans to sleep in recliner at home  Functional History: Prior Function Prior Level of Function : Independent/Modified Independent Mobility Comments: uses rollator in the home but reports more for convenience of transporting items than balance ADLs Comments: pt reports independent Functional Status:  Mobility: Bed Mobility Overal bed mobility: Needs Assistance Bed Mobility: Supine to Sit Supine to sit: Min assist, HOB  elevated General bed mobility comments: up in chair Transfers Overall transfer level: Needs assistance Equipment used: Rolling walker (2 wheels) Transfers: Sit to/from Stand Sit to Stand: Min assist General transfer comment: assist for rise and steady, steadying especially when transitioning hands from support surface<>RW. stand x3, from recliner x2 and from toilet x1. Ambulation/Gait Ambulation/Gait assistance: Min assist Gait Distance (Feet): 100 Feet Assistive device: Rolling walker (2 wheels) Gait Pattern/deviations: Decreased stride length, Trunk flexed, Step-through pattern General Gait Details: assist to steady, guide RW especially during turns, cues for upright posture and positioning in RW Gait velocity: decr    ADL: ADL Overall ADL's : Needs assistance/impaired Eating/Feeding: Set up, Sitting Grooming: Set up, Sitting Upper Body Bathing: Minimal assistance, Sitting Lower Body Bathing: Maximal assistance, Sit to/from stand Upper Body Dressing : Sitting, Maximal assistance Upper Body Dressing Details (indicate cue type and reason): for brace application Lower Body Dressing: Maximal assistance, Sit to/from stand, Total assistance, +2 for safety/equipment Toilet Transfer: Minimal assistance, +2 for safety/equipment, Ambulation, Rolling walker (2 wheels), Moderate assistance Functional mobility during ADLs: Moderate assistance, +2 for safety/equipment, Rolling walker (2 wheels)  Cognition: Cognition Orientation Level: Oriented X4 Cognition Arousal: Alert Behavior During Therapy: WFL for tasks assessed/performed   Review of Systems  Constitutional:  Positive for malaise/fatigue.  HENT: Negative.    Eyes: Negative.   Respiratory: Negative.    Cardiovascular: Negative.   Gastrointestinal:  Positive for constipation.  Genitourinary:  Negative for dysuria.  Musculoskeletal:  Positive for back pain.  Skin: Negative.   Neurological:  Positive for weakness. Negative for  sensory change and focal weakness.  Psychiatric/Behavioral:  Negative for memory loss.    Past Medical History:  Diagnosis Date   Coronary artery disease  Hyperlipidemia    NSTEMI (non-ST elevated myocardial infarction) (HCC)    08/02/2018-PCI/DES to RCA    STEMI (ST elevation myocardial infarction) (HCC)    08/08/2018-PCI/DES to mLAD, 85% osital stenosis of 1st diag.    Systolic heart failure [facility])    Past Surgical History:  Procedure Laterality Date   CARDIAC CATHETERIZATION     CATARACT EXTRACTION, BILATERAL  02/12/2016   CORONARY STENT INTERVENTION  08/02/2018   CORONARY STENT INTERVENTION N/A 08/02/2018   Procedure: CORONARY STENT INTERVENTION;  Surgeon: Mady Bruckner, MD;  Location: [facility];  Service: Cardiovascular;  Laterality: N/A;   CORONARY/GRAFT ACUTE MI REVASCULARIZATION N/A 08/08/2018   Procedure: Coronary/Graft Acute MI Revascularization;  Surgeon: Burnard Debby LABOR, MD;  Location: [facility] CV [facility];  Service: Cardiovascular;  Laterality: N/A;   HYSTERECTOMY ABDOMINAL WITH SALPINGECTOMY  02/12/1971   LEFT HEART CATH AND CORONARY ANGIOGRAPHY N/A 08/02/2018   Procedure: LEFT HEART CATH AND CORONARY ANGIOGRAPHY;  Surgeon: Mady Bruckner, MD;  Location: [facility] CV [facility];  Service: Cardiovascular;  Laterality: N/A;   LEFT HEART CATH AND CORONARY ANGIOGRAPHY N/A 08/08/2018   Procedure: LEFT HEART CATH AND CORONARY ANGIOGRAPHY;  Surgeon: Burnard Debby LABOR, MD;  Location: [facility] CV [facility];  Service: Cardiovascular;  Laterality: N/A;   Family History  Problem Relation Age of Onset   Microcephaly Mother    Brain cancer Mother    Lung cancer Mother    Heart attack Father 74   Social History:  reports that she has never smoked. She has never used smokeless tobacco. She reports that she does not drink alcohol and does not use drugs. Allergies:  Allergies  Allergen Reactions   Ibuprofen Palpitations   Nsaids Palpitations    Patient stated she has not tried all NSAIDS  to see if they give her palpitations, but she is not willing to try anything else- just in case   Medications Prior to Admission  Medication Sig Dispense Refill   acetaminophen  (TYLENOL ) 325 MG tablet Take 325 mg by mouth every 6 (six) hours as needed for mild pain (pain score 1-3).     aspirin  EC 81 MG EC tablet Take 1 tablet (81 mg total) by mouth daily. 90 tablet 3   atorvastatin  (LIPITOR ) 40 MG tablet Take 1 tablet (40 mg total) by mouth daily. (Patient taking differently: Take 40 mg by mouth every evening.) 90 tablet 3   Cholecalciferol (VITAMIN D3) 10 MCG (400 UNIT) tablet Take 400 Units by mouth every evening.     diclofenac  Sodium (VOLTAREN ) 1 % GEL Apply 4 g topically 4 (four) times daily. (Patient taking differently: Apply 4 g topically daily as needed (for pain).) 100 g 0   ezetimibe  (ZETIA ) 10 MG tablet TAKE 1 TABLET EVERY DAY (NEED MD APPOINTMENT FOR REFILLS) 15 tablet 0   metoprolol  succinate (TOPROL -XL) 50 MG 24 hr tablet TAKE 1 TABLET EVERY DAY WITH OR IMMEDIATELY FOLLOWING A MEAL. Please call [phone] to schedule an overdue appointment for future refills. Thank you. (Patient taking differently: Take 50 mg by mouth every evening.) 15 tablet 0   ondansetron  (ZOFRAN -ODT) 4 MG disintegrating tablet 4mg  ODT q4 hours prn nausea/vomit (Patient taking differently: Take 4 mg by mouth every 8 (eight) hours as needed for nausea or vomiting.) 20 tablet 0   nitroGLYCERIN  (NITROSTAT ) 0.4 MG SL tablet Place 1 tablet (0.4 mg total) under the tongue every 5 (five) minutes as needed for chest pain. 25 tablet 3     Blood pressure ROLLEN)  183/73, pulse (!) 54, temperature 98 F (36.7 C), temperature source Oral, resp. rate 18, height 5' 1 (1.549 m), weight 80.7 kg, SpO2 92%. Physical Exam Constitutional:      Appearance: She is obese.  HENT:     Right Ear: External ear normal.     Left Ear: External ear normal.     Nose: Nose normal.     Mouth/Throat:     Mouth: Mucous membranes are moist.   Eyes:     Extraocular Movements: Extraocular movements intact.     Pupils: Pupils are equal, round, and reactive to light.  Cardiovascular:     Rate and Rhythm: Bradycardia present.  Pulmonary:     Effort: Pulmonary effort is normal.  Abdominal:     Palpations: Abdomen is soft.  Musculoskeletal:        General: Tenderness (mid and low back) present.     Cervical back: Normal range of motion.     Right lower leg: Edema present.     Left lower leg: Edema present.  Skin:    General: Skin is warm.     Comments: Large post-op dressing over the middle of spine  Neurological:     Mental Status: She is alert.     Comments: Alert and oriented x 3. Normal insight and awareness. Intact Memory. Normal language and speech. Cranial nerve exam unremarkable. MMT: BUE 4+/5 with pain inhibition proximally. BLE 3/5 HF (pain), 4- KE and 4+ADF/PF. I did not find any focal sensory deficits. DTR's 1+. No abnl resting tone.    Psychiatric:        Mood and Affect: Mood normal.        Behavior: Behavior normal.     No results found for this or any previous visit (from the past 24 hours). No results found.  Assessment/Plan: Diagnosis: 78 yo female with metastatc melanoma to the spine s/p surgical decompression and fusion T7-T12.  Does the need for close, 24 hr/day medical supervision in concert with the patient's rehab needs make it unreasonable for this patient to be served in a less intensive setting? Yes Co-Morbidities requiring supervision/potential complications:  -pain mgt -oncology considerations/plan -nutrition Due to bladder management, bowel management, safety, skin/wound care, disease management, medication administration, pain management, and patient education, does the patient require 24 hr/day rehab nursing? Yes Does the patient require coordinated care of a physician, rehab nurse, therapy disciplines of PT, OT to address physical and functional deficits in the context of the above medical  diagnosis(es)? Yes Addressing deficits in the following areas: balance, endurance, locomotion, strength, transferring, bowel/bladder control, bathing, dressing, feeding, grooming, toileting, and psychosocial support Can the patient actively participate in an intensive therapy program of at least 3 hrs of therapy per day at least 5 days per week? Yes The potential for patient to make measurable gains while on inpatient rehab is excellent Anticipated functional outcomes upon discharge from inpatient rehab are modified independent and supervision  with PT, modified independent and supervision with OT, n/a with SLP. Estimated rehab length of stay to reach the above functional goals is: 8-11 days Anticipated discharge destination: Home Overall Rehab/Functional Prognosis: excellent  POST ACUTE RECOMMENDATIONS: This patient's condition is appropriate for continued rehabilitative care in the following setting: CIR Patient has agreed to participate in recommended program. Yes Note that insurance prior authorization may be required for reimbursement for recommended care.  Comment: Pt has good potential to reach nearly mod I PT/OT goals  after an inpatient rehab stay.  However  we need to understand the follow up plan from an oncology standpoint because if she requires chemotherapy and/or radiation, that will potentially impact her ability to care for herself at home.  In addition,  the logistics of going back and forth to treatments will present a challenge to her and family. Rehab Admissions Coordinator to follow up.      I have personally performed a face to face diagnostic evaluation of this patient. Additionally, I have examined the patient's medical record including any pertinent labs and radiographic images.    Thanks,  Arthea ONEIDA Gunther, MD 03/29/2024

## 2024-03-29 NOTE — Progress Notes (Signed)
 PROGRESS NOTE    Lynn Hogan  FMW:[id number] DOB: [birth year] DOA: 03/21/2024 PCP: Joshua Franky Sharper, PA-C   Brief Narrative:  Lynn Hogan is a 78 y.o. female with medical history significant of malignant melanoma of the left forearm status post chemotherapy and surgery years ago, coronary artery disease, hyperlipidemia, essential hypertension, ischemic cardiomyopathy, degenerative disc disease who presented to the ER at [facility] with back pain.  MRI showed metastatic disease at T9 and T10 as well as T8.  Seen by neurosurgery and oncology.  Assessment & Plan:   Principal Problem:   Spinal cord mass (HCC) Active Problems:   Hyperlipidemia   CAD (coronary artery disease)   Bone lesion   History of heart failure   Hypertension   Malignant melanoma of left forearm (HCC)   Status post thoracic spinal fusion  Back Pain due to Metastatic Cancer: Osseus Metastatic Disease at T9 with Extension inferiorly to involve T10  T8 Metastasis/moderate to severe canal stenosis. CT chest abdomen pelvis shows no primary source was found.  MRI brain did not show any metastatic disease. Seen by oncology, they recommended decompression surgery for which patient refused initially but eventually agreed and underwent surgical decompression by neurosurgery on 03/25/2024, surgical pathology report pending.  Doing well postoperatively.  Neurosurgery recommends supportive care, PT OT, Don brace when out of bed.  Drain removed 03/28/2024.  Path report still pending.  Seen by PT OT, CIR recommended.  Medically stable.  Pending placement to CIR now.   History of Melanoma Malignant melanoma of L forearm 03/13/2022    Hypertension Controlled.  Continue Toprol -XL.   Dyslipidemia Lipitor , zetia    CAD Noted, no CP  Prediabetes: Hemoglobin A1c in chart 5 years ago was 5.7 and again 5.7 this hospitalization.  No need of SSI.  DVT prophylaxis: SCD's Start: 03/25/24 2034   Code Status: Full Code  Family  Communication:  None present at bedside.  Plan of care discussed with patient in length and he/she verbalized understanding and agreed with it.  Status is: Inpatient Remains inpatient appropriate because: Medically stable, pending placement to CIR.  Estimated body mass index is 33.62 kg/m as calculated from the following:   Height as of this encounter: 5' 1 (1.549 m).   Weight as of this encounter: 80.7 kg.    Nutritional Assessment: Body mass index is 33.62 kg/m.Lynn Hogan Seen by dietician.  I agree with the assessment and plan as outlined below: Nutrition Status:        . Skin Assessment: I have examined the patient's skin and I agree with the wound assessment as performed by the wound care RN as outlined below:    Consultants:  Neurosurgery and oncology  Procedures:  As above  Antimicrobials:  Anti-infectives (From admission, onward)    Start     Dose/Rate Route Frequency Ordered Stop   03/25/24 2300  ceFAZolin  (ANCEF ) IVPB 2g/100 mL premix        2 g 200 mL/hr over 30 Minutes Intravenous Every 8 hours 03/25/24 2033 03/26/24 0741         Subjective: Patient seen and examined.  She has no complaints.  She is happy with the improvement.  She had no questions for me.  She was seen by neurosurgery oncology this morning.  Objective: Vitals:   03/28/24 2008 03/28/24 2353 03/29/24 0411 03/29/24 0750  BP: (!) 131/53 (!) 150/67 (!) 158/67 (!) 183/73  Pulse: 62 (!) 58 (!) 55 (!) 54  Resp: 18 18 18 18   Temp:  98.4 F (36.9 C) 98.1 F (36.7 C) 98 F (36.7 C) 98 F (36.7 C)  TempSrc: Oral Oral Oral Oral  SpO2: 93% 94% 93% 92%  Weight:      Height:        Intake/Output Summary (Last 24 hours) at 03/29/2024 1146 Last data filed at 03/29/2024 0416 Gross per 24 hour  Intake 180 ml  Output 900 ml  Net -720 ml    Filed Weights   03/21/24 1158 03/25/24 1242  Weight: 80.7 kg 80.7 kg    Examination:  General exam: Appears calm and comfortable  Respiratory system:  Clear to auscultation. Respiratory effort normal. Cardiovascular system: S1 & S2 heard, RRR. No JVD, murmurs, rubs, gallops or clicks.  +1 pitting edema bilateral lower extremity. Gastrointestinal system: Abdomen is nondistended, soft and nontender. No organomegaly or masses felt. Normal bowel sounds heard. Central nervous system: Alert and oriented. No focal neurological deficits. Extremities: Symmetric 5 x 5 power. Skin: No rashes, lesions or ulcers.  Psychiatry: Judgement and insight appear normal. Mood & affect appropriate.   Data Reviewed: I have personally reviewed following labs and imaging studies  CBC: Recent Labs  Lab 03/24/24 0457 03/26/24 0439  WBC 6.8 10.1  NEUTROABS 4.7 7.8*  HGB 13.2 12.0  HCT 40.8 36.9  MCV 96.7 95.8  PLT 352 383   Basic Metabolic Panel: Recent Labs  Lab 03/24/24 0457 03/26/24 0439  NA 136 136  K 4.0 4.0  CL 103 105  CO2 21* 23  GLUCOSE 138* 153*  BUN 14 11  CREATININE 0.77 0.84  CALCIUM  9.3 8.6*   GFR: Estimated Creatinine Clearance: 52.3 mL/min (by C-G formula based on SCr of 0.84 mg/dL). Liver Function Tests: No results for input(s): AST, ALT, ALKPHOS, BILITOT, PROT, ALBUMIN in the last 168 hours.  No results for input(s): LIPASE, AMYLASE in the last 168 hours. No results for input(s): AMMONIA in the last 168 hours. Coagulation Profile: No results for input(s): INR, PROTIME in the last 168 hours. Cardiac Enzymes: No results for input(s): CKTOTAL, CKMB, CKMBINDEX, TROPONINI in the last 168 hours. BNP (last 3 results) No results for input(s): PROBNP in the last 8760 hours. HbA1C: Recent Labs    03/27/24 0834  HGBA1C 5.7*   CBG: No results for input(s): GLUCAP in the last 168 hours. Lipid Profile: No results for input(s): CHOL, HDL, LDLCALC, TRIG, CHOLHDL, LDLDIRECT in the last 72 hours. Thyroid Function Tests: No results for input(s): TSH, T4TOTAL, FREET4, T3FREE,  THYROIDAB in the last 72 hours. Anemia Panel: No results for input(s): VITAMINB12, FOLATE, FERRITIN, TIBC, IRON, RETICCTPCT in the last 72 hours. Sepsis Labs: No results for input(s): PROCALCITON, LATICACIDVEN in the last 168 hours.  Recent Results (from the past 240 hours)  Surgical PCR screen     Status: None   Collection Time: 03/24/24  1:09 PM   Specimen: Nasal Mucosa; Nasal Swab  Result Value Ref Range Status   MRSA, PCR NEGATIVE NEGATIVE Final   Staphylococcus aureus NEGATIVE NEGATIVE Final    Comment: (NOTE) The Xpert SA Assay (FDA approved for NASAL specimens in patients 35 years of age and older), is one component of a comprehensive surveillance program. It is not intended to diagnose infection nor to guide or monitor treatment. Performed at [facility], [address] [place], [place] [address]      Radiology Studies: No results found.   Scheduled Meds:  atorvastatin   40 mg Oral QPM   dexamethasone  (DECADRON ) injection  4 mg  Intravenous Q8H   Or   dexamethasone   4 mg Oral Q8H   docusate sodium   100 mg Oral BID   ezetimibe   10 mg Oral Daily   metoprolol  succinate  50 mg Oral QPM   polyethylene glycol  17 g Oral Daily   senna  1 tablet Oral BID   senna-docusate  2 tablet Oral QHS   sodium chloride  flush  3 mL Intravenous Q12H   Continuous Infusions:     LOS: 7 days   Fredia Skeeter, MD [facility]  03/29/2024, 11:46 AM   *Please note that this is a verbal dictation therefore any spelling or grammatical errors are due to the Dragon Medical One system interpretation.  Please page via Amion and do not message via secure chat for urgent patient care matters. Secure chat can be used for non urgent patient care matters.  How to contact the [facility] Attending or Consulting provider 7A - 7P or covering provider during after hours 7P -7A, for this patient?  Check the care team in [facility] and look for a) attending/consulting [facility] provider  listed and b) the [facility] team listed. Page or secure chat 7A-7P. Log into [email or web address] and use [facility] universal password to access. If you do not have the password, please contact the hospital operator. Locate the [facility] provider you are looking for under [facility] and page to a number that you can be directly reached. If you still have difficulty reaching the provider, please page the [facility] (Director on Call) for the Hospitalists listed on amion for assistance.

## 2024-03-29 NOTE — TOC Progression Note (Signed)
 Transition of Care [facility]) - Progression Note    Patient Details  Name: Lynn Hogan MRN: [id number] Date of Birth: [birth year]  [facility] CM/SW Contact  Andrez JULIANNA George, RN Phone Number: 03/29/2024, 1:42 PM  Clinical Narrative:     Awaiting pathology and oncology plans to determine next steps. CIR following.  IP Care management following.  Expected Discharge Plan: IP Rehab Facility Barriers to Discharge: Continued Medical Work up               Expected Discharge Plan and Services   Discharge Planning Services: CM Consult   Living arrangements for the past 2 months: Single Family Home                                       Social Drivers of Health (SDOH) Interventions SDOH Screenings   Food Insecurity: No Food Insecurity (03/22/2024)  Housing: Low Risk  (03/22/2024)  Transportation Needs: No Transportation Needs (03/22/2024)  Utilities: Not At Risk (03/22/2024)  Financial Resource Strain: Low Risk  (08/01/2018)  Physical Activity: Sufficiently Active (08/01/2018)  Social Connections: Socially Isolated (03/22/2024)  Stress: No Stress Concern Present (08/01/2018)  Tobacco Use: Low Risk  (03/25/2024)    Readmission Risk Interventions     No data to display

## 2024-03-29 NOTE — Plan of Care (Signed)
  Problem: Health Behavior/Discharge Planning: Goal: Ability to manage health-related needs will improve Outcome: Progressing   Problem: Activity: Goal: Risk for activity intolerance will decrease Outcome: Progressing   Problem: Nutrition: Goal: Adequate nutrition will be maintained Outcome: Progressing   Problem: Coping: Goal: Level of anxiety will decrease Outcome: Progressing   Problem: Pain Managment: Goal: General experience of comfort will improve and/or be controlled Outcome: Progressing   Problem: Safety: Goal: Ability to remain free from injury will improve Outcome: Progressing   Problem: Skin Integrity: Goal: Risk for impaired skin integrity will decrease Outcome: Progressing

## 2024-03-30 DIAGNOSIS — G9589 Other specified diseases of spinal cord: Secondary | ICD-10-CM | POA: Diagnosis not present

## 2024-03-30 LAB — BASIC METABOLIC PANEL WITH GFR
Anion gap: 8 (ref 5–15)
BUN: 13 mg/dL (ref 8–23)
CO2: 27 mmol/L (ref 22–32)
Calcium: 8.7 mg/dL — ABNORMAL LOW (ref 8.9–10.3)
Chloride: 101 mmol/L (ref 98–111)
Creatinine, Ser: 0.62 mg/dL (ref 0.44–1.00)
GFR, Estimated: >60 mL/min (ref >60)
Glucose, Bld: 99 mg/dL (ref 70–99)
Potassium: 3.9 mmol/L (ref 3.5–5.1)
Sodium: 136 mmol/L (ref 135–145)

## 2024-03-30 LAB — CBC WITH DIFFERENTIAL/PLATELET
Abs Immature Granulocytes: 0.18 K/uL — ABNORMAL HIGH (ref 0.00–0.07)
Basophils Absolute: 0 K/uL (ref 0.0–0.1)
Basophils Relative: 0 %
Eosinophils Absolute: 0 K/uL (ref 0.0–0.5)
Eosinophils Relative: 0 %
HCT: 36.1 % (ref 36.0–46.0)
Hemoglobin: 11.8 g/dL — ABNORMAL LOW (ref 12.0–15.0)
Immature Granulocytes: 2 %
Lymphocytes Relative: 27 %
Lymphs Abs: 2.7 K/uL (ref 0.7–4.0)
MCH: 31 pg (ref 26.0–34.0)
MCHC: 32.7 g/dL (ref 30.0–36.0)
MCV: 94.8 fL (ref 80.0–100.0)
Monocytes Absolute: 1 K/uL (ref 0.1–1.0)
Monocytes Relative: 10 %
Neutro Abs: 6.2 K/uL (ref 1.7–7.7)
Neutrophils Relative %: 61 %
Platelets: 363 K/uL (ref 150–400)
RBC: 3.81 MIL/uL — ABNORMAL LOW (ref 3.87–5.11)
RDW: 13.9 % (ref 11.5–15.5)
WBC: 10.2 K/uL (ref 4.0–10.5)
nRBC: 0.2 % (ref 0.0–0.2)

## 2024-03-30 MED ORDER — LIDOCAINE HCL URETHRAL/MUCOSAL 2 % EX GEL
1.0000 | Freq: Once | CUTANEOUS | Status: AC
  Administered 2024-03-30: 1 via TOPICAL
  Filled 2024-03-30: qty 6

## 2024-03-30 MED ORDER — BISACODYL 10 MG RE SUPP
10.0000 mg | Freq: Once | RECTAL | Status: AC
  Administered 2024-03-30: 10 mg via RECTAL

## 2024-03-30 MED ORDER — OXYCODONE HCL 5 MG PO TABS
5.0000 mg | ORAL_TABLET | ORAL | Status: DC | PRN
  Administered 2024-03-31 – 2024-04-09 (×41): 5 mg via ORAL
  Filled 2024-03-30 (×42): qty 1

## 2024-03-30 NOTE — Progress Notes (Signed)
 Inpatient Rehab Admissions Coordinator:   I met with patient at bedside to discuss discharge support.  She reports that both of her daughters work and there isn't really a plan yet.  She was agreeable to me calling today, so I spoke to her daughter [name] on the phone.  [name] confirms that both she and her sister, Kirk, work full time and that her schedule is a little more flexible but not to allow 24/7 if needed.  [name] further confirms that she has 2 small dogs, and Cari has multiple dogs as well, so neither home would be ideal for recovering from spinal surgery.  We discussed that surgical pathology is pending, and we're unclear on where/how oncology fits into pt's timeline of recovery.  I will f/u with Dr. Babs tomorrow AM regarding caregiver support, and I've reached out to Dr. Joshua to see how/when oncology fits into this puzzle.  Will follow.   Reche Lowers, PT, DPT Admissions Coordinator [phone] 03/30/24  3:33 PM

## 2024-03-30 NOTE — Progress Notes (Signed)
 PROGRESS NOTE    Lynn Hogan  FMW:[id number] DOB: August 12, 1945 DOA: 03/21/2024 PCP: Joshua Franky Sharper, PA-C   Brief Narrative:  Lynn Hogan is a 78 y.o. female with medical history significant of malignant melanoma of the left forearm status post chemotherapy and surgery years ago, coronary artery disease, hyperlipidemia, essential hypertension, ischemic cardiomyopathy, degenerative disc disease who presented to the ER at [facility] with back pain.  MRI showed metastatic disease at T9 and T10 as well as T8.  Seen by neurosurgery and oncology.  Assessment & Plan:   Principal Problem:   Spinal cord mass (HCC) Active Problems:   Hyperlipidemia   CAD (coronary artery disease)   Bone lesion   History of heart failure   Hypertension   Malignant melanoma of left forearm (HCC)   Status post thoracic spinal fusion  Back Pain due to Metastatic Cancer: Osseus Metastatic Disease at T9 with Extension inferiorly to involve T10  T8 Metastasis/moderate to severe canal stenosis. CT chest abdomen pelvis shows no primary source was found.  MRI brain did not show any metastatic disease. Seen by oncology, they recommended decompression surgery for which patient refused initially but eventually agreed and underwent surgical decompression by neurosurgery on 03/25/2024, surgical pathology report pending.  Doing well postoperatively.  Neurosurgery recommends supportive care, PT OT, Don brace when out of bed.  Drain removed 03/28/2024.  Path report still pending.  Seen by PT OT, CIR recommended.  Medically stable.  Pending placement to CIR now.   History of Melanoma Malignant melanoma of L forearm 03/13/2022    Hypertension Controlled.  Continue Toprol -XL.   Dyslipidemia Lipitor , zetia    CAD Noted, no CP  Prediabetes: Hemoglobin A1c in chart 5 years ago was 5.7 and again 5.7 this hospitalization.  No need of SSI.  Constipation: Order Dulcolax suppository.  If this did not work, plan to try  Fleet enema.  DVT prophylaxis: SCD's Start: 03/25/24 2034   Code Status: Full Code  Family Communication:  None present at bedside.  Plan of care discussed with patient in length and he/she verbalized understanding and agreed with it.  Status is: Inpatient Remains inpatient appropriate because: Medically stable, pending placement to CIR.  Estimated body mass index is 33.62 kg/m as calculated from the following:   Height as of this encounter: 5' 1 (1.549 m).   Weight as of this encounter: 80.7 kg.    Nutritional Assessment: Body mass index is 33.62 kg/m.[name] Seen by dietician.  I agree with the assessment and plan as outlined below: Nutrition Status:        . Skin Assessment: I have examined the patient's skin and I agree with the wound assessment as performed by the wound care RN as outlined below:    Consultants:  Neurosurgery and oncology  Procedures:  As above  Antimicrobials:  Anti-infectives (From admission, onward)    Start     Dose/Rate Route Frequency Ordered Stop   03/25/24 2300  ceFAZolin  (ANCEF ) IVPB 2g/100 mL premix        2 g 200 mL/hr over 30 Minutes Intravenous Every 8 hours 03/25/24 2033 03/26/24 0741         Subjective: Patient seen and examined.  No new complaint other than just constipation.  Objective: Vitals:   03/29/24 2310 03/30/24 0337 03/30/24 0821 03/30/24 1159  BP: (!) 154/66 (!) 160/66 (!) 167/64 (!) 134/59  Pulse: (!) 46 (!) 48 96 (!) 58  Resp: 19 19 18 18   Temp: 98.6 F (37 C)  97.8 F (36.6 C) 98.3 F (36.8 C) 97.7 F (36.5 C)  TempSrc: Oral Oral Oral Oral  SpO2: 93% 96% 92% 97%  Weight:      Height:        Intake/Output Summary (Last 24 hours) at 03/30/2024 1343 Last data filed at 03/29/2024 2000 Gross per 24 hour  Intake --  Output 500 ml  Net -500 ml    Filed Weights   03/21/24 1158 03/25/24 1242  Weight: 80.7 kg 80.7 kg    Examination:  General exam: Appears calm and comfortable  Respiratory system: Clear  to auscultation. Respiratory effort normal. Cardiovascular system: S1 & S2 heard, RRR. No JVD, murmurs, rubs, gallops or clicks.  +1 pitting edema bilateral lower extremity. Gastrointestinal system: Abdomen is nondistended, soft and nontender. No organomegaly or masses felt. Normal bowel sounds heard. Central nervous system: Alert and oriented. No focal neurological deficits. Extremities: Symmetric 5 x 5 power. Skin: No rashes, lesions or ulcers.  Psychiatry: Judgement and insight appear normal. Mood & affect appropriate.   Data Reviewed: I have personally reviewed following labs and imaging studies  CBC: Recent Labs  Lab 03/24/24 0457 03/26/24 0439 03/30/24 0837  WBC 6.8 10.1 10.2  NEUTROABS 4.7 7.8* 6.2  HGB 13.2 12.0 11.8*  HCT 40.8 36.9 36.1  MCV 96.7 95.8 94.8  PLT 352 383 363   Basic Metabolic Panel: Recent Labs  Lab 03/24/24 0457 03/26/24 0439 03/30/24 0837  NA 136 136 136  K 4.0 4.0 3.9  CL 103 105 101  CO2 21* 23 27  GLUCOSE 138* 153* 99  BUN 14 11 13   CREATININE 0.77 0.84 0.62  CALCIUM  9.3 8.6* 8.7*   GFR: Estimated Creatinine Clearance: 54.9 mL/min (by C-G formula based on SCr of 0.62 mg/dL). Liver Function Tests: No results for input(s): AST, ALT, ALKPHOS, BILITOT, PROT, ALBUMIN in the last 168 hours.  No results for input(s): LIPASE, AMYLASE in the last 168 hours. No results for input(s): AMMONIA in the last 168 hours. Coagulation Profile: No results for input(s): INR, PROTIME in the last 168 hours. Cardiac Enzymes: No results for input(s): CKTOTAL, CKMB, CKMBINDEX, TROPONINI in the last 168 hours. BNP (last 3 results) No results for input(s): PROBNP in the last 8760 hours. HbA1C: No results for input(s): HGBA1C in the last 72 hours.  CBG: No results for input(s): GLUCAP in the last 168 hours. Lipid Profile: No results for input(s): CHOL, HDL, LDLCALC, TRIG, CHOLHDL, LDLDIRECT in the last 72  hours. Thyroid Function Tests: No results for input(s): TSH, T4TOTAL, FREET4, T3FREE, THYROIDAB in the last 72 hours. Anemia Panel: No results for input(s): VITAMINB12, FOLATE, FERRITIN, TIBC, IRON, RETICCTPCT in the last 72 hours. Sepsis Labs: No results for input(s): PROCALCITON, LATICACIDVEN in the last 168 hours.  Recent Results (from the past 240 hours)  Surgical PCR screen     Status: None   Collection Time: 03/24/24  1:09 PM   Specimen: Nasal Mucosa; Nasal Swab  Result Value Ref Range Status   MRSA, PCR NEGATIVE NEGATIVE Final   Staphylococcus aureus NEGATIVE NEGATIVE Final    Comment: (NOTE) The Xpert SA Assay (FDA approved for NASAL specimens in patients 18 years of age and older), is one component of a comprehensive surveillance program. It is not intended to diagnose infection nor to guide or monitor treatment. Performed at [facility], [address], [place], [place] [address]      Radiology Studies: No results found.   Scheduled Meds:  atorvastatin   40 mg Oral QPM   dexamethasone  (DECADRON ) injection  4 mg Intravenous Q8H   Or   dexamethasone   4 mg Oral Q8H   docusate sodium   100 mg Oral BID   ezetimibe   10 mg Oral Daily   metoprolol  succinate  50 mg Oral QPM   polyethylene glycol  17 g Oral Daily   senna  1 tablet Oral BID   senna-docusate  2 tablet Oral QHS   sodium chloride  flush  3 mL Intravenous Q12H   Continuous Infusions:     LOS: 8 days   Fredia Skeeter, MD [facility]  03/30/2024, 1:43 PM   *Please note that this is a verbal dictation therefore any spelling or grammatical errors are due to the Dragon Medical One system interpretation.  Please page via Amion and do not message via secure chat for urgent patient care matters. Secure chat can be used for non urgent patient care matters.  How to contact the [facility] Attending or Consulting provider 7A - 7P or covering provider during after hours 7P -7A, for  this patient?  Check the care team in [facility] and look for a) attending/consulting [facility] provider listed and b) the [facility] team listed. Page or secure chat 7A-7P. Log into [email or web address] and use [place]'s universal password to access. If you do not have the password, please contact the hospital operator. Locate the [facility] provider you are looking for under [facility] and page to a number that you can be directly reached. If you still have difficulty reaching the provider, please page the [facility] (Director on Call) for the Hospitalists listed on amion for assistance.

## 2024-03-30 NOTE — Progress Notes (Signed)
 PT Cancellation Note  Patient Details Name: Lynn Hogan MRN: [id number] DOB: [birth year]   Cancelled Treatment:    Reason Eval/Treat Not Completed: (P) Patient declined, no reason specified (Attempted to see pt x2 this morning. Pt initially requested to eat breakfast first and then reports pain from constipation and declines mobility at this time. Will follow up as able.)   [name] 03/30/2024, 1:45 PM

## 2024-03-30 NOTE — Plan of Care (Signed)
   Problem: Education: Goal: Knowledge of General Education information will improve Description: Including pain rating scale, medication(s)/side effects and non-pharmacologic comfort measures Outcome: Progressing   Problem: Clinical Measurements: Goal: Will remain free from infection Outcome: Progressing   Problem: Clinical Measurements: Goal: Respiratory complications will improve Outcome: Progressing

## 2024-03-30 NOTE — Progress Notes (Signed)
 Patient ID: Lynn Hogan, female   DOB: 12/31/1945, 78 y.o.   MRN: [id number] She looks good today.  Her big complaint is constipation.  She does have appropriate back soreness.  No leg pain or numbness tingling or weakness.  Dressing remains dry.  Awaiting pathology.  Continue PT and OT.

## 2024-03-31 DIAGNOSIS — G9589 Other specified diseases of spinal cord: Secondary | ICD-10-CM | POA: Diagnosis not present

## 2024-03-31 MED ORDER — DEXAMETHASONE 2 MG PO TABS
2.0000 mg | ORAL_TABLET | Freq: Four times a day (QID) | ORAL | Status: DC
  Administered 2024-03-31 (×2): 2 mg via ORAL
  Filled 2024-03-31 (×2): qty 1

## 2024-03-31 MED ORDER — DEXAMETHASONE SODIUM PHOSPHATE 4 MG/ML IJ SOLN
2.0000 mg | Freq: Four times a day (QID) | INTRAMUSCULAR | Status: DC
  Administered 2024-03-31 – 2024-04-01 (×2): 2 mg via INTRAVENOUS
  Filled 2024-03-31 (×2): qty 1

## 2024-03-31 MED ORDER — LIDOCAINE HCL URETHRAL/MUCOSAL 2 % EX GEL
1.0000 | Freq: Once | CUTANEOUS | Status: DC
  Filled 2024-03-31: qty 6

## 2024-03-31 MED ORDER — NALOXEGOL OXALATE 12.5 MG PO TABS
12.5000 mg | ORAL_TABLET | Freq: Every day | ORAL | Status: DC
  Administered 2024-03-31: 12.5 mg via ORAL
  Filled 2024-03-31 (×10): qty 1

## 2024-03-31 NOTE — Progress Notes (Signed)
 Occupational Therapy Treatment Patient Details Name: Lynn Hogan MRN: [id number] DOB: [birth year] Today's Date: 03/31/2024   History of present illness Pt is a 78 y.o. female presenting 9/22 with progressive back pain. 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 S/p decompressive thoracic laminectomy/medial facetectomy, foraminotomy T8-11 with posterolateral arthridesis T7-12, allograft, segmental fixation T7-12. PMH: melanoma if L forearm, CAD, HLD, essential HTN, ischemic cardiomyopathy, DDD, NSTEMI   OT comments  Pt received in supine, agreeable for OT visit. AMPAC improved to 17/24 today, indicating improving functional performance. Functionally, she was no more than CGA for sit<>stand transfers and functional ambulation via RW. Needs mod A for TLSO mgmt, supervision and extensive cueing for carryover of spine precautions during toileting, grooming, and LE dressing. Initiated AE trial with pt able to manage LB clothing via modified technique with fair carryover. Transiently nauseated on toilet, fatigued once passed BM. RN updated. OT to continue to follow.      If plan is discharge home, recommend the following:  A little help with walking and/or transfers;A lot of help with bathing/dressing/bathroom;Assist for transportation;Help with stairs or ramp for entrance   Equipment Recommendations  Other (comment) (defer)    Recommendations for Other Services      Precautions / Restrictions Precautions Precautions: Back;Fall Recall of Precautions/Restrictions: Impaired Required Braces or Orthoses: Spinal Brace Spinal Brace: Thoracolumbosacral orthotic;Applied in sitting position Restrictions Weight Bearing Restrictions Per Provider Order: No       Mobility Bed Mobility Overal bed mobility: Needs Assistance Bed Mobility: Supine to Sit, Sit to Supine     Supine to sit: HOB  elevated, Used rails, Contact guard Sit to supine: Contact guard assist, HOB elevated, Used rails   General bed mobility comments: Inc effort to bring L>RLE into bed for sit>supine, pt preferring to transition to long sitting then turning to face EOB rather than traditional log roll technique    Transfers Overall transfer level: Needs assistance Equipment used: Rolling walker (2 wheels) Transfers: Sit to/from Stand Sit to Stand: Contact guard assist           General transfer comment: Inc effort & multiple attempts to come to standing, improved form with wBOS, extensive cues for hand placement     Balance Overall balance assessment: Needs assistance Sitting-balance support: Feet supported, Single extremity supported Sitting balance-Leahy Scale: Good Sitting balance - Comments: EOB; intermittently reliant on BUE for support due to fluctuating pain   Standing balance support: Bilateral upper extremity supported, Reliant on assistive device for balance, During functional activity Standing balance-Leahy Scale: Poor Standing balance comment: reliant on RW; seldom able to remove one UE from RW/sink to open doors, perform grooming, etc.                           ADL either performed or assessed with clinical judgement   ADL Overall ADL's : Needs assistance/impaired     Grooming: Contact guard assist;Standing;Wash/dry hands Grooming Details (indicate cue type and reason): occasionally leaning on forearms due to fatigue despite OT cues to maintain precautions standing sinkside         Upper Body Dressing : Moderate assistance;Sitting Upper Body Dressing Details (indicate cue type and reason): don/doff TLSO Lower Body Dressing: Supervision/safety;Cueing for compensatory techniques;Cueing for back precautions;Sitting/lateral leans Lower Body Dressing Details (indicate cue type and reason): initiated AE teaching, pt able to manage socks & underwear via figure  four technique  at [employer] Toilet Transfer: Contact guard assist;Ambulation;Rolling walker (2 wheels);Cueing for safety Toilet Transfer Details (indicate cue type and reason): cued for safe approach and widening BOS for improved body mechanics Toileting- Clothing Manipulation and Hygiene: Supervision/safety;Cueing for compensatory techniques;Cueing for back precautions;Sitting/lateral lean;Sit to/from stand Toileting - Clothing Manipulation Details (indicate cue type and reason): pt often leaning on forearms due to worsening back pain/transient nausea despite OT cues to maintain upright posture/neutral spine            Extremity/Trunk Assessment              Vision       Perception     Praxis     Communication Communication Communication: No apparent difficulties   Cognition Arousal: Alert Behavior During Therapy: WFL for tasks assessed/performed Cognition: No apparent impairments             OT - Cognition Comments: recalled 3/3 spine precautions, requires cues to maintain throughout session                 Following commands: Intact        Cueing   Cueing Techniques: Verbal cues, Gestural cues, Tactile cues  Exercises      Shoulder Instructions       General Comments      Pertinent Vitals/ Pain       Pain Assessment Pain Assessment: 0-10 Pain Score:  (unrated) Faces Pain Scale: Hurts even more Pain Location: back around incision site Pain Descriptors / Indicators: Discomfort Pain Intervention(s): Limited activity within patient's tolerance, Monitored during session, Repositioned  Home Living                                          Prior Functioning/Environment              Frequency  Min 2X/week        Progress Toward Goals  OT Goals(current goals can now be found in the care plan section)  Progress towards OT goals: Progressing toward goals     Plan      Co-evaluation                 AM-PAC OT 6 Clicks Daily  Activity     Outcome Measure   Help from another person eating meals?: None Help from another person taking care of personal grooming?: A Little Help from another person toileting, which includes using toliet, bedpan, or urinal?: A Little Help from another person bathing (including washing, rinsing, drying)?: A Lot Help from another person to put on and taking off regular upper body clothing?: A Lot Help from another person to put on and taking off regular lower body clothing?: A Little 6 Click Score: 17    End of Session Equipment Utilized During Treatment: Rolling walker (2 wheels);Back brace  OT Visit Diagnosis: Unsteadiness on feet (R26.81);Muscle weakness (generalized) (M62.81);Pain   Activity Tolerance Patient tolerated treatment well   Patient Left in bed;with call bell/phone within reach;with bed alarm set;with SCD's reapplied   Nurse Communication Mobility status        Time: 8954-8890 OT Time Calculation (min): 24 min  Charges: OT General Charges $OT Visit: 1 Visit OT Treatments $Self Care/Home Management : 23-37 mins  Olney Monier D., MSOT, OTR/L Acute Rehabilitation Services [phone] Secure Chat Preferred  Rikki Milch 03/31/2024, 1:47 PM

## 2024-03-31 NOTE — Progress Notes (Addendum)
 Subjective: Patient reports doing ok, a lot of back pain last night because she's trying to come off the morphine   Objective: Vital signs in last 24 hours: Temp:  [97.7 F (36.5 C)-98.9 F (37.2 C)] 98.7 F (37.1 C) (10/02 0742) Pulse Rate:  [50-96] 55 (10/02 0742) Resp:  [17-20] 18 (10/02 0742) BP: (109-169)/(50-68) 109/58 (10/02 0742) SpO2:  [91 %-97 %] 96 % (10/02 0742)  Intake/Output from previous day: 10/01 0701 - 10/02 0700 In: -  Out: 700 [Urine:700] Intake/Output this shift: No intake/output data recorded.  Neurologic: Grossly normal  Lab Results: Lab Results  Component Value Date   WBC 10.2 03/30/2024   HGB 11.8 (L) 03/30/2024   HCT 36.1 03/30/2024   MCV 94.8 03/30/2024   PLT 363 03/30/2024   Lab Results  Component Value Date   INR 1.00 08/08/2018   BMET Lab Results  Component Value Date   NA 136 03/30/2024   K 3.9 03/30/2024   CL 101 03/30/2024   CO2 27 03/30/2024   GLUCOSE 99 03/30/2024   BUN 13 03/30/2024   CREATININE 0.62 03/30/2024   CALCIUM  8.7 (L) 03/30/2024    Studies/Results: No results found.  Assessment/Plan: S/p thoracic tumor decompression, still waiting on path to come back. Pending CIR admission. Ok to be discharged when insurance approves and bed available. Having some trouble with bowel movment so we will work on this today.   LOS: 9 days    Lynn Hogan 03/31/2024, 8:01 AM

## 2024-03-31 NOTE — Plan of Care (Signed)
   Problem: Education: Goal: Knowledge of General Education information will improve Description Including pain rating scale, medication(s)/side effects and non-pharmacologic comfort measures Outcome: Progressing

## 2024-03-31 NOTE — Progress Notes (Signed)
 PROGRESS NOTE    Lynn Hogan  FMW:[id number] DOB: July 19, 1945 DOA: 03/21/2024 PCP: Joshua Franky Sharper, PA-C   Brief Narrative:  Lynn Hogan is a 78 y.o. female with medical history significant of malignant melanoma of the left forearm status post chemotherapy and surgery years ago, coronary artery disease, hyperlipidemia, essential hypertension, ischemic cardiomyopathy, degenerative disc disease who presented to the ER at [facility] with back pain.  MRI showed metastatic disease at T9 and T10 as well as T8.  Seen by neurosurgery and oncology.  Assessment & Plan:   Principal Problem:   Spinal cord mass (HCC) Active Problems:   Hyperlipidemia   CAD (coronary artery disease)   Bone lesion   History of heart failure   Hypertension   Malignant melanoma of left forearm (HCC)   Status post thoracic spinal fusion  Back Pain due to Metastatic Cancer: Osseus Metastatic Disease at T9 with Extension inferiorly to involve T10  T8 Metastasis/moderate to severe canal stenosis. CT chest abdomen pelvis shows no primary source was found.  MRI brain did not show any metastatic disease. Seen by oncology, they recommended decompression surgery for which patient refused initially but eventually agreed and underwent surgical decompression by neurosurgery on 03/25/2024, surgical pathology report still pending.  Doing well postoperatively.  Neurosurgery recommends supportive care, PT OT, Don brace when out of bed.  Drain removed 03/28/2024. Seen by PT OT, CIR recommended.  Patient remains on dexamethasone  per neurosurgery.  Medically stable.  Pending placement to CIR.   History of Melanoma Malignant melanoma of L forearm 03/13/2022    Hypertension Controlled.  Continue Toprol -XL.   Dyslipidemia Lipitor , zetia    CAD Noted, no CP  Prediabetes: Hemoglobin A1c in chart 5 years ago was 5.7 and again 5.7 this hospitalization.  No need of SSI.  Constipation: She did have a small bowel movement  yesterday.  She is on scheduled Colace, senna and MiraLAX  daily.  She is also on as needed Dulcolax suppository.  DVT prophylaxis: SCD's Start: 03/25/24 2034   Code Status: Full Code  Family Communication:  None present at bedside.  Plan of care discussed with patient in length and he/she verbalized understanding and agreed with it.  Status is: Inpatient Remains inpatient appropriate because: Medically stable, pending placement to CIR.  Estimated body mass index is 33.62 kg/m as calculated from the following:   Height as of this encounter: 5' 1 (1.549 m).   Weight as of this encounter: 80.7 kg.    Nutritional Assessment: Body mass index is 33.62 kg/m.Lynn Hogan Seen by dietician.  I agree with the assessment and plan as outlined below: Nutrition Status:        . Skin Assessment: I have examined the patient's skin and I agree with the wound assessment as performed by the wound care RN as outlined below:    Consultants:  Neurosurgery and oncology  Procedures:  As above  Antimicrobials:  Anti-infectives (From admission, onward)    Start     Dose/Rate Route Frequency Ordered Stop   03/25/24 2300  ceFAZolin  (ANCEF ) IVPB 2g/100 mL premix        2 g 200 mL/hr over 30 Minutes Intravenous Every 8 hours 03/25/24 2033 03/26/24 0741         Subjective: Seen and examined.  No complaints.  She had a bowel movement yesterday.  Objective: Vitals:   03/30/24 1957 03/30/24 2334 03/31/24 0409 03/31/24 0742  BP: (!) 134/50 (!) 169/67 (!) 117/52 (!) 109/58  Pulse: 60 (!) 53 ROLLEN)  50 (!) 55  Resp: 18 17 20 18   Temp: 98.9 F (37.2 C) 98 F (36.7 C) 97.8 F (36.6 C) 98.7 F (37.1 C)  TempSrc: Oral Oral Oral Oral  SpO2: 91% 92% 96% 96%  Weight:      Height:        Intake/Output Summary (Last 24 hours) at 03/31/2024 0838 Last data filed at 03/30/2024 2200 Gross per 24 hour  Intake --  Output 700 ml  Net -700 ml    Filed Weights   03/21/24 1158 03/25/24 1242  Weight: 80.7 kg  80.7 kg    Examination:  General exam: Appears calm and comfortable  Respiratory system: Clear to auscultation. Respiratory effort normal. Cardiovascular system: S1 & S2 heard, RRR. No JVD, murmurs, rubs, gallops or clicks.  +1 pitting edema bilateral lower extremity. Gastrointestinal system: Abdomen is nondistended, soft and nontender. No organomegaly or masses felt. Normal bowel sounds heard. Central nervous system: Alert and oriented. No focal neurological deficits. Extremities: Symmetric 5 x 5 power. Skin: No rashes, lesions or ulcers.  Psychiatry: Judgement and insight appear normal. Mood & affect appropriate.   Data Reviewed: I have personally reviewed following labs and imaging studies  CBC: Recent Labs  Lab 03/26/24 0439 03/30/24 0837  WBC 10.1 10.2  NEUTROABS 7.8* 6.2  HGB 12.0 11.8*  HCT 36.9 36.1  MCV 95.8 94.8  PLT 383 363   Basic Metabolic Panel: Recent Labs  Lab 03/26/24 0439 03/30/24 0837  NA 136 136  K 4.0 3.9  CL 105 101  CO2 23 27  GLUCOSE 153* 99  BUN 11 13  CREATININE 0.84 0.62  CALCIUM  8.6* 8.7*   GFR: Estimated Creatinine Clearance: 54.9 mL/min (by C-G formula based on SCr of 0.62 mg/dL). Liver Function Tests: No results for input(s): AST, ALT, ALKPHOS, BILITOT, PROT, ALBUMIN in the last 168 hours.  No results for input(s): LIPASE, AMYLASE in the last 168 hours. No results for input(s): AMMONIA in the last 168 hours. Coagulation Profile: No results for input(s): INR, PROTIME in the last 168 hours. Cardiac Enzymes: No results for input(s): CKTOTAL, CKMB, CKMBINDEX, TROPONINI in the last 168 hours. BNP (last 3 results) No results for input(s): PROBNP in the last 8760 hours. HbA1C: No results for input(s): HGBA1C in the last 72 hours.  CBG: No results for input(s): GLUCAP in the last 168 hours. Lipid Profile: No results for input(s): CHOL, HDL, LDLCALC, TRIG, CHOLHDL, LDLDIRECT in the last  72 hours. Thyroid Function Tests: No results for input(s): TSH, T4TOTAL, FREET4, T3FREE, THYROIDAB in the last 72 hours. Anemia Panel: No results for input(s): VITAMINB12, FOLATE, FERRITIN, TIBC, IRON, RETICCTPCT in the last 72 hours. Sepsis Labs: No results for input(s): PROCALCITON, LATICACIDVEN in the last 168 hours.  Recent Results (from the past 240 hours)  Surgical PCR screen     Status: None   Collection Time: 03/24/24  1:09 PM   Specimen: Nasal Mucosa; Nasal Swab  Result Value Ref Range Status   MRSA, PCR NEGATIVE NEGATIVE Final   Staphylococcus aureus NEGATIVE NEGATIVE Final    Comment: (NOTE) The Xpert SA Assay (FDA approved for NASAL specimens in patients 28 years of age and older), is one component of a comprehensive surveillance program. It is not intended to diagnose infection nor to guide or monitor treatment. Performed at [facility], [address] [place], [place] [address]      Radiology Studies: No results found.   Scheduled Meds:  atorvastatin   40 mg Oral  QPM   dexamethasone  (DECADRON ) injection  2 mg Intravenous Q6H   Or   dexamethasone   2 mg Oral Q6H   docusate sodium   100 mg Oral BID   ezetimibe   10 mg Oral Daily   metoprolol  succinate  50 mg Oral QPM   naloxegol oxalate  12.5 mg Oral Daily   polyethylene glycol  17 g Oral Daily   senna  1 tablet Oral BID   senna-docusate  2 tablet Oral QHS   sodium chloride  flush  3 mL Intravenous Q12H   Continuous Infusions:     LOS: 9 days   Fredia Skeeter, MD [facility]  03/31/2024, 8:38 AM   *Please note that this is a verbal dictation therefore any spelling or grammatical errors are due to the Dragon Medical One system interpretation.  Please page via Amion and do not message via secure chat for urgent patient care matters. Secure chat can be used for non urgent patient care matters.  How to contact the [facility] Attending or Consulting provider 7A - 7P or  covering provider during after hours 7P -7A, for this patient?  Check the care team in [facility] and look for a) attending/consulting [facility] provider listed and b) the [facility] team listed. Page or secure chat 7A-7P. Log into [email or web address] and use [facility]'s universal password to access. If you do not have the password, please contact the hospital operator. Locate the [facility] provider you are looking for under [facility] and page to a number that you can be directly reached. If you still have difficulty reaching the provider, please page the [facility] (Director on Call) for the Hospitalists listed on amion for assistance.

## 2024-03-31 NOTE — Progress Notes (Addendum)
 Inpatient Rehab Admissions Coordinator:   Note no urgent plans for oncology intervention now that spine has been decompressed.  Ongoing bowel issues.  I will start insurance auth request today.    1515: updated dtr [name] via phone.    Reche Lowers, PT, DPT Admissions Coordinator [phone] 03/31/24  8:46 AM

## 2024-03-31 NOTE — Progress Notes (Signed)
 Physical Therapy Treatment Patient Details Name: Lynn Hogan MRN: [id number] DOB: 08/18/1945 Today's Date: 03/31/2024   History of Present Illness Pt is a 78 y.o. female presenting 9/22 with progressive back pain. 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 S/p decompressive thoracic laminectomy/medial facetectomy, foraminotomy T8-11 with posterolateral arthridesis T7-12, allograft, segmental fixation T7-12. 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 Pt requests to use the bathroom at the beginning of the session and is able to perform pericare without assist. Pt able to tolerate increased gait distance with CGA for safety and standing rest breaks due to pain. Pt requires max A to don brace and cues to maintain back precautions functionally. Pt continues to benefit from PT services to progress toward functional mobility goals.     If plan is discharge home, recommend the following: A lot of help with bathing/dressing/bathroom;Help with stairs or ramp for entrance;Assist for transportation;A little help with walking and/or transfers   Can travel by private vehicle        Equipment Recommendations  None recommended by PT    Recommendations for Other Services       Precautions / Restrictions Precautions Precautions: Back;Fall Recall of Precautions/Restrictions: Impaired Required Braces or Orthoses: Spinal Brace Spinal Brace: Thoracolumbosacral orthotic;Applied in sitting position Restrictions Weight Bearing Restrictions Per Provider Order: No     Mobility  Bed Mobility Overal bed mobility: Needs Assistance Bed Mobility: Supine to Sit, Sit to Supine     Supine to sit: HOB elevated, Used rails, Contact guard Sit to supine: Contact guard assist, HOB elevated, Used  rails   General bed mobility comments: Cues for maintaining back precautions    Transfers Overall transfer level: Needs assistance Equipment used: Rolling walker (2 wheels) Transfers: Sit to/from Stand Sit to Stand: Supervision           General transfer comment: From EOB and low toilet seat with supervision for safety    Ambulation/Gait Ambulation/Gait assistance: Contact guard assist Gait Distance (Feet): 150 Feet Assistive device: Rolling walker (2 wheels) Gait Pattern/deviations: Step-through pattern, Decreased stride length Gait velocity: decreased     General Gait Details: Slow, guarded gait. Cues for upright posture and shoulder depression. CGA for safety, but no overt LOB   Stairs             Wheelchair Mobility     Tilt Bed    Modified Rankin (Stroke Patients Only)       Balance Overall balance assessment: Needs assistance Sitting-balance support: Feet supported Sitting balance-Leahy Scale: Good     Standing balance support: Bilateral upper extremity supported, Reliant on assistive device for balance, During functional activity Standing balance-Leahy Scale: Poor Standing balance comment: reliant on RW support                            Communication Communication Communication: No apparent difficulties  Cognition Arousal: Alert Behavior During Therapy: WFL for tasks assessed/performed   PT - Cognitive impairments: No apparent impairments                         Following commands: Intact      Cueing Cueing Techniques: Verbal cues, Gestural cues, Tactile cues  Exercises      General Comments  Pertinent Vitals/Pain Pain Assessment Pain Assessment: 0-10 Pain Score: 7  Pain Location: back - incision site Pain Descriptors / Indicators: Discomfort Pain Intervention(s): Limited activity within patient's tolerance, Monitored during session, Repositioned     PT Goals (current goals can now be found in  the care plan section) Acute Rehab PT Goals Patient Stated Goal: get better, go to rehab, and go home PT Goal Formulation: With patient Time For Goal Achievement: 04/09/24 Progress towards PT goals: Progressing toward goals    Frequency    Min 5X/week       AM-PAC PT 6 Clicks Mobility   Outcome Measure  Help needed turning from your back to your side while in a flat bed without using bedrails?: A Little Help needed moving from lying on your back to sitting on the side of a flat bed without using bedrails?: A Little Help needed moving to and from a bed to a chair (including a wheelchair)?: A Little Help needed standing up from a chair using your arms (e.g., wheelchair or bedside chair)?: A Little Help needed to walk in hospital room?: A Little Help needed climbing 3-5 steps with a railing? : A Lot 6 Click Score: 17    End of Session Equipment Utilized During Treatment: Gait belt;Back brace Activity Tolerance: Patient tolerated treatment well Patient left: in bed;with call bell/phone within reach;with bed alarm set Nurse Communication: Mobility status PT Visit Diagnosis: Unsteadiness on feet (R26.81);Muscle weakness (generalized) (M62.81);Difficulty in walking, not elsewhere classified (R26.2)     Time: 9086-9059 PT Time Calculation (min) (ACUTE ONLY): 27 min  Charges:    $Gait Training: 8-22 mins $Therapeutic Activity: 8-22 mins PT General Charges $$ ACUTE PT VISIT: 1 Visit                     [name], PTA Acute Rehabilitation Services Secure Chat Preferred  [phone]    [name] 03/31/2024, 10:08 AM

## 2024-03-31 NOTE — Progress Notes (Signed)
 IP PROGRESS NOTE  Subjective:   Lynn Hogan underwent a decompressive thoracic laminectomy on 03/25/2024.  She is recovering from surgery.  She continues to have pain at the surgical site.  No leg weakness or numbness.  She reports constipation.  She relates constipation to taking narcotics.  She is ambulating.  She is being evaluated for an inpatient rehabilitation stay.  Objective: Vital signs in last 24 hours: Blood pressure (!) 117/52, pulse (!) 50, temperature 97.8 F (36.6 C), temperature source Oral, resp. rate 20, height 5' 1 (1.549 m), weight 177 lb 14.6 oz (80.7 kg), SpO2 96%.  Intake/Output from previous day: 10/01 0701 - 10/02 0700 In: -  Out: 700 [Urine:700]  Physical Exam:  Abdomen: Soft and nontender Neurologic: The motor exam appears intact in the legs and feet bilaterally Skin: Thoracic spine dressing in place, left forearm scar without evidence of recurrent tumor    Lab Results: Recent Labs    03/30/24 0837  WBC 10.2  HGB 11.8*  HCT 36.1  PLT 363    BMET Recent Labs    03/30/24 0837  NA 136  K 3.9  CL 101  CO2 27  GLUCOSE 99  BUN 13  CREATININE 0.62  CALCIUM  8.7*    No results found for: CEA1, CEA, CAN199, CA125  Studies/Results: No results found.  Medications: I have reviewed the patient's current medications.  Assessment/Plan:  Melanoma 03/13/2022 left forearm malignant melanoma, nodular type, ulcerated, 2.2 x 1.4 x 0.8 cm, Breslow thickness 14 mm, Clark level V, stage IIc (pT4b pN0 ( 03/28/2022 needle biopsy left axillary lymph node-lymphoid tissue, no evidence of malignancy 04/24/2022 left forearm reexcision-no evidence of residual melanoma, 2 left axillary sentinel nodes negative for malignancy 11/23/2022 PET: Tracer avid lytic bone lesions at T9 and T10 03/21/2024 CT thoracic spine: Progression of lytic osseous metastatic disease at T9 with significant posterior extension into the spinal canal, mild progressive tumor at the  superior endplate of T10 03/21/2024 MRI thoracic and lumbar spine: Metastatic disease at T9 with extension to T10, extraosseous extension into the spinal canal and paraspinal soft tissues, moderate to severe canal stenosis at T9, small T8 metastasis 03/22/2024 CTs: Left apical pleural-based nodules favored scarring, no evidence of a primary malignancy or soft tissue metastasis, T9 and T10 metastases 03/24/2024 MRI brain: No evidence of metastatic disease, 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  Back pain secondary to a destructive mass at T9 3.  Hyperlipidemia 4.  History of coronary artery disease, NSTEMI 2020, STEMI 2020 5.  Hypertension 6.  History of CHF 7.  Basal cell carcinomas at the left ankle and right posterior arm excised 03/13/2022   Lynn Hogan is recovering from the thoracic decompressive laminectomy.  The pathology is pending.  I will contact pathology today.  The plan will be for an outpatient PET scan and follow-up at the Cancer center if a diagnosis of metastatic melanoma is confirmed.  There is no urgency to initiate systemic therapy for metastatic melanoma now that the spine has been compressed.  Recommendations: Postoperative care per Dr. Joshua Mayer Decadron  to off if Dr. Joshua agrees Follow-up pathology from the decompressive laminectomy-we will call pathology today PT/OT-inpatient rehabilitation stay as indicated Outpatient follow-up will be scheduled Cancer center Outpatient staging PET scan Please call Oncology as needed, I will be out until 04/04/2024  LOS: 9 days   Arley Hof, MD   03/31/2024, 6:40 AM

## 2024-04-01 DIAGNOSIS — G9589 Other specified diseases of spinal cord: Secondary | ICD-10-CM | POA: Diagnosis not present

## 2024-04-01 MED ORDER — ACETAMINOPHEN 500 MG PO TABS
1000.0000 mg | ORAL_TABLET | Freq: Three times a day (TID) | ORAL | Status: DC | PRN
  Filled 2024-04-01 (×2): qty 2

## 2024-04-01 MED ORDER — DEXAMETHASONE 2 MG PO TABS: 2.0000 mg | ORAL_TABLET | Freq: Three times a day (TID) | ORAL | 0 refills | Status: DC

## 2024-04-01 MED ORDER — METOPROLOL SUCCINATE ER 25 MG PO TB24
25.0000 mg | ORAL_TABLET | Freq: Every evening | ORAL | Status: DC
Start: 2024-04-01 — End: 2024-04-04
  Administered 2024-04-01 – 2024-04-03 (×3): 25 mg via ORAL
  Filled 2024-04-01 (×3): qty 1

## 2024-04-01 MED ORDER — DEXAMETHASONE SODIUM PHOSPHATE 4 MG/ML IJ SOLN
2.0000 mg | Freq: Three times a day (TID) | INTRAMUSCULAR | Status: DC
  Filled 2024-04-01: qty 1

## 2024-04-01 MED ORDER — METOPROLOL SUCCINATE ER 25 MG PO TB24: 25.0000 mg | ORAL_TABLET | Freq: Every evening | ORAL | 0 refills | Status: DC

## 2024-04-01 MED ORDER — DEXAMETHASONE 2 MG PO TABS
2.0000 mg | ORAL_TABLET | Freq: Three times a day (TID) | ORAL | Status: DC
  Administered 2024-04-01 – 2024-04-02 (×2): 2 mg via ORAL
  Filled 2024-04-01 (×4): qty 1

## 2024-04-01 NOTE — Progress Notes (Signed)
 Inpatient Rehab Admissions Coordinator:   [facility] requesting P2P.  Dr. Babs to complete at 2:40.  Will follow.   Reche Lowers, PT, DPT Admissions Coordinator [phone] 04/01/24  10:42 AM

## 2024-04-01 NOTE — TOC Progression Note (Signed)
 Transition of Care [facility]) - Progression Note    Patient Details  Name: Lynn Hogan MRN: [id number] Date of Birth: September 11, 1945  [facility] CM/SW Contact  Andrez JULIANNA George, RN Phone Number: 04/01/2024, 9:44 AM  Clinical Narrative:     Plan is for CIR pending insurance approval. IP Care management following.  Expected Discharge Plan: IP Rehab Facility Barriers to Discharge: Continued Medical Work up               Expected Discharge Plan and Services   Discharge Planning Services: CM Consult   Living arrangements for the past 2 months: Single Family Home                                       Social Drivers of Health (SDOH) Interventions SDOH Screenings   Food Insecurity: No Food Insecurity (03/22/2024)  Housing: Low Risk  (03/22/2024)  Transportation Needs: No Transportation Needs (03/22/2024)  Utilities: Not At Risk (03/22/2024)  Financial Resource Strain: Low Risk  (08/01/2018)  Physical Activity: Sufficiently Active (08/01/2018)  Social Connections: Socially Isolated (03/22/2024)  Stress: No Stress Concern Present (08/01/2018)  Tobacco Use: Low Risk  (03/25/2024)    Readmission Risk Interventions     No data to display

## 2024-04-01 NOTE — Progress Notes (Signed)
 PROGRESS NOTE    Lynn Hogan  FMW:[id number] DOB: Aug 15, 1945 DOA: 03/21/2024 PCP: Joshua Franky Sharper, PA-C   Brief Narrative:  Lynn Hogan is a 78 y.o. female with medical history significant of malignant melanoma of the left forearm status post chemotherapy and surgery years ago, coronary artery disease, hyperlipidemia, essential hypertension, ischemic cardiomyopathy, degenerative disc disease who presented to the ER at [facility] with back pain.  MRI showed metastatic disease at T9 and T10 as well as T8.  Seen by neurosurgery and oncology.  Assessment & Plan:   Principal Problem:   Spinal cord mass (HCC) Active Problems:   Hyperlipidemia   CAD (coronary artery disease)   Bone lesion   History of heart failure   Hypertension   Malignant melanoma of left forearm (HCC)   Status post thoracic spinal fusion  Back Pain due to Metastatic Cancer: Osseus Metastatic Disease at T9 with Extension inferiorly to involve T10  T8 Metastasis/moderate to severe canal stenosis. CT chest abdomen pelvis shows no primary source was found.  MRI brain did not show any metastatic disease. Seen by oncology, they recommended decompression surgery for which patient refused initially but eventually agreed and underwent surgical decompression by neurosurgery on 03/25/2024, surgical pathology report still pending.  Doing well postoperatively.  Neurosurgery recommends supportive care, PT OT, Don brace when out of bed.  Drain removed 03/28/2024. Seen by PT OT, CIR recommended.  Patient remains on dexamethasone  per neurosurgery.  Medically stable.  Pending placement to CIR.   History of Melanoma Malignant melanoma of L forearm 03/13/2022    Hypertension/sinus bradycardia Blood pressure mostly however she has sinus bradycardia which is asymptomatic, currently on Toprol -XL 50 mg, reduced to 25 mg p.o. daily today.     Dyslipidemia Lipitor , zetia    CAD Noted, no CP  Prediabetes: Hemoglobin A1c in  chart 5 years ago was 5.7 and again 5.7 this hospitalization.  No need of SSI.  Constipation: Resolved.  She is on scheduled Colace, senna and MiraLAX  daily.  She is also on as needed Dulcolax suppository.  DVT prophylaxis: SCD's Start: 03/25/24 2034   Code Status: Full Code  Family Communication:  None present at bedside.  Plan of care discussed with patient in length and he/she verbalized understanding and agreed with it.  Status is: Inpatient Remains inpatient appropriate because: Medically stable, pending placement to CIR.  Estimated body mass index is 33.62 kg/m as calculated from the following:   Height as of this encounter: 5' 1 (1.549 m).   Weight as of this encounter: 80.7 kg.    Nutritional Assessment: Body mass index is 33.62 kg/m.[name] Seen by dietician.  I agree with the assessment and plan as outlined below: Nutrition Status:        . Skin Assessment: I have examined the patient's skin and I agree with the wound assessment as performed by the wound care RN as outlined below:    Consultants:  Neurosurgery and oncology  Procedures:  As above  Antimicrobials:  Anti-infectives (From admission, onward)    Start     Dose/Rate Route Frequency Ordered Stop   03/25/24 2300  ceFAZolin  (ANCEF ) IVPB 2g/100 mL premix        2 g 200 mL/hr over 30 Minutes Intravenous Every 8 hours 03/25/24 2033 03/26/24 0741         Subjective: Patient seen and examined.  She has no complaints.  Objective: Vitals:   03/31/24 2101 04/01/24 0024 04/01/24 0414 04/01/24 0736  BP: 122/62 (!) 139/53 ROLLEN)  147/55 (!) 182/72  Pulse: (!) 55 (!) 42 (!) 45 (!) 50  Resp: 15 16 16 18   Temp: 98.4 F (36.9 C) 98.6 F (37 C) (!) 97.5 F (36.4 C) 97.9 F (36.6 C)  TempSrc: Oral Oral Oral Oral  SpO2: 94% 93% 92% 94%  Weight:      Height:        Intake/Output Summary (Last 24 hours) at 04/01/2024 0814 Last data filed at 03/31/2024 2340 Gross per 24 hour  Intake 3 ml  Output 350 ml  Net  -347 ml    Filed Weights   03/21/24 1158 03/25/24 1242  Weight: 80.7 kg 80.7 kg    Examination:  General exam: Appears calm and comfortable  Respiratory system: Clear to auscultation. Respiratory effort normal. Cardiovascular system: S1 & S2 heard, RRR. No JVD, murmurs, rubs, gallops or clicks.  +1 pitting edema bilateral lower extremity. Gastrointestinal system: Abdomen is nondistended, soft and nontender. No organomegaly or masses felt. Normal bowel sounds heard. Central nervous system: Alert and oriented. No focal neurological deficits. Extremities: Symmetric 5 x 5 power. Skin: No rashes, lesions or ulcers.  Psychiatry: Judgement and insight appear normal. Mood & affect appropriate.   Data Reviewed: I have personally reviewed following labs and imaging studies  CBC: Recent Labs  Lab 03/26/24 0439 03/30/24 0837  WBC 10.1 10.2  NEUTROABS 7.8* 6.2  HGB 12.0 11.8*  HCT 36.9 36.1  MCV 95.8 94.8  PLT 383 363   Basic Metabolic Panel: Recent Labs  Lab 03/26/24 0439 03/30/24 0837  NA 136 136  K 4.0 3.9  CL 105 101  CO2 23 27  GLUCOSE 153* 99  BUN 11 13  CREATININE 0.84 0.62  CALCIUM  8.6* 8.7*   GFR: Estimated Creatinine Clearance: 54.9 mL/min (by C-G formula based on SCr of 0.62 mg/dL). Liver Function Tests: No results for input(s): AST, ALT, ALKPHOS, BILITOT, PROT, ALBUMIN in the last 168 hours.  No results for input(s): LIPASE, AMYLASE in the last 168 hours. No results for input(s): AMMONIA in the last 168 hours. Coagulation Profile: No results for input(s): INR, PROTIME in the last 168 hours. Cardiac Enzymes: No results for input(s): CKTOTAL, CKMB, CKMBINDEX, TROPONINI in the last 168 hours. BNP (last 3 results) No results for input(s): PROBNP in the last 8760 hours. HbA1C: No results for input(s): HGBA1C in the last 72 hours.  CBG: No results for input(s): GLUCAP in the last 168 hours. Lipid Profile: No results for  input(s): CHOL, HDL, LDLCALC, TRIG, CHOLHDL, LDLDIRECT in the last 72 hours. Thyroid Function Tests: No results for input(s): TSH, T4TOTAL, FREET4, T3FREE, THYROIDAB in the last 72 hours. Anemia Panel: No results for input(s): VITAMINB12, FOLATE, FERRITIN, TIBC, IRON, RETICCTPCT in the last 72 hours. Sepsis Labs: No results for input(s): PROCALCITON, LATICACIDVEN in the last 168 hours.  Recent Results (from the past 240 hours)  Surgical PCR screen     Status: None   Collection Time: 03/24/24  1:09 PM   Specimen: Nasal Mucosa; Nasal Swab  Result Value Ref Range Status   MRSA, PCR NEGATIVE NEGATIVE Final   Staphylococcus aureus NEGATIVE NEGATIVE Final    Comment: (NOTE) The Xpert SA Assay (FDA approved for NASAL specimens in patients 67 years of age and older), is one component of a comprehensive surveillance program. It is not intended to diagnose infection nor to guide or monitor treatment. Performed at [facility], [address], [place], [place] [address]      Radiology Studies: No  results found.   Scheduled Meds:  atorvastatin   40 mg Oral QPM   dexamethasone  (DECADRON ) injection  2 mg Intravenous Q6H   Or   dexamethasone   2 mg Oral Q6H   docusate sodium   100 mg Oral BID   ezetimibe   10 mg Oral Daily   lidocaine   1 Application Topical Once   metoprolol  succinate  25 mg Oral QPM   naloxegol oxalate  12.5 mg Oral Daily   polyethylene glycol  17 g Oral Daily   senna  1 tablet Oral BID   senna-docusate  2 tablet Oral QHS   sodium chloride  flush  3 mL Intravenous Q12H   Continuous Infusions:     LOS: 10 days   Fredia Skeeter, MD [facility]  04/01/2024, 8:14 AM   *Please note that this is a verbal dictation therefore any spelling or grammatical errors are due to the Dragon Medical One system interpretation.  Please page via Amion and do not message via secure chat for urgent patient care matters. Secure chat can  be used for non urgent patient care matters.  How to contact the [facility] Attending or Consulting provider 7A - 7P or covering provider during after hours 7P -7A, for this patient?  Check the care team in [facility] and look for a) attending/consulting TR[facility] provider listed and b) the [facility] team listed. Page or secure chat 7A-7P. Log into [email or web address] and use [place]'s universal password to access. If you do not have the password, please contact the hospital operator. Locate the [facility] provider you are looking for under [facility] and page to a number that you can be directly reached. If you still have difficulty reaching the provider, please page the [facility] (Director on Call) for the Hospitalists listed on amion for assistance.

## 2024-04-01 NOTE — [place] FL2 (Signed)
 [place]  MEDICAID FL2 LEVEL OF CARE FORM     IDENTIFICATION  Patient Name: Lynn Hogan Birthdate: Dec 28, 1945 Sex: female Admission Date (Current Location): 03/21/2024  [facility] and [place] Number:  Producer, television/film/video and Address:  [facility], [address] [place], [place] [address]      Provider Number: [id number]  Attending Physician Name and Address:  Vernon Ranks, MD  Relative Name and Phone Number:  Daye,Julie Daughter   [phone]    Current Level of Care: Hospital Recommended Level of Care: Skilled Nursing Facility Prior Approval Number:    Date Approved/Denied:   PASRR Number: [id number]  Discharge Plan: SNF    Current Diagnoses: Patient Active Problem List   Diagnosis Date Noted   Status post thoracic spinal fusion 03/25/2024   Spinal cord mass (HCC) 03/21/2024   Bone lesion 01/25/2024   History of heart failure 01/25/2024   Hypertension 01/25/2024   Ischemic cardiomyopathy 01/25/2024   Malignant melanoma of left forearm (HCC) 02/12/2022   CAD (coronary artery disease) 08/11/2018   Acute systolic heart failure (HCC) 08/11/2018   STEMI (ST elevation myocardial infarction) (HCC) 08/08/2018   STEMI involving left anterior descending coronary artery (HCC) 08/08/2018   NSTEMI (non-ST elevated myocardial infarction) (HCC) 07/31/2018   Hyperlipidemia 07/31/2018   Arthritis, multiple joint involvement 07/31/2018   Disc degeneration, lumbar 09/08/2017   Spondylolisthesis, lumbar region 09/08/2017    Orientation RESPIRATION BLADDER Height & Weight     Self, Situation, Place, Time  Normal Continent Weight: 80.7 kg Height:  5' 1 (154.9 cm)  BEHAVIORAL SYMPTOMS/MOOD NEUROLOGICAL BOWEL NUTRITION STATUS      Continent Diet (regular with thin liquids)  AMBULATORY STATUS COMMUNICATION OF NEEDS Skin   Limited Assist Verbally Other (Comment) (surgical incision to back)                       Personal Care Assistance Level of  Assistance  Bathing, Feeding, Dressing Bathing Assistance: Limited assistance Feeding assistance: Independent Dressing Assistance: Limited assistance     Functional Limitations Info  Sight, Hearing, Speech Sight Info: Adequate Hearing Info: Adequate Speech Info: Adequate    SPECIAL CARE FACTORS FREQUENCY  PT (By licensed PT), OT (By licensed OT)     PT Frequency: 5x/wk OT Frequency: 5x/wk            Contractures Contractures Info: Not present    Additional Factors Info  Code Status, Allergies Code Status Info: Full Allergies Info: Ibuprofen/ Nsaids           Current Medications (04/01/2024):  This is the current hospital active medication list Current Facility-Administered Medications  Medication Dose Route Frequency Provider Last Rate Last Admin   acetaminophen  (TYLENOL ) tablet 1,000 mg  1,000 mg Oral Q8H PRN Pahwani, Ravi, MD       atorvastatin  (LIPITOR ) tablet 40 mg  40 mg Oral QPM [name], MD   40 mg at 03/31/24 1723   bisacodyl  (DULCOLAX) suppository 10 mg  10 mg Rectal Daily PRN [name], MD       dexamethasone  (DECADRON ) injection 2 mg  2 mg Intravenous Q8H [name], MD       Or   dexamethasone  (DECADRON ) tablet 2 mg  2 mg Oral Q8H [name], MD       docusate sodium  (COLACE) capsule 100 mg  100 mg Oral BID [name], MD   100 mg at 03/31/24 9170   ezetimibe  (ZETIA )  tablet 10 mg  10 mg Oral Daily [name], MD   10 mg at 04/01/24 1137   hydrALAZINE  (APRESOLINE ) injection 10 mg  10 mg Intravenous Q6H PRN Pahwani, Ravi, MD   10 mg at 04/01/24 0801   lidocaine  (XYLOCAINE ) 2 % jelly 1 Application  1 Application Topical Once Pahwani, Ravi, MD       menthol  (CEPACOL) lozenge 3 mg  1 lozenge Oral PRN [name], MD       Or   phenol (CHLORASEPTIC) mouth spray 1 spray  1 spray Mouth/Throat PRN [name], MD       methocarbamol  (ROBAXIN ) tablet 500 mg  500 mg Oral Q6H PRN [name], MD        Or   methocarbamol  (ROBAXIN ) injection 500 mg  500 mg Intravenous Q6H PRN [name], MD   500 mg at 04/01/24 1459   metoprolol  succinate (TOPROL -XL) 24 hr tablet 25 mg  25 mg Oral QPM Pahwani, Ravi, MD       naloxegol oxalate (MOVANTIK) tablet 12.5 mg  12.5 mg Oral Daily [name], MD   12.5 mg at 03/31/24 1048   ondansetron  (ZOFRAN ) tablet 4 mg  4 mg Oral Q6H PRN [name], MD   4 mg at 03/31/24 1449   Or   ondansetron  (ZOFRAN ) injection 4 mg  4 mg Intravenous Q6H PRN [name], MD   4 mg at 03/31/24 9342   oxyCODONE  (Oxy IR/ROXICODONE ) immediate release tablet 5 mg  5 mg Oral Q4H PRN Pahwani, Ravi, MD   5 mg at 03/31/24 1449   polyethylene glycol (MIRALAX  / GLYCOLAX ) packet 17 g  17 g Oral Daily [name], MD   17 g at 03/31/24 9170   senna (SENOKOT) tablet 8.6 mg  1 tablet Oral BID [name], MD   8.6 mg at 03/31/24 9170   senna-docusate (Senokot-S) tablet 2 tablet  2 tablet Oral QHS [name], MD   2 tablet at 03/29/24 2120   sodium chloride  flush (NS) 0.9 % injection 3 mL  3 mL Intravenous Q12H [name], MD   3 mL at 03/31/24 2340   sodium chloride  flush (NS) 0.9 % injection 3 mL  3 mL Intravenous PRN [name], MD         Discharge Medications: Please see discharge summary for a list of discharge medications.  Relevant Imaging Results:  Relevant Lab Results:   Additional Information SS#: [id number]  Andrez JULIANNA George, RN

## 2024-04-01 NOTE — TOC Progression Note (Signed)
 Transition of Care [facility]) - Progression Note    Patient Details  Name: Lynn Hogan MRN: [id number] Date of Birth: 08-27-1945  [facility] CM/SW Contact  Andrez JULIANNA George, RN Phone Number: 04/01/2024, 4:14 PM  Clinical Narrative:     Pt has been denied for CIR. Pt is willing to have rehab at [facility]. CM has sent her FL2 and updated admission at [facility] to please review.  IP Care management following.  Expected Discharge Plan: IP Rehab Facility Barriers to Discharge: Continued Medical Work up               Expected Discharge Plan and Services   Discharge Planning Services: CM Consult   Living arrangements for the past 2 months: Single Family Home                                       Social Drivers of Health (SDOH) Interventions SDOH Screenings   Food Insecurity: No Food Insecurity (03/22/2024)  Housing: Low Risk  (03/22/2024)  Transportation Needs: No Transportation Needs (03/22/2024)  Utilities: Not At Risk (03/22/2024)  Financial Resource Strain: Low Risk  (08/01/2018)  Physical Activity: Sufficiently Active (08/01/2018)  Social Connections: Socially Isolated (03/22/2024)  Stress: No Stress Concern Present (08/01/2018)  Tobacco Use: Low Risk  (03/25/2024)    Readmission Risk Interventions     No data to display

## 2024-04-01 NOTE — Progress Notes (Signed)
 PT Cancellation Note  Patient Details Name: Lynn Hogan MRN: [id number] DOB: [birth year]   Cancelled Treatment:    Reason Eval/Treat Not Completed: (P) Pain limiting ability to participate (Pt reports increased pain and declines mobility at this time. Will continue to follow per PT POC.)   [name] 04/01/2024, 3:22 PM

## 2024-04-01 NOTE — Plan of Care (Signed)
  Problem: Education: Goal: Knowledge of General Education information will improve Description: Including pain rating scale, medication(s)/side effects and non-pharmacologic comfort measures Outcome: Progressing   Problem: Health Behavior/Discharge Planning: Goal: Ability to manage health-related needs will improve Outcome: Progressing   Problem: Clinical Measurements: Goal: Ability to maintain clinical measurements within normal limits will improve Outcome: Progressing Goal: Will remain free from infection Outcome: Progressing Goal: Diagnostic test results will improve Outcome: Progressing Goal: Respiratory complications will improve Outcome: Progressing Goal: Cardiovascular complication will be avoided Outcome: Progressing   Problem: Nutrition: Goal: Adequate nutrition will be maintained Outcome: Progressing   Problem: Coping: Goal: Level of anxiety will decrease Outcome: Progressing   Problem: Elimination: Goal: Will not experience complications related to bowel motility Outcome: Progressing Goal: Will not experience complications related to urinary retention Outcome: Progressing   Problem: Pain Managment: Goal: General experience of comfort will improve and/or be controlled Outcome: Progressing   Problem: Safety: Goal: Ability to remain free from injury will improve Outcome: Progressing   Problem: Skin Integrity: Goal: Risk for impaired skin integrity will decrease Outcome: Progressing   Problem: Education: Goal: Ability to verbalize activity precautions or restrictions will improve Outcome: Progressing Goal: Knowledge of the prescribed therapeutic regimen will improve Outcome: Progressing Goal: Understanding of discharge needs will improve Outcome: Progressing   Problem: Activity: Goal: Ability to avoid complications of mobility impairment will improve Outcome: Progressing Goal: Ability to tolerate increased activity will improve Outcome:  Progressing Goal: Will remain free from falls Outcome: Progressing   Problem: Bowel/Gastric: Goal: Gastrointestinal status for postoperative course will improve Outcome: Progressing   Problem: Clinical Measurements: Goal: Ability to maintain clinical measurements within normal limits will improve Outcome: Progressing Goal: Postoperative complications will be avoided or minimized Outcome: Progressing Goal: Diagnostic test results will improve Outcome: Progressing   Problem: Pain Management: Goal: Pain level will decrease Outcome: Progressing   Problem: Skin Integrity: Goal: Will show signs of wound healing Outcome: Progressing   Problem: Health Behavior/Discharge Planning: Goal: Identification of resources available to assist in meeting health care needs will improve Outcome: Progressing   Problem: Bladder/Genitourinary: Goal: Urinary functional status for postoperative course will improve Outcome: Progressing

## 2024-04-02 DIAGNOSIS — C8330 Diffuse large B-cell lymphoma, unspecified site: Secondary | ICD-10-CM | POA: Diagnosis not present

## 2024-04-02 DIAGNOSIS — E785 Hyperlipidemia, unspecified: Secondary | ICD-10-CM

## 2024-04-02 DIAGNOSIS — G9589 Other specified diseases of spinal cord: Secondary | ICD-10-CM | POA: Diagnosis not present

## 2024-04-02 DIAGNOSIS — Z981 Arthrodesis status: Secondary | ICD-10-CM

## 2024-04-02 DIAGNOSIS — Z8679 Personal history of other diseases of the circulatory system: Secondary | ICD-10-CM

## 2024-04-02 DIAGNOSIS — I2510 Atherosclerotic heart disease of native coronary artery without angina pectoris: Secondary | ICD-10-CM

## 2024-04-02 DIAGNOSIS — I10 Essential (primary) hypertension: Secondary | ICD-10-CM

## 2024-04-02 DIAGNOSIS — M549 Dorsalgia, unspecified: Secondary | ICD-10-CM

## 2024-04-02 DIAGNOSIS — M899 Disorder of bone, unspecified: Secondary | ICD-10-CM | POA: Diagnosis not present

## 2024-04-02 NOTE — Plan of Care (Signed)
  Problem: Education: Goal: Knowledge of General Education information will improve Description: Including pain rating scale, medication(s)/side effects and non-pharmacologic comfort measures Outcome: Progressing   Problem: Health Behavior/Discharge Planning: Goal: Ability to manage health-related needs will improve Outcome: Progressing   Problem: Clinical Measurements: Goal: Ability to maintain clinical measurements within normal limits will improve Outcome: Progressing Goal: Will remain free from infection Outcome: Progressing Goal: Diagnostic test results will improve Outcome: Progressing Goal: Respiratory complications will improve Outcome: Progressing Goal: Cardiovascular complication will be avoided Outcome: Progressing   Problem: Nutrition: Goal: Adequate nutrition will be maintained Outcome: Progressing   Problem: Coping: Goal: Level of anxiety will decrease Outcome: Progressing   Problem: Elimination: Goal: Will not experience complications related to bowel motility Outcome: Progressing Goal: Will not experience complications related to urinary retention Outcome: Progressing   Problem: Pain Managment: Goal: General experience of comfort will improve and/or be controlled Outcome: Progressing   Problem: Safety: Goal: Ability to remain free from injury will improve Outcome: Progressing   Problem: Skin Integrity: Goal: Risk for impaired skin integrity will decrease Outcome: Progressing   Problem: Education: Goal: Ability to verbalize activity precautions or restrictions will improve Outcome: Progressing Goal: Knowledge of the prescribed therapeutic regimen will improve Outcome: Progressing Goal: Understanding of discharge needs will improve Outcome: Progressing   Problem: Activity: Goal: Ability to avoid complications of mobility impairment will improve Outcome: Progressing Goal: Ability to tolerate increased activity will improve Outcome:  Progressing Goal: Will remain free from falls Outcome: Progressing   Problem: Bowel/Gastric: Goal: Gastrointestinal status for postoperative course will improve Outcome: Progressing   Problem: Clinical Measurements: Goal: Ability to maintain clinical measurements within normal limits will improve Outcome: Progressing Goal: Postoperative complications will be avoided or minimized Outcome: Progressing Goal: Diagnostic test results will improve Outcome: Progressing   Problem: Pain Management: Goal: Pain level will decrease Outcome: Progressing   Problem: Skin Integrity: Goal: Will show signs of wound healing Outcome: Progressing   Problem: Health Behavior/Discharge Planning: Goal: Identification of resources available to assist in meeting health care needs will improve Outcome: Progressing   Problem: Bladder/Genitourinary: Goal: Urinary functional status for postoperative course will improve Outcome: Progressing

## 2024-04-02 NOTE — Progress Notes (Signed)
 Physical Therapy Treatment Patient Details Name: Lynn Hogan MRN: [id number] DOB: 01/01/1946 Today's Date: 04/02/2024   History of Present Illness Pt is a 78 y.o. female presenting 9/22 with progressive back pain. 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 S/p decompressive thoracic laminectomy/medial facetectomy, foraminotomy T8-11 with posterolateral arthridesis T7-12, allograft, segmental fixation T7-12. 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 Pt demonstrating bed mobility, transfers and gait with RW for support with grossly CGA for safety with cues throughout for adherence to all precautions. Pt able to verbally recall, but demonstrates poor carryover during functional mobility. Pt continues to be limited by decreased activity tolerance, requiring multiple standing rest breaks during ambulation. Pt returning to supine at end of session due to fatigue. Pt continues to benefit from skilled PT services to progress toward functional mobility goals.     If plan is discharge home, recommend the following: A lot of help with bathing/dressing/bathroom;Help with stairs or ramp for entrance;Assist for transportation;A little help with walking and/or transfers   Can travel by private vehicle        Equipment Recommendations  None recommended by PT    Recommendations for Other Services       Precautions / Restrictions Precautions Precautions: Back;Fall Precaution Booklet Issued: Yes (comment) Recall of Precautions/Restrictions: Intact Precaution/Restrictions Comments: pt able to recall all, light cues for adherence during mobility Required Braces or Orthoses: Spinal Brace Spinal Brace: Thoracolumbosacral  orthotic;Applied in sitting position Restrictions Weight Bearing Restrictions Per Provider Order: No     Mobility  Bed Mobility Overal bed mobility: Needs Assistance Bed Mobility: Supine to Sit, Sit to Supine     Supine to sit: HOB elevated, Used rails, Contact guard Sit to supine: Contact guard assist, HOB elevated, Used rails   General bed mobility comments: Inc effort to bring L>RLE into bed for sit>supine, pt preferring to transition to long sitting then turning to face EOB rather than traditional log roll technique    Transfers Overall transfer level: Needs assistance Equipment used: Rolling walker (2 wheels) Transfers: Sit to/from Stand Sit to Stand: Contact guard assist           General transfer comment: Inc effort & multiple attempts to come to standing, good recall for hand placement    Ambulation/Gait Ambulation/Gait assistance: Contact guard assist Gait Distance (Feet): 15 Feet (+ 175') Assistive device: Rolling walker (2 wheels) Gait Pattern/deviations: Step-through pattern, Decreased stride length Gait velocity: decreased     General Gait Details: Slow, guarded gait. Cues for upright posture and shoulder depression. CGA for safety, but no overt LOB, multiple standing rest breaks due to UE fatigue   Stairs             Wheelchair Mobility     Tilt Bed    Modified Rankin (Stroke Patients Only)       Balance Overall balance assessment: Needs assistance Sitting-balance support: Feet supported, Single extremity supported Sitting balance-Leahy Scale: Good Sitting balance - Comments: EOB; intermittently reliant on BUE for support due to fluctuating pain   Standing balance support: Bilateral upper extremity supported, Reliant on assistive device for balance, During functional activity Standing balance-Leahy Scale: Poor Standing balance comment: reliant on RW; seldom able to remove one UE from RW/sink  to open doors, perform grooming, etc.                             Communication Communication Communication: No apparent difficulties  Cognition Arousal: Alert Behavior During Therapy: WFL for tasks assessed/performed   PT - Cognitive impairments: No apparent impairments                         Following commands: Intact      Cueing Cueing Techniques: Verbal cues, Gestural cues, Tactile cues  Exercises      General Comments        Pertinent Vitals/Pain Pain Assessment Pain Assessment: Faces Faces Pain Scale: Hurts a little bit Pain Location: back around incision site Pain Descriptors / Indicators: Discomfort Pain Intervention(s): Monitored during session, Limited activity within patient's tolerance    Home Living                          Prior Function            PT Goals (current goals can now be found in the care plan section) Acute Rehab PT Goals Patient Stated Goal: get better, go to rehab, and go home PT Goal Formulation: With patient Time For Goal Achievement: 04/09/24 Progress towards PT goals: Progressing toward goals    Frequency    Min 5X/week      PT Plan      Co-evaluation              AM-PAC PT 6 Clicks Mobility   Outcome Measure  Help needed turning from your back to your side while in a flat bed without using bedrails?: A Little Help needed moving from lying on your back to sitting on the side of a flat bed without using bedrails?: A Little Help needed moving to and from a bed to a chair (including a wheelchair)?: A Little Help needed standing up from a chair using your arms (e.g., wheelchair or bedside chair)?: A Little Help needed to walk in hospital room?: A Little Help needed climbing 3-5 steps with a railing? : A Lot 6 Click Score: 17    End of Session Equipment Utilized During Treatment: Gait belt;Back brace Activity Tolerance: Patient tolerated treatment well Patient left: in bed;with call bell/phone within reach;with bed alarm  set Nurse Communication: Mobility status PT Visit Diagnosis: Unsteadiness on feet (R26.81);Muscle weakness (generalized) (M62.81);Difficulty in walking, not elsewhere classified (R26.2)     Time: 1324-1350 PT Time Calculation (min) (ACUTE ONLY): 26 min  Charges:    $Gait Training: 8-22 mins $Therapeutic Activity: 8-22 mins PT General Charges $$ ACUTE PT VISIT: 1 Visit                     Starlena Beil R. PTA Acute Rehabilitation Services Office: [phone]   Therisa CHRISTELLA Boor 04/02/2024, 1:59 PM

## 2024-04-02 NOTE — Progress Notes (Signed)
 [facility]  PROGRESS NOTE  Lynn Hogan FMW:[id number] DOB: 03/31/1946 DOA: 03/21/2024 PCP: Joshua Franky Sharper, PA-C   Brief HPI:    78 y.o. 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  MRI showed metastatic disease at T9 and T10 as well as T8.  Seen by neurosurgery and oncology.     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 CT chest abdomen pelvis shows no primary source was found.  MRI brain did not show any metastatic disease. Seen by oncology, they recommended decompression surgery for which patient refused initially but eventually agreed and underwent surgical decompression by neurosurgery on 03/25/2024, surgical pathology report still pending.  Doing well postoperatively.  Neurosurgery recommends supportive care, PT OT, Don brace when out of bed.  Drain removed 03/28/2024. Seen by PT OT, CIR recommended.  Patient remains on dexamethasone  per neurosurgery.  Medically stable.  Pending placement to skilled nursing facility for rehab -Continue oxycodone  as needed for pain   History of Melanoma Malignant melanoma of L forearm 03/13/2022    Hypertension/sinus bradycardia Blood pressure mostly however she has sinus bradycardia which is asymptomatic, currently on Toprol -XL 50 mg, reduced to 25 mg p.o. daily today.     Dyslipidemia Lipitor , zetia    CAD Stable Prediabetes: Hemoglobin A1c in chart 5 years ago was 5.7 and again 5.7 this hospitalization.  No need of SSI.   Constipation: Resolved.  She is on scheduled Colace, senna and MiraLAX  daily.  She is also on as needed Dulcolax  suppository.           Medications     atorvastatin   40 mg Oral QPM   docusate sodium   100 mg Oral BID   ezetimibe   10 mg Oral Daily   lidocaine   1 Application Topical Once   metoprolol  succinate  25 mg Oral QPM   naloxegol oxalate  12.5 mg Oral Daily   polyethylene glycol  17 g Oral Daily   senna  1 tablet Oral BID   senna-docusate  2 tablet Oral QHS   sodium chloride  flush  3 mL Intravenous Q12H     Data Reviewed:   CBG:  No results for input(s): GLUCAP in the last 168 hours.  SpO2: 91 % O2 Flow Rate (L/min): 2 L/min    Vitals:   04/01/24 2058 04/02/24 0041 04/02/24 0512 04/02/24 0749  BP: (!) 117/59 (!) 119/56 110/60 (!) 116/52  Pulse: 62 (!) 57 (!) 51 (!) 44  Resp: 20 16 16 17   Temp: 98.7 F (37.1 C) 97.6 F (36.4 C) 97.6 F (36.4 C) 97.8 F (36.6 C)  TempSrc: Oral Oral Oral Oral  SpO2: 100% 93% 92% 91%  Weight:      Height:          Data Reviewed:  Basic Metabolic Panel: Recent Labs  Lab 03/30/24 0837  NA 136  K 3.9  CL 101  CO2 27  GLUCOSE 99  BUN 13  CREATININE 0.62  CALCIUM  8.7*    CBC: Recent Labs  Lab 03/30/24 0837  WBC 10.2  NEUTROABS 6.2  HGB 11.8*  HCT 36.1  MCV 94.8  PLT 363    LFT No results for input(s): AST, ALT,  ALKPHOS, BILITOT, PROT, ALBUMIN in the last 168 hours.   Antibiotics: Anti-infectives (From admission, onward)    Start     Dose/Rate Route Frequency Ordered Stop   03/25/24 2300  ceFAZolin  (ANCEF ) IVPB 2g/100 mL premix        2 g 200 mL/hr over 30 Minutes Intravenous Every 8 hours 03/25/24 2033 03/26/24 0741        DVT prophylaxis: SCDs  Code Status: Full code  Family Communication: No family at bedside   CONSULTS    Subjective   Pain is better controlled now.   Objective    Physical Examination:   General-appears in no acute distress Heart-S1-S2, regular, no murmur auscultated Lungs-clear to auscultation bilaterally, no wheezing or crackles  auscultated Abdomen-soft, nontender, no organomegaly Extremities-no edema in the lower extremities Neuro-alert, oriented x3, no focal deficit noted  Status is: Inpatient:             Sabas GORMAN Brod   [facility] If 7PM-7AM, please contact night-coverage at [email or web address], Office  [phone]   04/02/2024, 8:33 AM  LOS: 11 days

## 2024-04-02 NOTE — Progress Notes (Signed)
 Patient ID: Lynn Hogan, female   DOB: 1945/12/12, 78 y.o.   MRN: [id number] She is awake and alert.  Pain seems to be better controlled.  Her incision is clean dry and intact.  Awaiting rehab.  Still awaiting pathology.

## 2024-04-03 DIAGNOSIS — M549 Dorsalgia, unspecified: Secondary | ICD-10-CM | POA: Diagnosis not present

## 2024-04-03 DIAGNOSIS — G9589 Other specified diseases of spinal cord: Secondary | ICD-10-CM | POA: Diagnosis not present

## 2024-04-03 DIAGNOSIS — C8330 Diffuse large B-cell lymphoma, unspecified site: Secondary | ICD-10-CM | POA: Diagnosis not present

## 2024-04-03 DIAGNOSIS — E785 Hyperlipidemia, unspecified: Secondary | ICD-10-CM | POA: Diagnosis not present

## 2024-04-03 NOTE — Progress Notes (Signed)
 HEMATOLOGY IP PROGRESS NOTE  DOS 04/02/2024  Subjective:   Lynn Hogan is seen in medical oncology follow-up at the request of Dr. [name] for her new spinal tumor. She has a history of left forearm melanoma that was resected in 2023.  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  Patient notes her back pain is much better today and she is needing less narcotics.  Has been recommended to use a brace when out of bed.  Notes no new focal neurological deficits in her lower extremities.  No new headaches or other focal neurological problems.. No new headaches. Some constipation from the use of narcotics and some nausea for which she is taking nausea medications.  Objective: Vital signs in last 24 hours: Blood pressure 119/79, pulse (!) 51, temperature 98 F (36.7 C), temperature source Oral, resp. rate 16, height 5' 1 (1.549 m), weight 177 lb 14.6 oz (80.7 kg), SpO2 93%.  Intake/Output from previous day: No intake/output data recorded.  Physical Exam: .BP 119/79 (BP Location: Right Arm)   Pulse (!) 51   Temp 98 F (36.7 C) (Oral)   Resp 16   Ht 5' 1 (1.549 m)   Wt 177 lb 14.6 oz (80.7 kg)   SpO2 93%   BMI 33.62 kg/m  NAD GENERAL:alert, in no acute distress and comfortable SKIN: no acute rashes, no significant lesions EYES: conjunctiva are pink and non-injected, sclera anicteric OROPHARYNX: MMM, no exudates, no oropharyngeal erythema or ulceration NECK: supple, no JVD LYMPH:  no palpable lymphadenopathy in the cervical, axillary or inguinal regions LUNGS: clear to auscultation b/l with normal respiratory effort HEART: regular rate & rhythm ABDOMEN:  normoactive bowel sounds , non tender, not distended. Extremity: no pedal edema PSYCH: alert & oriented x 3 with fluent speech NEURO: no focal motor/sensory deficits    Lab  Results: .    Latest Ref Rng & Units 03/30/2024    8:37 AM 03/26/2024    4:39 AM 03/24/2024    4:57 AM  CBC  WBC 4.0 - 10.5 K/uL 10.2  10.1  6.8   Hemoglobin 12.0 - 15.0 g/dL 88.1  87.9  86.7   Hematocrit 36.0 - 46.0 % 36.1  36.9  40.8   Platelets 150 - 400 K/uL 363  383  352    .    Latest Ref Rng & Units 03/30/2024    8:37 AM 03/26/2024    4:39 AM 03/24/2024    4:57 AM  CMP  Glucose 70 - 99 mg/dL 99  846  861   BUN 8 - 23 mg/dL 13  11  14    Creatinine 0.44 - 1.00 mg/dL 9.37  9.15  9.22   Sodium 135 - 145 mmol/L 136  136  136   Potassium 3.5 - 5.1 mmol/L 3.9  4.0  4.0   Chloride 98 - 111 mmol/L 101  105  103   CO2 22 - 32 mmol/L 27  23  21    Calcium  8.9 - 10.3 mg/dL 8.7  8.6  9.3    .No results found for: LDH   Medications: I have reviewed the patient's current medications.  Assessment/Plan:  Melanoma 03/13/2022 left forearm malignant melanoma, nodular type, ulcerated, 2.2 x 1.4 x 0.8 cm, Breslow thickness 14 mm, Clark level V, stage IIc (pT4b pN0 ( 03/28/2022 needle biopsy left axillary lymph node-lymphoid tissue, no evidence of malignancy  04/24/2022 left forearm reexcision-no evidence of residual melanoma, 2 left axillary sentinel nodes negative for malignancy 11/23/2022 PET: Tracer avid lytic bone lesions at T9 and T10 03/21/2024 CT thoracic spine: Progression of lytic osseous metastatic disease at T9 with significant posterior extension into the spinal canal, mild progressive tumor at the superior endplate of T10 03/21/2024 MRI thoracic and lumbar spine: Metastatic disease at T9 with extension to T10, extraosseous extension into the spinal canal and paraspinal soft tissues, moderate to severe canal stenosis at T9, small T8 metastasis 03/22/2024 CTs: Left apical pleural-based nodules favored scarring, no evidence of a primary malignancy or soft tissue metastasis, T9 and T10 metastases 03/24/2024 MRI brain: No evidence of metastatic disease, 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 3.  Hyperlipidemia 4.  History of coronary artery disease, NSTEMI 2020, STEMI 2020 5.  Hypertension 6.  History of CHF 7.  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 - Will need systemic therapies after recovery from surgery based on the type of lymphoma. - On steroids per neurosurgery with the plan to wean. Will need outpatient PET CT scan for staging Dr. Deanne will follow-up on Monday to define specific treatment plans once pathology results are available. Appreciate excellent care from hospital medicine team.   LOS: 12 days   The total time spent in the appointment was 35 minutes*.  All of the patient's questions were answered with apparent satisfaction. The patient knows to call the clinic with any problems, questions or concerns.   Emaline Saran MD MS AAHIVMS [facility] Hematology/Oncology Physician [facility]  .*Total Encounter Time as defined by the Centers for Medicare and Medicaid Services includes, in addition to the face-to-face time of a patient visit (documented in the note above) non-face-to-face time: obtaining and reviewing outside history, ordering and reviewing medications, tests or procedures, care coordination (communications with other health care professionals or caregivers) and documentation in the medical record.

## 2024-04-03 NOTE — Progress Notes (Signed)
 PROGRESS NOTE    Lynn Hogan  FMW:[id number] DOB: Apr 24, 1946 DOA: 03/21/2024 PCP: Joshua Franky Sharper, PA-C  Chief Complaint  Patient presents with   Back Pain    Brief Narrative:    78 y.o. 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  MRI showed metastatic disease at T9 and T10 as well as T8.  Seen by neurosurgery and oncology.   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  Seen by oncology, appreciate continued assistance - Dr. Deanne to follow Monday Now s/p decompressive thoracic laminectomy and medial facetectomy and foraminotomy at T8-9, T9-10, T10-11 with transpedicular decompression T9 on the R with removal of epidural tumor.  Posterolateral arthrodesis T7-T12 ultizing locally harvested morselized autologous bone graft and morselized allograft.  Segmental fixation T7-T12 utilizing alphatec pedicle screws.   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  Decompressive thoracic laminectomy and medial facetectomy and foraminotomy T8-9 T9-10 T10-11 with transpedicular decompression T9 on the right with removal of epidural tumor, 2.  Posterolateral arthrodesis T7-T12 utilizing locally harvested morselized autologous bone graft and morselized allograft, 3.  Segmental fixation T7-T12 utilizing Alphatec pedicle screws   Antimicrobials:  Anti-infectives (From admission, onward)    Start     Dose/Rate Route Frequency Ordered Stop   03/25/24 2300  ceFAZolin  (ANCEF ) IVPB 2g/100 mL premix        2 g 200 mL/hr over 30 Minutes Intravenous Every 8 hours 03/25/24 2033 03/26/24 0741       Subjective: Pain well controlled today  Objective: Vitals:   04/03/24 0016 04/03/24 0326 04/03/24 0852 04/03/24 1153  BP: 119/79 (!) 110/53 (!) 120/46 (!) 112/58  Pulse: (!) 51  (!) 56 61  Resp: 16 16 17 17   Temp: 98 F (36.7 C) 98.1 F (36.7 C) 98.4 F (36.9 C) 98.9 F (37.2 C)  TempSrc: Oral Oral Oral Oral  SpO2: 93% 92% 94% 95%  Weight:      Height:       No intake or output data in the 24 hours ending 04/03/24 1458 Filed Weights   03/21/24 1158 03/25/24 1242  Weight: 80.7 kg 80.7 kg    Examination:  General  exam: Appears calm and comfortable  Respiratory system: unlabored Cardiovascular system: RRR Gastrointestinal system: Abdomen is nondistended, soft and nontender Central nervous system: Alert and oriented. No focal neurological deficits. Extremities: no LEE   Data Reviewed: I have personally reviewed following labs and imaging studies  CBC: Recent Labs  Lab  03/30/24 0837  WBC 10.2  NEUTROABS 6.2  HGB 11.8*  HCT 36.1  MCV 94.8  PLT 363    Basic Metabolic Panel: Recent Labs  Lab 03/30/24 0837  NA 136  K 3.9  CL 101  CO2 27  GLUCOSE 99  BUN 13  CREATININE 0.62  CALCIUM  8.7*    GFR: Estimated Creatinine Clearance: 54.9 mL/min (by C-G formula based on SCr of 0.62 mg/dL).  Liver Function Tests: No results for input(s): AST, ALT, ALKPHOS, BILITOT, PROT, ALBUMIN in the last 168 hours.  CBG: No results for input(s): GLUCAP in the last 168 hours.   No results found for this or any previous visit (from the past 240 hours).       Radiology Studies: No results found.      Scheduled Meds:  atorvastatin   40 mg Oral QPM   docusate sodium   100 mg Oral BID   ezetimibe   10 mg Oral Daily   lidocaine   1 Application Topical Once   metoprolol  succinate  25 mg Oral QPM   naloxegol oxalate  12.5 mg Oral Daily   polyethylene glycol  17 g Oral Daily   senna  1 tablet Oral BID   senna-docusate  2 tablet Oral QHS   sodium chloride  flush  3 mL Intravenous Q12H   Continuous Infusions:   LOS: 79 days    Time spent: over 30 min     Meliton Monte, MD [facility]   To contact the attending provider between 7A-7P or the covering provider during after hours 7P-7A, please log into the web site [email or web address] and access using universal [place] password for that web site. If you do not have the password, please call the hospital operator.  04/03/2024, 2:58 PM

## 2024-04-03 NOTE — Progress Notes (Signed)
 HEMATOLOGY IP PROGRESS NOTE  DOS 04/03/2024  Subjective:   Lynn Hogan was seen in medical oncology follow-up at the behest of Dr. [name]. She notes her pain has been well-controlled and she is having good bowel movements.  No new headaches or focal neurological deficits.  No new leg weakness tingling or numbness. Good p.o. intake. Nausea from the opiates has now improved and resolved. She notes that she has been doing some reading about lymphomas.  She notes that we only have a pulmonary diagnosis at this time suggestive of lymphoma but hopefully the final pathology results will be available on Monday. No other acute new symptoms  Objective: Vital signs in last 24 hours: Blood pressure (!) 117/48, pulse (!) 53, temperature 98.8 F (37.1 C), temperature source Oral, resp. rate 17, height 5' 1 (1.549 m), weight 177 lb 14.6 oz (80.7 kg), SpO2 93%.  Intake/Output from previous day: No intake/output data recorded.  Physical Exam: .BP (!) 117/48 (BP Location: Right Arm)   Pulse (!) 53   Temp 98.8 F (37.1 C) (Oral)   Resp 17   Ht 5' 1 (1.549 m)   Wt 177 lb 14.6 oz (80.7 kg)   SpO2 93%   BMI 33.62 kg/m  NAD GENERAL:alert, in no acute distress and comfortable LYMPH:  no palpable lymphadenopathy in the cervical, axillary or inguinal regions LUNGS: clear to auscultation b/l with normal respiratory effort HEART: regular rate & rhythm ABDOMEN:  normoactive bowel sounds , non tender, not distended. Extremity: no pedal edema PSYCH: alert & oriented x 3 with fluent speech NEURO: no focal motor/sensory deficits    Lab Results: .    Latest Ref Rng & Units 03/30/2024    8:37 AM 03/26/2024    4:39 AM 03/24/2024    4:57 AM  CBC  WBC 4.0 - 10.5 K/uL 10.2  10.1  6.8   Hemoglobin 12.0 - 15.0 g/dL 88.1  87.9  86.7   Hematocrit 36.0 - 46.0 % 36.1  36.9  40.8   Platelets 150 - 400 K/uL 363  383  352    .    Latest Ref Rng & Units 03/30/2024    8:37 AM 03/26/2024    4:39 AM 03/24/2024     4:57 AM  CMP  Glucose 70 - 99 mg/dL 99  846  861   BUN 8 - 23 mg/dL 13  11  14    Creatinine 0.44 - 1.00 mg/dL 9.37  9.15  9.22   Sodium 135 - 145 mmol/L 136  136  136   Potassium 3.5 - 5.1 mmol/L 3.9  4.0  4.0   Chloride 98 - 111 mmol/L 101  105  103   CO2 22 - 32 mmol/L 27  23  21    Calcium  8.9 - 10.3 mg/dL 8.7  8.6  9.3    .No results found for: LDH   Medications: I have reviewed the patient's current medications.  Assessment/Plan:  Melanoma 03/13/2022 left forearm malignant melanoma, nodular type, ulcerated, 2.2 x 1.4 x 0.8 cm, Breslow thickness 14 mm, Clark level V, stage IIc (pT4b pN0 ( 03/28/2022 needle biopsy left axillary lymph node-lymphoid tissue, no evidence of malignancy 04/24/2022 left forearm reexcision-no evidence of residual melanoma, 2 left axillary sentinel nodes negative for malignancy 11/23/2022 PET: Tracer avid lytic bone lesions at T9 and T10 03/21/2024 CT thoracic spine: Progression of lytic osseous metastatic disease at T9 with significant posterior extension into the spinal canal, mild progressive tumor at the superior endplate of T10 03/21/2024  MRI thoracic and lumbar spine: Metastatic disease at T9 with extension to T10, extraosseous extension into the spinal canal and paraspinal soft tissues, moderate to severe canal stenosis at T9, small T8 metastasis 03/22/2024 CTs: Left apical pleural-based nodules favored scarring, no evidence of a primary malignancy or soft tissue metastasis, T9 and T10 metastases 03/24/2024 MRI brain: No evidence of metastatic disease, 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 3.  Hyperlipidemia 4.  History of coronary artery disease, NSTEMI 2020, STEMI 2020 5.  Hypertension 6.  History of CHF 7.  Basal cell carcinomas at the left ankle and right posterior arm excised 03/13/2022  PLAN - Patient notes  no acute new concerns since yesterday. - No new focal neurological deficits in her lower extremities.  No headaches. - Opiate related nausea and her back pain are well-controlled. - Hopefully we will have the final pathology result tomorrow to define further treatment plan, - Steroid taper per neurosurgery -Appreciate excellent hospital medicine cares -Dr. Arvella Hof will follow-up tomorrow to help define the final treatment plan once pathology results are available.   LOS: 12 days   The total time spent in the appointment was 25 minutes*.  All of the patient's questions were answered with apparent satisfaction. The patient knows to call the clinic with any problems, questions or concerns.   Lynn Saran MD MS AAHIVMS [facility] Hematology/Oncology Physician [facility]  .*Total Encounter Time as defined by the Centers for Medicare and Medicaid Services includes, in addition to the face-to-face time of a patient visit (documented in the note above) non-face-to-face time: obtaining and reviewing outside history, ordering and reviewing medications, tests or procedures, care coordination (communications with other health care professionals or caregivers) and documentation in the medical record.

## 2024-04-03 NOTE — Progress Notes (Signed)
    Providing Compassionate, Quality Care - Together   NEUROSURGERY PROGRESS NOTE     S: No issues overnight.    O: EXAM:  BP (!) 110/53 (BP Location: Right Arm)   Pulse (!) 51   Temp 98.1 F (36.7 C) (Oral)   Resp 16   Ht 5' 1 (1.549 m)   Wt 80.7 kg   SpO2 92%   BMI 33.62 kg/m     Awake, alert, oriented  Speech fluent, appropriate  Strength grossly intact BUE/BLE SILTx4 Incision c/d/i   ASSESSMENT:  78 y.o. s/p thoracic decompression/fusion secondary to pathologic fracture     PLAN: -Continue supportive care -Awaiting placement -Call w/ questions/concerns.   Camie Pickle, [facility]

## 2024-04-04 DIAGNOSIS — G9589 Other specified diseases of spinal cord: Secondary | ICD-10-CM | POA: Diagnosis not present

## 2024-04-04 LAB — SURGICAL PATHOLOGY

## 2024-04-04 NOTE — TOC Progression Note (Signed)
 Transition of Care [facility]) - Progression Note    Patient Details  Name: [name] MRN: [id number] Date of Birth: 1945/10/08  [facility] CM/SW Contact  Almarie CHRISTELLA Goodie, [place] Phone Number: 04/04/2024, 2:42 PM  Clinical Narrative:   CSW spoke with patient to update that [facility] declined. Patient unfamiliar with any other SNF options, but would like to be in the [place] area. CSW sent out referral, will follow up with bed offers.    Expected Discharge Plan: IP Rehab Facility Barriers to Discharge: Continued Medical Work up               Expected Discharge Plan and Services   Discharge Planning Services: CM Consult   Living arrangements for the past 2 months: Single Family Home                                       Social Drivers of Health (SDOH) Interventions SDOH Screenings   Food Insecurity: No Food Insecurity (03/22/2024)  Housing: Low Risk  (03/22/2024)  Transportation Needs: No Transportation Needs (03/22/2024)  Utilities: Not At Risk (03/22/2024)  Financial Resource Strain: Low Risk  (08/01/2018)  Physical Activity: Sufficiently Active (08/01/2018)  Social Connections: Socially Isolated (03/22/2024)  Stress: No Stress Concern Present (08/01/2018)  Tobacco Use: Low Risk  (03/25/2024)    Readmission Risk Interventions     No data to display

## 2024-04-04 NOTE — Progress Notes (Signed)
 PROGRESS NOTE    Lynn Hogan  FMW:[id number] DOB: [birth year] DOA: 03/21/2024 PCP: Joshua Franky Sharper, PA-C  Chief Complaint  Patient presents with   Back Pain    Brief Narrative:    78 y.o. female with medical history significant of malignant melanoma of the left forearm status post chemotherapy and surgery years ago, coronary artery disease, hyperlipidemia, essential hypertension, ischemic cardiomyopathy, degenerative disc disease who presented to the ER at [facility] with back pain.  MRI showed metastatic disease at T9 and T10 as well as T8.  Seen by neurosurgery and oncology.   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  Seen by oncology, appreciate continued assistance - Dr. Deanne to follow Monday Now s/p decompressive thoracic laminectomy and medial facetectomy and foraminotomy at T8-9, T9-10, T10-11 with transpedicular decompression T9 on the R with removal of epidural tumor.  Posterolateral arthrodesis T7-T12 ultizing locally harvested morselized autologous bone graft and morselized allograft.  Segmental fixation T7-T12 utilizing alphatec pedicle screws.   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  Decompressive thoracic laminectomy and medial facetectomy and foraminotomy T8-9 T9-10 T10-11 with transpedicular decompression T9 on the right with removal of epidural tumor, 2.  Posterolateral arthrodesis T7-T12 utilizing locally harvested morselized autologous bone graft and morselized allograft, 3.  Segmental fixation T7-T12 utilizing Alphatec pedicle screws   Antimicrobials:  Anti-infectives (From admission, onward)    Start     Dose/Rate Route Frequency Ordered Stop   03/25/24 2300  ceFAZolin  (ANCEF ) IVPB 2g/100 mL premix        2 g 200 mL/hr over 30 Minutes Intravenous Every 8 hours 03/25/24 2033 03/26/24 0741       Subjective: No complaints today  Objective: Vitals:   04/04/24 0006 04/04/24 0358 04/04/24 0735 04/04/24 1136  BP: (!) 109/51 (!) 116/55 (!) 117/58 (!) 121/44  Pulse: (!) 55 (!) 48 (!) 54 65  Resp: 18 18 14 16   Temp: 98 F (36.7 C) 97.7 F (36.5 C) 98.1 F (36.7 C) 98.3 F (36.8 C)  TempSrc: Oral Oral Oral Oral  SpO2: 93% 94% (!) 89% 96%  Weight:      Height:       No intake or output data in the 24 hours ending 04/04/24 1344 Filed Weights   03/21/24 1158 03/25/24 1242  Weight: 80.7 kg 80.7 kg    Examination:  General:  No acute distress. Lungs: unlabored Neurological: Alert and oriented 3. Moves all extremities 4 with equal strength. Cranial nerves II through XII grossly intact. Extremities: No clubbing or cyanosis. No edema.  Data Reviewed: I have personally reviewed following labs and imaging studies  CBC: Recent Labs  Lab 03/30/24 0837  WBC 10.2  NEUTROABS 6.2  HGB  11.8*  HCT 36.1  MCV 94.8  PLT 363    Basic Metabolic Panel: Recent Labs  Lab 03/30/24 0837  NA 136  K 3.9  CL 101  CO2 27  GLUCOSE 99  BUN 13  CREATININE 0.62  CALCIUM  8.7*    GFR: Estimated Creatinine Clearance: 54.9 mL/min (by C-G formula based on SCr of 0.62 mg/dL).  Liver Function Tests: No results for input(s): AST, ALT, ALKPHOS, BILITOT, PROT, ALBUMIN in the last 168 hours.  CBG: No results for input(s): GLUCAP in the last 168 hours.   No results found for this or any previous visit (from the past 240 hours).       Radiology Studies: No results found.      Scheduled Meds:  atorvastatin   40 mg Oral QPM   docusate sodium   100 mg Oral BID   ezetimibe   10 mg Oral Daily   lidocaine   1 Application Topical Once   metoprolol  succinate  25 mg Oral QPM   naloxegol oxalate  12.5 mg Oral Daily   polyethylene glycol  17 g Oral Daily   senna  1 tablet Oral BID   senna-docusate  2 tablet Oral QHS   sodium chloride  flush  3 mL Intravenous Q12H   Continuous Infusions:   LOS: 13 days    Time spent: over 30 min     Meliton Monte, MD [facility]   To contact the attending provider between 7A-7P or the covering provider during after hours 7P-7A, please log into the web site [email or web address] and access using universal [place] password for that web site. If you do not have the password, please call the hospital operator.  04/04/2024, 1:44 PM

## 2024-04-04 NOTE — Progress Notes (Signed)
 Occupational Therapy Treatment Patient Details Name: Lynn Hogan MRN: [id number] DOB: [birth year] Today's Date: 04/04/2024   History of present illness Pt is a 78 y.o. female presenting 9/22 with progressive back pain. 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 S/p decompressive thoracic laminectomy/medial facetectomy, foraminotomy T8-11 with posterolateral arthridesis T7-12, allograft, segmental fixation T7-12. 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 Pt transferred to RW with close supervision/CGA and inc effort with ongoing cueing to maintain precautions. Pt completing toileting and LB dressing with supervision and min A for TLSO mgmt. Progressing to further household distance mobility with RW, slow pace, intermittent cues for posture correction. AMPAC score 18/24 indicating improving functional performance. OT to continue to follow.      If plan is discharge home, recommend the following:  A little help with walking and/or transfers;Assist for transportation;Help with stairs or ramp for entrance;A little help with bathing/dressing/bathroom   Equipment Recommendations  Other (comment) (defer)    Recommendations for Other Services      Precautions / Restrictions Precautions Precautions: Back;Fall Recall of Precautions/Restrictions: Intact Required Braces or Orthoses: Spinal Brace Spinal Brace: Thoracolumbosacral orthotic;Applied in sitting position Restrictions Weight Bearing Restrictions Per Provider Order: No       Mobility Bed Mobility Overal bed mobility: Needs Assistance Bed Mobility: Supine to Sit, Sit to Supine     Supine to sit: HOB elevated, Used rails, Contact guard Sit to supine: Contact guard assist, HOB elevated, Used rails    General bed mobility comments: cued for technique to maintain precautions    Transfers Overall transfer level: Needs assistance Equipment used: Rolling walker (2 wheels) Transfers: Sit to/from Stand Sit to Stand: Contact guard assist           General transfer comment: Improved technique with wBOS, pt still pulls from walker despite OT cues for improved safety     Balance Overall balance assessment: Needs assistance Sitting-balance support: No upper extremity supported, Feet supported Sitting balance-Leahy Scale: Good Sitting balance - Comments: seated EOB   Standing balance support: Bilateral upper extremity supported, Reliant on assistive device for balance, During functional activity Standing balance-Leahy Scale: Fair Standing balance comment: improving pain/upright tolerance with unilateral UE support via RW when opening                           ADL either performed or assessed with clinical judgement   ADL Overall ADL's : Needs assistance/impaired     Grooming: Wash/dry hands;Supervision/safety;Contact guard assist;Standing Grooming Details (indicate cue type and reason): intermittent steadying assist for safety         Upper Body Dressing : Minimal assistance;Sitting;Cueing for sequencing;Cueing for compensatory techniques Upper Body Dressing Details (indicate cue type and reason): donning/doffing TLSO at EOB Lower Body Dressing: Supervision/safety;Cueing for compensatory techniques;Cueing for back precautions Lower Body Dressing Details (indicate cue type and reason): donned B socks via figure four method long sitting in bed Toilet Transfer: Contact guard assist;Ambulation;Rolling walker (2 wheels);Cueing for Chief of Staff Details (indicate cue type and reason): utilized wBOS for transfer technique with fair carryover of precautions, cued for safe approach to toilet with RW Toileting- Clothing Manipulation and Hygiene: Supervision/safety;Cueing  for compensatory techniques;Cueing for back precautions;Sitting/lateral lean;Sit to/from stand Toileting - Clothing Manipulation Details (indicate cue type  and reason): performed ant peri care seated on toilet            Extremity/Trunk Assessment              Vision       Perception     Praxis     Communication Communication Communication: No apparent difficulties   Cognition Arousal: Alert Behavior During Therapy: WFL for tasks assessed/performed Cognition: No apparent impairments             OT - Cognition Comments: still benefits from ongoing cues for compliance with back precautions during session                 Following commands: Intact        Cueing   Cueing Techniques: Verbal cues, Tactile cues, Visual cues  Exercises      Shoulder Instructions       General Comments incision appeared C/D/I    Pertinent Vitals/ Pain       Pain Assessment Pain Assessment: 0-10 Pain Score: 6  Pain Location: back Pain Descriptors / Indicators: Discomfort Pain Intervention(s): Limited activity within patient's tolerance, Monitored during session, Repositioned  Home Living                                          Prior Functioning/Environment              Frequency  Min 2X/week        Progress Toward Goals  OT Goals(current goals can now be found in the care plan section)  Progress towards OT goals: Progressing toward goals     Plan      Co-evaluation                 AM-PAC OT 6 Clicks Daily Activity     Outcome Measure   Help from another person eating meals?: None Help from another person taking care of personal grooming?: A Little Help from another person toileting, which includes using toliet, bedpan, or urinal?: A Little Help from another person bathing (including washing, rinsing, drying)?: A Lot Help from another person to put on and taking off regular upper body clothing?: A Little Help from  another person to put on and taking off regular lower body clothing?: A Little 6 Click Score: 18    End of Session Equipment Utilized During Treatment: Rolling walker (2 wheels);Back brace  OT Visit Diagnosis: Unsteadiness on feet (R26.81);Muscle weakness (generalized) (M62.81);Pain Pain - part of body:  (back)   Activity Tolerance Patient tolerated treatment well   Patient Left     Nurse Communication          Time: 8996-8976 OT Time Calculation (min): 20 min  Charges: OT General Charges $OT Visit: 1 Visit OT Treatments $Self Care/Home Management : 8-22 mins  Yolinda Duerr D., MSOT, OTR/L Acute Rehabilitation Services [phone] Secure Chat Preferred  Lynn Hogan 04/04/2024, 11:23 AM

## 2024-04-04 NOTE — Progress Notes (Signed)
 IP PROGRESS NOTE  Subjective:   Lynn Hogan continues to have back pain.  She is ambulating.  She reports having bowel movements.  Objective: Vital signs in last 24 hours: Blood pressure (!) 123/59, pulse (!) 56, temperature 98.3 F (36.8 C), temperature source Oral, resp. rate 14, height 5' 1 (1.549 m), weight 177 lb 14.6 oz (80.7 kg), SpO2 95%.  Intake/Output from previous day: No intake/output data recorded.  Physical Exam:  Abdomen: Soft and nontender, no hepatosplenomegaly Neurologic: The motor exam appears intact in the legs and feet bilaterally Skin: Thoracic spine incision intact Lymph nodes: No cervical, supraclavicular, or axillary nodes    Medications: I have reviewed the patient's current medications.  Assessment/Plan:  Melanoma 03/13/2022 left forearm malignant melanoma, nodular type, ulcerated, 2.2 x 1.4 x 0.8 cm, Breslow thickness 14 mm, Clark level V, stage IIc (pT4b pN0 ( 03/28/2022 needle biopsy left axillary lymph node-lymphoid tissue, no evidence of malignancy 04/24/2022 left forearm reexcision-no evidence of residual melanoma, 2 left axillary sentinel nodes negative for malignancy 11/23/2022 PET: Tracer avid lytic bone lesions at T9 and T10 03/21/2024 CT thoracic spine: Progression of lytic osseous metastatic disease at T9 with significant posterior extension into the spinal canal, mild progressive tumor at the superior endplate of T10 03/21/2024 MRI thoracic and lumbar spine: Metastatic disease at T9 with extension to T10, extraosseous extension into the spinal canal and paraspinal soft tissues, moderate to severe canal stenosis at T9, small T8 metastasis 03/22/2024 CTs: Left apical pleural-based nodules favored scarring, no evidence of a primary malignancy or soft tissue metastasis, T9 and T10 metastases 03/24/2024 MRI brain: No evidence of metastatic disease, 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  Ki-67-60%, germinal center phenotype   2.  Back pain secondary to a destructive mass at T9 3.  Hyperlipidemia 4.  History of coronary artery disease, NSTEMI 2020, STEMI 2020 5.  Hypertension 6.  History of CHF 7.  Basal cell carcinomas at the left ankle and right posterior arm excised 03/13/2022   Lynn Hogan is recovering from the thoracic decompressive laminectomy.  We received a preliminary report on 04/01/2024 that the pathology is consistent with lymphoma.  The final pathology returned this afternoon confirming large B-cell lymphoma. I discussed the preliminary diagnosis with Lynn Hogan this morning and I will review the final pathology report and treatment plan with her in the a.m. on 04/05/2024.  She had abnormal lesions at T9 and T10 on a PET in May 2024.  This is an unusual time sequence of progression for large B-cell lymphoma.   Recommendations: Postoperative care per Dr. Joshua Outpatient staging PET Check LDH  PT/OT-nursing facility stay Outpatient follow-up will be scheduled Cancer center   LOS: 13 days   Arley Hof, MD   04/04/2024, 4:32 PM

## 2024-04-04 NOTE — Progress Notes (Signed)
 Physical Therapy Treatment Patient Details Name: Lynn Hogan MRN: [id number] DOB: Apr 04, 1946 Today's Date: 04/04/2024   History of Present Illness Pt is a 78 y.o. female presenting 9/22 with progressive back pain. 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 S/p decompressive thoracic laminectomy/medial facetectomy, foraminotomy T8-11 with posterolateral arthridesis T7-12, allograft, segmental fixation T7-12. 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 Pt requests to use the bathroom and is able to perform pericare and hand hygiene without assist. Pt able to stand from low surfaces with CGA for safety, but requires increased effort and time to reach an upright posture. Pt able to tolerate gait trial with multiple standing rest breaks due to fatigue. Pt reports plan to stay with her daughter after rehab. Pt continues to benefit from PT services to progress toward functional mobility goals.     If plan is discharge home, recommend the following: A lot of help with bathing/dressing/bathroom;Help with stairs or ramp for entrance;Assist for transportation;A little help with walking and/or transfers   Can travel by private vehicle        Equipment Recommendations  None recommended by PT    Recommendations for Other Services       Precautions / Restrictions Precautions Precautions: Back;Fall Recall of Precautions/Restrictions: Intact Precaution/Restrictions Comments: pt able to recall all, light cues for adherence during mobility Required Braces or Orthoses: Spinal Brace Spinal Brace: Thoracolumbosacral orthotic;Applied in sitting position Restrictions Weight Bearing Restrictions Per Provider Order: No     Mobility  Bed Mobility Overal bed mobility: Needs Assistance Bed  Mobility: Supine to Sit, Sit to Supine     Supine to sit: HOB elevated, Used rails, Contact guard Sit to supine: Contact guard assist, HOB elevated, Used rails   General bed mobility comments: Pt comes to longsitting then to EOB    Transfers Overall transfer level: Needs assistance Equipment used: Rolling walker (2 wheels) Transfers: Sit to/from Stand Sit to Stand: Contact guard assist           General transfer comment: cues for hand placement due to pt tending to pull up on RW    Ambulation/Gait Ambulation/Gait assistance: Contact guard assist Gait Distance (Feet): 150 Feet Assistive device: Rolling walker (2 wheels) Gait Pattern/deviations: Step-through pattern, Decreased stride length Gait velocity: decreased     General Gait Details: Slow, guarded gait with low foot clearance despite cues. Cues for upright posture and shoulder depression. CGA for safety, but no overt LOB, multiple standing rest breaks due to UE fatigue   Stairs             Wheelchair Mobility     Tilt Bed    Modified Rankin (Stroke Patients Only)       Balance Overall balance assessment: Needs assistance Sitting-balance support: No upper extremity supported, Feet supported Sitting balance-Leahy Scale: Good Sitting balance - Comments: seated EOB   Standing balance support: Bilateral upper extremity supported, Reliant on assistive device for balance, During functional activity Standing balance-Leahy Scale: Fair Standing balance comment: RW support                            Communication Communication Communication: No apparent difficulties  Cognition Arousal: Alert Behavior During Therapy: WFL for tasks assessed/performed   PT - Cognitive impairments: No apparent  impairments                         Following commands: Intact      Cueing Cueing Techniques: Verbal cues, Tactile cues, Visual cues  Exercises      General Comments        Pertinent  Vitals/Pain Pain Assessment Pain Score: 6  Pain Location: back Pain Descriptors / Indicators: Discomfort Pain Intervention(s): Limited activity within patient's tolerance, Monitored during session, Repositioned     PT Goals (current goals can now be found in the care plan section) Acute Rehab PT Goals Patient Stated Goal: get better, go to rehab, and go home PT Goal Formulation: With patient Time For Goal Achievement: 04/09/24 Progress towards PT goals: Progressing toward goals    Frequency    Min 5X/week       AM-PAC PT 6 Clicks Mobility   Outcome Measure  Help needed turning from your back to your side while in a flat bed without using bedrails?: A Little Help needed moving from lying on your back to sitting on the side of a flat bed without using bedrails?: A Little Help needed moving to and from a bed to a chair (including a wheelchair)?: A Little Help needed standing up from a chair using your arms (e.g., wheelchair or bedside chair)?: A Little Help needed to walk in hospital room?: A Little Help needed climbing 3-5 steps with a railing? : A Lot 6 Click Score: 17    End of Session Equipment Utilized During Treatment: Gait belt;Back brace Activity Tolerance: Patient tolerated treatment well;Patient limited by fatigue Patient left: in bed;with call bell/phone within reach;with bed alarm set Nurse Communication: Mobility status PT Visit Diagnosis: Unsteadiness on feet (R26.81);Muscle weakness (generalized) (M62.81);Difficulty in walking, not elsewhere classified (R26.2)     Time: 8480-8454 PT Time Calculation (min) (ACUTE ONLY): 26 min  Charges:    $Gait Training: 8-22 mins $Therapeutic Activity: 8-22 mins PT General Charges $$ ACUTE PT VISIT: 1 Visit                     [name], PTA Acute Rehabilitation Services Secure Chat Preferred  [phone]    [name] 04/04/2024, 3:56 PM

## 2024-04-04 NOTE — Plan of Care (Signed)
   Problem: Education: Goal: Knowledge of General Education information will improve Description Including pain rating scale, medication(s)/side effects and non-pharmacologic comfort measures Outcome: Progressing   Problem: Health Behavior/Discharge Planning: Goal: Ability to manage health-related needs will improve Outcome: Progressing

## 2024-04-04 NOTE — Progress Notes (Signed)
 Inpatient Rehab Admissions Coordinator:   Notified that insurance denied CIR request after P2P.  I spoke to pt and her daughter and they prefer d/c to SNF at this time.  Notified Andrez Clifton, RN CM.  CIR will sign off.   Reche Lowers, PT, DPT Admissions Coordinator [phone] 04/04/24  8:36 AM

## 2024-04-05 DIAGNOSIS — G9589 Other specified diseases of spinal cord: Secondary | ICD-10-CM | POA: Diagnosis not present

## 2024-04-05 LAB — COMPREHENSIVE METABOLIC PANEL WITH GFR
ALT: 13 U/L (ref 0–44)
AST: 24 U/L (ref 15–41)
Albumin: 2.4 g/dL — ABNORMAL LOW (ref 3.5–5.0)
Alkaline Phosphatase: 66 U/L (ref 38–126)
Anion gap: 8 (ref 5–15)
BUN: 12 mg/dL (ref 8–23)
CO2: 24 mmol/L (ref 22–32)
Calcium: 8.4 mg/dL — ABNORMAL LOW (ref 8.9–10.3)
Chloride: 103 mmol/L (ref 98–111)
Creatinine, Ser: 0.62 mg/dL (ref 0.44–1.00)
GFR, Estimated: >60 mL/min (ref >60)
Glucose, Bld: 116 mg/dL — ABNORMAL HIGH (ref 70–99)
Potassium: 3.7 mmol/L (ref 3.5–5.1)
Sodium: 135 mmol/L (ref 135–145)
Total Bilirubin: 0.6 mg/dL (ref 0.0–1.2)
Total Protein: 5.5 g/dL — ABNORMAL LOW (ref 6.5–8.1)

## 2024-04-05 LAB — LACTATE DEHYDROGENASE: LDH: 185 U/L (ref 98–192)

## 2024-04-05 LAB — CBC WITH DIFFERENTIAL/PLATELET
Abs Immature Granulocytes: 0.14 K/uL — ABNORMAL HIGH (ref 0.00–0.07)
Basophils Absolute: 0 K/uL (ref 0.0–0.1)
Basophils Relative: 0 %
Eosinophils Absolute: 0.3 K/uL (ref 0.0–0.5)
Eosinophils Relative: 3 %
HCT: 33.8 % — ABNORMAL LOW (ref 36.0–46.0)
Hemoglobin: 10.8 g/dL — ABNORMAL LOW (ref 12.0–15.0)
Immature Granulocytes: 1 %
Lymphocytes Relative: 25 %
Lymphs Abs: 2.5 K/uL (ref 0.7–4.0)
MCH: 31.1 pg (ref 26.0–34.0)
MCHC: 32 g/dL (ref 30.0–36.0)
MCV: 97.4 fL (ref 80.0–100.0)
Monocytes Absolute: 0.9 K/uL (ref 0.1–1.0)
Monocytes Relative: 9 %
Neutro Abs: 6.1 K/uL (ref 1.7–7.7)
Neutrophils Relative %: 62 %
Platelets: 308 K/uL (ref 150–400)
RBC: 3.47 MIL/uL — ABNORMAL LOW (ref 3.87–5.11)
RDW: 14.3 % (ref 11.5–15.5)
WBC: 10 K/uL (ref 4.0–10.5)
nRBC: 0 % (ref 0.0–0.2)

## 2024-04-05 LAB — PHOSPHORUS: Phosphorus: 2.7 mg/dL (ref 2.5–4.6)

## 2024-04-05 LAB — MAGNESIUM: Magnesium: 1.9 mg/dL (ref 1.7–2.4)

## 2024-04-05 NOTE — TOC Progression Note (Addendum)
 Transition of Care [facility]) - Progression Note    Patient Details  Name: Lynn Hogan MRN: [id number] Date of Birth: September 21, 1945  Transition of Care [facility]) CM/SW Contact  Almarie CHRISTELLA Goodie, [place] Phone Number: 04/05/2024, 12:16 PM  Clinical Narrative:   CSW spoke with patient to provide bed offers. Patient to review with daughter, and will update CSW with choice. CSW to follow.  UPDATE: CSW received call back from patient's daughter, [name], that they reviewed options and she went to tour [employer]. She would like to accept bed offer at [facility]. CSW contacted [facility], they have a bed available. CSW requested CMA to initiate insurance authorization. CSW to follow.    Expected Discharge Plan: Skilled Nursing Facility Barriers to Discharge: Continued Medical Work up               Expected Discharge Plan and Services   Discharge Planning Services: CM Consult   Living arrangements for the past 2 months: Single Family Home                                       Social Drivers of Health (SDOH) Interventions SDOH Screenings   Food Insecurity: No Food Insecurity (03/22/2024)  Housing: Low Risk  (03/22/2024)  Transportation Needs: No Transportation Needs (03/22/2024)  Utilities: Not At Risk (03/22/2024)  Financial Resource Strain: Low Risk  (08/01/2018)  Physical Activity: Sufficiently Active (08/01/2018)  Social Connections: Socially Isolated (03/22/2024)  Stress: No Stress Concern Present (08/01/2018)  Tobacco Use: Low Risk  (03/25/2024)    Readmission Risk Interventions     No data to display

## 2024-04-05 NOTE — Progress Notes (Signed)
 PROGRESS NOTE    Lynn Hogan  FMW:[id number] DOB: 13-Mar-1946 DOA: 03/21/2024 PCP: Joshua Franky Sharper, PA-C  Chief Complaint  Patient presents with   Back Pain    Brief Narrative:    78 y.o. 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  MRI showed metastatic disease at T9 and T10 as well as T8.  Seen by neurosurgery and oncology.   Now s/p decompressive thoracic laminectomy and medial facetectomy and foraminotomy at T8-9, T9-10, T10-11 with transpedicular decompression T9 on the R with removal of epidural tumor.  Biopsy showing large b cell lymphoma.  Oncology following.  Plan for snf when able.  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  Seen by oncology, appreciate continued assistance - Dr. Deanne to follow Monday Now s/p decompressive thoracic laminectomy and medial facetectomy and foraminotomy at T8-9, T9-10, T10-11 with transpedicular decompression T9 on the R with removal of epidural tumor.  Posterolateral arthrodesis T7-T12 ultizing locally harvested morselized autologous bone graft and morselized allograft.  Segmental fixation T7-T12 utilizing alphatec pedicle  screws.   Path with large B cell lymphoma - per oncology Decadron  d/c'd 10/4   History of Melanoma Malignant melanoma of L forearm 03/13/2022    Hypertension Metoprolol   Sinus Bradycardia Consider holding or discontinuing metop, will monitor and reeval   Dyslipidemia Lipitor , zetia    CAD Stable statin  Prediabetes A1c 5.7   Constipation:  Bowel regimen      DVT prophylaxis: SCD Code Status: full Family Communication: none Disposition:   Status is: Inpatient Remains inpatient appropriate because: continued inpatient care - safe placement   Consultants:  Neurosurgery oncology  Procedures:  9/26 1.  Decompressive thoracic laminectomy and medial facetectomy and foraminotomy T8-9 T9-10 T10-11 with transpedicular decompression T9 on the right with removal of epidural tumor, 2.  Posterolateral arthrodesis T7-T12 utilizing locally harvested morselized autologous bone graft and morselized allograft, 3.  Segmental fixation T7-T12 utilizing Alphatec pedicle screws   Antimicrobials:  Anti-infectives (From admission, onward)    Start     Dose/Rate Route Frequency Ordered Stop   03/25/24 2300  ceFAZolin  (ANCEF ) IVPB 2g/100 mL premix        2 g 200 mL/hr over 30 Minutes Intravenous Every 8 hours 03/25/24 2033 03/26/24 0741       Subjective: No complaints today  Objective: Vitals:   04/04/24 2355 04/05/24 0350 04/05/24 0846 04/05/24 1109  BP: (!) 130/52 (!) 151/58 (!) 151/66 (!) 122/54  Pulse: (!) 58 (!) 59 (!) 59 (!) 55  Resp: 16 18 19 16   Temp: 98.4 F (36.9 C) 98.2 F (36.8 C) (!) 97.5 F (36.4 C) 98.1 F (36.7 C)  TempSrc: Oral Oral Oral Oral  SpO2: 93% 96% 94% 94%  Weight:  Height:       No intake or output data in the 24 hours ending 04/05/24 1343 Filed Weights   03/21/24 1158 03/25/24 1242  Weight: 80.7 kg 80.7 kg    Examination:  General: No acute distress. Lungs: unlabored Neurological: Alert and oriented 3. Moves all extremities 4  with equal strength. Cranial nerves II through XII grossly intact. Extremities: No clubbing or cyanosis. No edema.  Data Reviewed: I have personally reviewed following labs and imaging studies  CBC: Recent Labs  Lab 03/30/24 0837 04/05/24 0512  WBC 10.2 10.0  NEUTROABS 6.2 6.1  HGB 11.8* 10.8*  HCT 36.1 33.8*  MCV 94.8 97.4  PLT 363 308    Basic Metabolic Panel: Recent Labs  Lab 03/30/24 0837 04/05/24 0512  NA 136 135  K 3.9 3.7  CL 101 103  CO2 27 24  GLUCOSE 99 116*  BUN 13 12  CREATININE 0.62 0.62  CALCIUM  8.7* 8.4*  MG  --  1.9  PHOS  --  2.7    GFR: Estimated Creatinine Clearance: 54.9 mL/min (by C-G formula based on SCr of 0.62 mg/dL).  Liver Function Tests: Recent Labs  Lab 04/05/24 0512  AST 24  ALT 13  ALKPHOS 66  BILITOT 0.6  PROT 5.5*  ALBUMIN 2.4*    CBG: No results for input(s): GLUCAP in the last 168 hours.   No results found for this or any previous visit (from the past 240 hours).       Radiology Studies: No results found.      Scheduled Meds:  atorvastatin   40 mg Oral QPM   docusate sodium   100 mg Oral BID   ezetimibe   10 mg Oral Daily   lidocaine   1 Application Topical Once   naloxegol oxalate  12.5 mg Oral Daily   polyethylene glycol  17 g Oral Daily   senna  1 tablet Oral BID   senna-docusate  2 tablet Oral QHS   sodium chloride  flush  3 mL Intravenous Q12H   Continuous Infusions:   LOS: 14 days    Time spent: over 30 min     Meliton Monte, MD [facility]   To contact the attending provider between 7A-7P or the covering provider during after hours 7P-7A, please log into the web site [email or web address] and access using universal [place] password for that web site. If you do not have the password, please call the hospital operator.  04/05/2024, 1:43 PM

## 2024-04-05 NOTE — Plan of Care (Signed)
   Problem: Education: Goal: Knowledge of General Education information will improve Description Including pain rating scale, medication(s)/side effects and non-pharmacologic comfort measures Outcome: Progressing   Problem: Health Behavior/Discharge Planning: Goal: Ability to manage health-related needs will improve Outcome: Progressing

## 2024-04-05 NOTE — Progress Notes (Signed)
 IP PROGRESS NOTE  Subjective:   Ms. Bady continues to have back pain.  She reports urinary hesitancy.  She is ambulating.  Objective: Vital signs in last 24 hours: Blood pressure (!) 122/54, pulse (!) 55, temperature 98.1 F (36.7 C), temperature source Oral, resp. rate 16, height 5' 1 (1.549 m), weight 177 lb 14.6 oz (80.7 kg), SpO2 94%.   Physical Exam:  Neurologic: The motor exam appears intact in the legs and feet bilaterally Skin: Thoracic spine incision intact     Medications: I have reviewed the patient's current medications.  Assessment/Plan:  Melanoma 03/13/2022 left forearm malignant melanoma, nodular type, ulcerated, 2.2 x 1.4 x 0.8 cm, Breslow thickness 14 mm, Clark level V, stage IIc (pT4b pN0 ( 03/28/2022 needle biopsy left axillary lymph node-lymphoid tissue, no evidence of malignancy 04/24/2022 left forearm reexcision-no evidence of residual melanoma, 2 left axillary sentinel nodes negative for malignancy 11/23/2022 PET: Tracer avid lytic bone lesions at T9 and T10 03/21/2024 CT thoracic spine: Progression of lytic osseous metastatic disease at T9 with significant posterior extension into the spinal canal, mild progressive tumor at the superior endplate of T10 03/21/2024 MRI thoracic and lumbar spine: Metastatic disease at T9 with extension to T10, extraosseous extension into the spinal canal and paraspinal soft tissues, moderate to severe canal stenosis at T9, small T8 metastasis 03/22/2024 CTs: Left apical pleural-based nodules favored scarring, no evidence of a primary malignancy or soft tissue metastasis, T9 and T10 metastases 03/24/2024 MRI brain: No evidence of metastatic disease, 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  Ki-67-60%, germinal center phenotype   2.  Back pain secondary to a destructive mass at T9 3.  Hyperlipidemia 4.  History  of coronary artery disease, NSTEMI 2020, STEMI 2020 5.  Hypertension 6.  History of CHF 7.  Basal cell carcinomas at the left ankle and right posterior arm excised 03/13/2022   Ms. Hoston continues to recover from the thoracic decompressive laminectomy.  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  A molecular FISH panel is pending.  The LDH is normal. I discussed the diagnosis of large B-cell lymphoma with Ms. Franchot.  She understands the plan to obtain a staging PET scan prior to deciding on treatment.  We discussed the probable plan to proceed with systemic chemotherapy (R-CHOP).  She may be a candidate for a limited number of chemotherapy cycles followed by radiation if the PET reveals no other disease. The PET scan and outpatient follow-up will be scheduled when a disposition plan is in place. Recommendations: Outpatient staging PET Continue PT/OT, skilled nursing facility stay Outpatient follow-up will be scheduled at the Cancer center   LOS: 14 days   Arley Hof, MD   04/05/2024, 1:20 PM

## 2024-04-05 NOTE — Progress Notes (Signed)
 Patient ID: Lynn Hogan, female   DOB: 12/05/1945, 78 y.o.   MRN: [id number] She looks good and is in good spirits, path back and shows B cell lymphoma. Onc now has a plan and it sounds like they plan chemo to start in 3 wks. Wound is CDI, legs have good strength, TLSO at bedside  I will sign off but call with any questions, I will see her in the office 2 wks after d/c

## 2024-04-06 DIAGNOSIS — G9589 Other specified diseases of spinal cord: Secondary | ICD-10-CM | POA: Diagnosis not present

## 2024-04-06 DIAGNOSIS — C8510 Unspecified B-cell lymphoma, unspecified site: Secondary | ICD-10-CM

## 2024-04-06 NOTE — Progress Notes (Signed)
 PT Cancellation Note  Patient Details Name: Lynn Hogan MRN: [id number] DOB: 1946/01/01   Cancelled Treatment:    Reason Eval/Treat Not Completed: (P) Patient declined, no reason specified (Pt reports recently receiving medication and feeling loopy. Pt requests that PTA return later. Will follow up as schedule allows.)   [name] 04/06/2024, 12:53 PM

## 2024-04-06 NOTE — TOC Progression Note (Signed)
 Transition of Care [facility]) - Progression Note    Patient Details  Name: Lynn Hogan MRN: [id number] Date of Birth: [birth year]  [facility] CM/SW Contact  Almarie CHRISTELLA Goodie, [place] Phone Number: 04/06/2024, 4:08 PM  Clinical Narrative:   CSW following for disposition. Insurance authorization remains pending for SNF at this time. CSW to follow.    Expected Discharge Plan: Skilled Nursing Facility Barriers to Discharge: English as a second language teacher, Continued Medical Work up               Expected Discharge Plan and Services   Discharge Planning Services: CM Consult   Living arrangements for the past 2 months: Single Family Home                                       Social Drivers of Health (SDOH) Interventions SDOH Screenings   Food Insecurity: No Food Insecurity (03/22/2024)  Housing: Low Risk  (03/22/2024)  Transportation Needs: No Transportation Needs (03/22/2024)  Utilities: Not At Risk (03/22/2024)  Financial Resource Strain: Low Risk  (08/01/2018)  Physical Activity: Sufficiently Active (08/01/2018)  Social Connections: Socially Isolated (03/22/2024)  Stress: No Stress Concern Present (08/01/2018)  Tobacco Use: Low Risk  (03/25/2024)    Readmission Risk Interventions     No data to display

## 2024-04-06 NOTE — Hospital Course (Addendum)
 78 y.o. 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  MRI showed metastatic disease at T9 and T10 as well as T8.  Seen by neurosurgery and oncology.    Now s/p decompressive thoracic laminectomy and medial facetectomy and foraminotomy at T8-9, T9-10, T10-11 with transpedicular decompression T9 on the R with removal of epidural tumor.  Biopsy showing large b cell lymphoma.   Oncology following.  Outpatient follow-up planned.   Assessment & Plan:   Back pain due to metastatic cancer Osseus metastatic disease at T9 with extension inferiorly to involve T10  T8 Metastasis  Moderate to Severe Stenosis at T9 - CT chest abdomen pelvis without evidence ofprimary malignancy or soft tissue metastasis within the chest, abdomen, or pelvis - MRI brain did not show any metastatic disease.  - Seen by oncology, appreciate continued assistance - Dr. Deanne to follow Monday - Now s/p decompressive thoracic laminectomy and medial facetectomy and foraminotomy at T8-9, T9-10, T10-11 with transpedicular decompression T9 on the R with removal of epidural tumor.  - Posterolateral arthrodesis T7-T12 ultizing locally harvested morselized autologous bone graft and morselized allograft.  Segmental fixation T7-T12 utilizing alphatec pedicle screws.   - Path with large B cell lymphoma - per oncology - Decadron  d/c'd 10/4 - Outpatient follow-up with oncology planned for PET scan and beginning treatment   History of Melanoma - Malignant melanoma of L forearm 03/13/2022    Hypertension - Metoprolol    Sinus Bradycardia - Toprol  resumed at lower dose   Dyslipidemia - Lipitor , zetia    CAD - statin   Prediabetes - A1c 5.7   Constipation - Bowel regimen

## 2024-04-06 NOTE — Progress Notes (Signed)
 Progress Note    Lynn Hogan   FMW:[id number]  DOB: 1946/01/04  DOA: 03/21/2024     15 PCP: Joshua Franky Sharper, PA-C  Initial CC: Back pain  Hospital Course: 78 y.o. female with medical history significant of malignant melanoma of the left forearm status post chemotherapy and surgery years ago, coronary artery disease, hyperlipidemia, essential hypertension, ischemic cardiomyopathy, degenerative disc disease who presented to the ER at [facility] with back pain.  MRI showed metastatic disease at T9 and T10 as well as T8.  Seen by neurosurgery and oncology.    Now s/p decompressive thoracic laminectomy and medial facetectomy and foraminotomy at T8-9, T9-10, T10-11 with transpedicular decompression T9 on the R with removal of epidural tumor.  Biopsy showing large b cell lymphoma.   Oncology following.   Plan for snf when able.   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  Seen by oncology, appreciate continued assistance - Dr. Deanne to follow Monday Now s/p decompressive thoracic laminectomy and medial facetectomy and foraminotomy at T8-9, T9-10, T10-11 with transpedicular decompression T9 on the R with removal of epidural tumor.  Posterolateral arthrodesis T7-T12 ultizing locally harvested morselized autologous bone graft and morselized allograft.  Segmental fixation T7-T12 utilizing alphatec pedicle screws.   Path with large B cell lymphoma - per oncology Decadron  d/c'd 10/4 - Outpatient follow-up with oncology planned for PET scan and beginning treatment   History of Melanoma Malignant melanoma of L forearm 03/13/2022    Hypertension Metoprolol    Sinus  Bradycardia -Metoprolol  on hold   Dyslipidemia Lipitor , zetia    CAD Stable statin   Prediabetes A1c 5.7   Constipation Bowel regimen   Interval History:  No events overnight.  Still having ongoing back pain but slowly improving.  Awaiting insurance approval for SNF placement.  Old records reviewed in assessment of this patient  Antimicrobials:   DVT prophylaxis:  SCD's Start: 03/25/24 2034   Code Status:   Code Status: Full Code  Mobility Assessment (Last 72 Hours)     Mobility Assessment     Row Name 04/06/24 0800 04/05/24 2000 04/04/24 2035 04/04/24 1554 04/04/24 1110   Does the patient have exclusion criteria? No - Perform mobility assessment No - Perform mobility assessment No - Perform mobility assessment -- --   Mobility Assessment Exclusion Criteria No exclusion criteria present, perform mobility assessment No exclusion criteria present, perform mobility assessment -- -- --   What is the highest level of mobility based on the mobility assessment? Level 4 (Ambulates with assistance) - Balance while stepping forward/back - Complete Level 4 (Ambulates with assistance) - Balance while stepping forward/back - Complete Level 4 (Ambulates with assistance) - Balance while stepping forward/back - Complete Level 4 (Ambulates with assistance) - Balance while stepping forward/back - Complete Level 4 (Ambulates with assistance) - Balance while stepping forward/back - Complete   Is the above level different from baseline mobility prior to current illness? Yes - Recommend PT order Yes - Recommend PT order Yes - Recommend PT order -- --    Row Name 04/04/24 0915 04/03/24 2012         Does the patient have exclusion criteria? No - Perform mobility assessment No -  Perform mobility assessment      Mobility Assessment Exclusion Criteria -- No exclusion criteria present, perform mobility assessment      What is the highest level of mobility based on the mobility assessment? Level 4  (Ambulates with assistance) - Balance while stepping forward/back - Complete Level 4 (Ambulates with assistance) - Balance while stepping forward/back - Complete      Is the above level different from baseline mobility prior to current illness? Yes - Recommend PT order Yes - Recommend PT order         Barriers to discharge: None Disposition Plan: SNF HH orders placed: N/A Status is: Inpatient  Objective: Blood pressure (!) 127/56, pulse 73, temperature 98.8 F (37.1 C), temperature source Oral, resp. rate 19, height 5' 1 (1.549 m), weight 80.7 kg, SpO2 92%.  Examination:  Physical Exam Constitutional:      Appearance: Normal appearance.  HENT:     Head: Normocephalic and atraumatic.     Mouth/Throat:     Mouth: Mucous membranes are moist.  Eyes:     Extraocular Movements: Extraocular movements intact.  Cardiovascular:     Rate and Rhythm: Normal rate and regular rhythm.  Pulmonary:     Effort: Pulmonary effort is normal. No respiratory distress.     Breath sounds: Normal breath sounds. No wheezing.  Abdominal:     General: Bowel sounds are normal. There is no distension.     Palpations: Abdomen is soft.     Tenderness: There is no abdominal tenderness.  Musculoskeletal:     Cervical back: Normal range of motion and neck supple.     Comments: Incision noted along thoracic spine healing well.  Appropriately tender to palpation  Skin:    General: Skin is warm and dry.  Neurological:     General: No focal deficit present.     Mental Status: She is alert.  Psychiatric:        Mood and Affect: Mood normal.      Consultants:  Neurosurgery Oncology  Procedures:    Data Reviewed: No results found for this or any previous visit (from the past 24 hours).  I have reviewed pertinent nursing notes, vitals, labs, and images as necessary. I have ordered labwork to follow up on as indicated.  I have reviewed the last notes from staff over past 24 hours. I have discussed  patient's care plan and test results with nursing staff, CM/SW, and other staff as appropriate.  Time spent: Greater than 50% of the 55 minute visit was spent in counseling/coordination of care for the patient as laid out in the A&P.   LOS: 15 days   Alm Apo, MD [facility] 04/06/2024, 3:27 PM

## 2024-04-06 NOTE — Plan of Care (Signed)
  Problem: Education: Goal: Knowledge of General Education information will improve Description: Including pain rating scale, medication(s)/side effects and non-pharmacologic comfort measures Outcome: Progressing   Problem: Health Behavior/Discharge Planning: Goal: Ability to manage health-related needs will improve Outcome: Progressing   Problem: Health Behavior/Discharge Planning: Goal: Ability to manage health-related needs will improve Outcome: Progressing   

## 2024-04-07 DIAGNOSIS — G9589 Other specified diseases of spinal cord: Secondary | ICD-10-CM | POA: Diagnosis not present

## 2024-04-07 DIAGNOSIS — C8510 Unspecified B-cell lymphoma, unspecified site: Secondary | ICD-10-CM | POA: Diagnosis not present

## 2024-04-07 NOTE — Progress Notes (Signed)
 Physical Therapy Treatment Patient Details Name: Lynn Hogan MRN: [id number] DOB: 01/08/1946 Today's Date: 04/07/2024   History of Present Illness Pt is a 78 y.o. female presenting 9/22 with progressive back pain. 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 S/p decompressive thoracic laminectomy/medial facetectomy, foraminotomy T8-11 with posterolateral arthridesis T7-12, allograft, segmental fixation T7-12. PMH: melanoma if L forearm, CAD, HLD, essential HTN, ischemic cardiomyopathy, DDD, NSTEMI   PT Comments  Pt in recliner upon arrival and eager for mobility. Pt required CGA for all mobility with use of RW or rollator. Pt preferred use of rollator with frequent cueing for safety with using brakes and with hand placement. Able to ambulate short distances before needing a seated rest break due to fatigue. Continue to recommend <3hrs post acute rehab with acute PT to follow.     If plan is discharge home, recommend the following: A lot of help with bathing/dressing/bathroom;Help with stairs or ramp for entrance;Assist for transportation;A little help with walking and/or transfers   Can travel by private vehicle     Yes  Equipment Recommendations  None recommended by PT       Precautions / Restrictions Precautions Precautions: Back;Fall Precaution Booklet Issued: Yes (comment) Recall of Precautions/Restrictions: Intact Required Braces or Orthoses: Spinal Brace Spinal Brace: Thoracolumbosacral orthotic;Applied in sitting position Restrictions Weight Bearing Restrictions Per Provider Order: No     Mobility  Bed Mobility Overal bed mobility: Needs Assistance Bed Mobility: Sit to Supine      Sit to supine: Contact guard assist, HOB elevated, Used rails   General bed mobility comments: pivots on hips to move into long-sitting, able to perform while adhering to spinal  precautions    Transfers Overall transfer level: Needs assistance Equipment used: Rolling walker (2 wheels), Rollator (4 wheels) Transfers: Sit to/from Stand, Bed to chair/wheelchair/BSC Sit to Stand: Contact guard assist      General transfer comment: cues for hand placement as pt likes to pull on RW handles, cues to lock/unlock rollator brakes    Ambulation/Gait Ambulation/Gait assistance: Contact guard assist Gait Distance (Feet): 80 Feet (x80, x80, x40) Assistive device: Rollator (4 wheels), Rolling walker (2 wheels) Gait Pattern/deviations: Step-through pattern, Decreased stride length Gait velocity: decreased     General Gait Details: slow and guarded steps with decreased gait speed with increased distance. Frequent seated rest breaks due to fatigue and feeling lightheaded       Balance Overall balance assessment: Needs assistance Sitting-balance support: No upper extremity supported, Feet supported Sitting balance-Leahy Scale: Good     Standing balance support: Bilateral upper extremity supported, Reliant on assistive device for balance, During functional activity Standing balance-Leahy Scale: Fair Standing balance comment: RW support         Communication Communication Communication: No apparent difficulties  Cognition Arousal: Alert Behavior During Therapy: WFL for tasks assessed/performed   PT - Cognitive impairments: No apparent impairments    Following commands: Intact      Cueing Cueing Techniques: Verbal cues, Tactile cues, Visual cues  Exercises General Exercises - Lower Extremity Long Arc Quad: AROM, Both, 5 reps, Seated Hip Flexion/Marching: AROM, Both, 5 reps, Seated Other Exercises Other Exercises: Bx10 toe taps with B UE support        Pertinent Vitals/Pain Pain Assessment Pain Assessment: Faces Faces Pain Scale: Hurts little more Pain Location: back Pain Descriptors / Indicators: Discomfort Pain Intervention(s): Limited activity  within patient's tolerance, Monitored during session,  Repositioned     PT Goals (current goals can now be found in the care plan section) Acute Rehab PT Goals PT Goal Formulation: With patient Time For Goal Achievement: 04/09/24 Potential to Achieve Goals: Good Progress towards PT goals: Progressing toward goals    Frequency    Min 2X/week       AM-PAC PT 6 Clicks Mobility   Outcome Measure  Help needed turning from your back to your side while in a flat bed without using bedrails?: A Little Help needed moving from lying on your back to sitting on the side of a flat bed without using bedrails?: A Little Help needed moving to and from a bed to a chair (including a wheelchair)?: A Little Help needed standing up from a chair using your arms (e.g., wheelchair or bedside chair)?: A Little Help needed to walk in hospital room?: A Little Help needed climbing 3-5 steps with a railing? : A Lot 6 Click Score: 17    End of Session Equipment Utilized During Treatment: Gait belt;Back brace Activity Tolerance: Patient tolerated treatment well;Patient limited by fatigue Patient left: in bed;with call bell/phone within reach;with bed alarm set Nurse Communication: Mobility status PT Visit Diagnosis: Unsteadiness on feet (R26.81);Muscle weakness (generalized) (M62.81);Difficulty in walking, not elsewhere classified (R26.2)     Time: 9095-9070 PT Time Calculation (min) (ACUTE ONLY): 25 min  Charges:    $Gait Training: 8-22 mins $Therapeutic Exercise: 8-22 mins PT General Charges $$ ACUTE PT VISIT: 1 Visit                    Kate ORN, PT, DPT Secure Chat Preferred  Rehab Office [phone]   Kate BRAVO [name] 04/07/2024, 10:29 AM

## 2024-04-07 NOTE — Progress Notes (Signed)
 Occupational Therapy Treatment Patient Details Name: Lynn Hogan MRN: [id number] DOB: 1946-04-20 Today's Date: 04/07/2024   History of present illness Pt is a 78 y.o. female presenting 9/22 with progressive back pain. 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 S/p decompressive thoracic laminectomy/medial facetectomy, foraminotomy T8-11 with posterolateral arthridesis T7-12, allograft, segmental fixation T7-12. PMH: melanoma if L forearm, CAD, HLD, essential HTN, ischemic cardiomyopathy, DDD, NSTEMI   OT comments  Pt received in supine, initially reluctant to participate because she was frustrated with current situation; agreeable to participate with encouragement. She progressed well today, only needing intermittent CGA for mobility and transfers with rollator. Performed TLSO mgmt with setup/cueing and standing grooming tasks with supervision. AMPAC 21/24 indicating improving functional performance. Updating d/c recommendation to home with HHOT. Pt anticipates stayin at her daughter's house initially upon d/c. OT to continue to follow.      If plan is discharge home, recommend the following:  A little help with bathing/dressing/bathroom;Assistance with cooking/housework;Assist for transportation   Equipment Recommendations  None recommended by OT    Recommendations for Other Services      Precautions / Restrictions Precautions Precautions: Back;Fall Required Braces or Orthoses: Spinal Brace Spinal Brace: Thoracolumbosacral orthotic;Applied in sitting position Restrictions Weight Bearing Restrictions Per Provider Order: No       Mobility Bed Mobility Overal bed mobility: Needs Assistance Bed Mobility: Supine to Sit, Sit to Supine     Supine to sit: Supervision, Contact guard, HOB elevated Sit to supine: Supervision, HOB elevated   General bed mobility comments: Lowered  HOB to simulate daughter's bed (with wedge pillows), no use of bed rails, inc effort for sequencing through log roll    Transfers Overall transfer level: Needs assistance Equipment used: Rollator (4 wheels) Transfers: Sit to/from Stand Sit to Stand: Supervision           General transfer comment: Cued for hand placement and to lock brakes on rollator prior to standing.     Balance Overall balance assessment: Needs assistance Sitting-balance support: No upper extremity supported, Feet supported Sitting balance-Leahy Scale: Good Sitting balance - Comments: seated EOB   Standing balance support: Bilateral upper extremity supported, Reliant on assistive device for balance, During functional activity Standing balance-Leahy Scale: Fair Standing balance comment: RW and sink vanity support, improving ability to  maintain balance with BUE removed from sturdy surfaces during functional tasks.                           ADL either performed or assessed with clinical judgement   ADL Overall ADL's : Needs assistance/impaired     Grooming: Wash/dry hands;Standing;Supervision/safety           Upper Body Dressing : Set up;Supervision/safety;Sitting;Cueing for compensatory techniques Upper Body Dressing Details (indicate cue type and reason): donning/doffing TLSO brace at EOB     Toilet Transfer: Supervision/safety;Contact guard assist;Ambulation;Regular Toilet;Rollator (4 wheels) Toilet Transfer Details (indicate cue type and reason): wBOS for transfer, used grab bars to assist with good form/technique                Extremity/Trunk Assessment              Vision       Perception     Praxis     Communication     Cognition Arousal: Alert Behavior During Therapy: WFL for tasks assessed/performed Cognition: No apparent impairments  Following commands: Intact        Cueing   Cueing Techniques: Verbal cues,  Tactile cues, Visual cues  Exercises      Shoulder Instructions       General Comments incision hidden beneath post-op dressing    Pertinent Vitals/ Pain       Pain Assessment Pain Assessment: Faces Faces Pain Scale: Hurts a little bit Pain Location: back Pain Descriptors / Indicators: Discomfort Pain Intervention(s): Limited activity within patient's tolerance, Monitored during session, Repositioned  Home Living                                          Prior Functioning/Environment              Frequency  Min 2X/week        Progress Toward Goals  OT Goals(current goals can now be found in the care plan section)  Progress towards OT goals: Progressing toward goals     Plan      Co-evaluation                 AM-PAC OT 6 Clicks Daily Activity     Outcome Measure   Help from another person eating meals?: None Help from another person taking care of personal grooming?: None Help from another person toileting, which includes using toliet, bedpan, or urinal?: None Help from another person bathing (including washing, rinsing, drying)?: A Lot Help from another person to put on and taking off regular upper body clothing?: None Help from another person to put on and taking off regular lower body clothing?: A Little 6 Click Score: 21    End of Session Equipment Utilized During Treatment: Rollator (4 wheels);Back brace  OT Visit Diagnosis: Unsteadiness on feet (R26.81);Muscle weakness (generalized) (M62.81);Pain   Activity Tolerance Patient tolerated treatment well   Patient Left in bed;with call bell/phone within reach;with bed alarm set   Nurse Communication Other (comment) (pt needing anxiety medications, details of OT session)        Time: 8852-8783 OT Time Calculation (min): 29 min  Charges: OT General Charges $OT Visit: 1 Visit OT Treatments $Self Care/Home Management : 23-37 mins  Alberto Pina D., MSOT, OTR/L Acute  Rehabilitation Services [phone] Secure Chat Preferred  Lynn Hogan 04/07/2024, 2:12 PM

## 2024-04-07 NOTE — Plan of Care (Signed)
   Problem: Education: Goal: Knowledge of General Education information will improve Description Including pain rating scale, medication(s)/side effects and non-pharmacologic comfort measures Outcome: Progressing

## 2024-04-07 NOTE — TOC Progression Note (Signed)
 Transition of Care [facility]) - Progression Note    Patient Details  Name: Lynn Hogan MRN: [id number] Date of Birth: 01/31/1946  [facility] CM/SW Contact  Almarie CHRISTELLA [place]ie, [place] Phone Number: 04/07/2024, 11:28 AM  Clinical Narrative:   CSW updated that patient's insurance denied request for SNF after peer to peer. CSW spoke with patient to update and provide information, patient would like to appeal. Information provided on submitting fast appeal. CSW updated [place] to follow.    Expected Discharge Plan: Skilled Nursing Facility Barriers to Discharge: Insurance Authorization               Expected Discharge Plan and Services   Discharge Planning Services: CM Consult   Living arrangements for the past 2 months: Single Family Home                                       Social Drivers of Health (SDOH) Interventions SDOH Screenings   Food Insecurity: No Food Insecurity (03/22/2024)  Housing: Low Risk  (03/22/2024)  Transportation Needs: No Transportation Needs (03/22/2024)  Utilities: Not At Risk (03/22/2024)  Financial Resource Strain: Low Risk  (08/01/2018)  Physical Activity: Sufficiently Active (08/01/2018)  Social Connections: Socially Isolated (03/22/2024)  Stress: No Stress Concern Present (08/01/2018)  Tobacco Use: Low Risk  (03/25/2024)    Readmission Risk Interventions     No data to display

## 2024-04-07 NOTE — Progress Notes (Signed)
 Progress Note    Lynn Hogan   FMW:[id number]  DOB: 10-10-1945  DOA: 03/21/2024     16 PCP: Joshua Franky Sharper, PA-C  Initial CC: Back pain  Hospital Course: 78 y.o. female with medical history significant of malignant melanoma of the left forearm status post chemotherapy and surgery years ago, coronary artery disease, hyperlipidemia, essential hypertension, ischemic cardiomyopathy, degenerative disc disease who presented to the ER at [facility] with back pain.  MRI showed metastatic disease at T9 and T10 as well as T8.  Seen by neurosurgery and oncology.    Now s/p decompressive thoracic laminectomy and medial facetectomy and foraminotomy at T8-9, T9-10, T10-11 with transpedicular decompression T9 on the R with removal of epidural tumor.  Biopsy showing large b cell lymphoma.   Oncology following.   Plan for snf when able.   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  Seen by oncology, appreciate continued assistance - Dr. Deanne to follow Monday Now s/p decompressive thoracic laminectomy and medial facetectomy and foraminotomy at T8-9, T9-10, T10-11 with transpedicular decompression T9 on the R with removal of epidural tumor.  Posterolateral arthrodesis T7-T12 ultizing locally harvested morselized autologous bone graft and morselized allograft.  Segmental fixation T7-T12 utilizing alphatec pedicle screws.   Path with large B cell lymphoma - per oncology Decadron  d/c'd 10/4 - Outpatient follow-up with oncology planned for PET scan and beginning treatment   History of Melanoma Malignant melanoma of L forearm 03/13/2022    Hypertension Metoprolol    Sinus  Bradycardia -Metoprolol  on hold   Dyslipidemia Lipitor , zetia    CAD Stable statin   Prediabetes A1c 5.7   Constipation Bowel regimen   Interval History:  No events overnight.  Declined from insurance for rehab.  Peer to peer denied also. Backup plan is her going home to family member's house.  They are in the process of getting the house situated and may not be ready until late tomorrow or Saturday.  Old records reviewed in assessment of this patient  Antimicrobials:   DVT prophylaxis:  SCD's Start: 03/25/24 2034   Code Status:   Code Status: Full Code  Mobility Assessment (Last 72 Hours)     Mobility Assessment     Row Name 04/07/24 1147 04/07/24 0921 04/07/24 0745 04/06/24 1957 04/06/24 1600   Does the patient have exclusion criteria? -- -- No - Perform mobility assessment No - Perform mobility assessment No - Perform mobility assessment   Mobility Assessment Exclusion Criteria -- -- -- No exclusion criteria present, perform mobility assessment --   What is the highest level of mobility based on the mobility assessment? Level 4 (Ambulates with assistance) - Balance while stepping forward/back - Complete Level 4 (Ambulates with assistance) - Balance while stepping forward/back - Complete Level 4 (Ambulates with assistance) - Balance while stepping forward/back - Complete Level 4 (Ambulates with assistance) - Balance while stepping forward/back - Complete Level 4 (Ambulates with assistance) - Balance while stepping forward/back - Complete   Is the above level different from baseline mobility prior to current illness? -- -- Yes - Recommend PT order Yes - Recommend PT order Yes - Recommend PT order    Row Name 04/06/24 1155 04/06/24 0800  04/05/24 2000 04/04/24 2035 04/04/24 1554   Does the patient have exclusion criteria? No - Perform mobility assessment No - Perform mobility assessment No - Perform mobility assessment No - Perform mobility assessment --   Mobility Assessment  Exclusion Criteria -- No exclusion criteria present, perform mobility assessment No exclusion criteria present, perform mobility assessment -- --   What is the highest level of mobility based on the mobility assessment? Level 4 (Ambulates with assistance) - Balance while stepping forward/back - Complete Level 4 (Ambulates with assistance) - Balance while stepping forward/back - Complete Level 4 (Ambulates with assistance) - Balance while stepping forward/back - Complete Level 4 (Ambulates with assistance) - Balance while stepping forward/back - Complete Level 4 (Ambulates with assistance) - Balance while stepping forward/back - Complete   Is the above level different from baseline mobility prior to current illness? Yes - Recommend PT order Yes - Recommend PT order Yes - Recommend PT order Yes - Recommend PT order --      Barriers to discharge: None Disposition Plan: Home HH orders placed: N/A Status is: Inpatient  Objective: Blood pressure (!) 108/58, pulse (!) 54, temperature (!) 97.5 F (36.4 C), temperature source Oral, resp. rate 19, height 5' 1 (1.549 m), weight 80.7 kg, SpO2 98%.  Examination:  Physical Exam Constitutional:      Appearance: Normal appearance.  HENT:     Head: Normocephalic and atraumatic.     Mouth/Throat:     Mouth: Mucous membranes are moist.  Eyes:     Extraocular Movements: Extraocular movements intact.  Cardiovascular:     Rate and Rhythm: Normal rate and regular rhythm.  Pulmonary:     Effort: Pulmonary effort is normal. No respiratory distress.     Breath sounds: Normal breath sounds. No wheezing.  Abdominal:     General: Bowel sounds are normal. There is no distension.     Palpations: Abdomen is soft.     Tenderness: There is no abdominal tenderness.  Musculoskeletal:     Cervical back: Normal range of motion and neck supple.     Comments: Incision noted along thoracic spine healing well.  Appropriately tender to palpation  Skin:    General: Skin  is warm and dry.  Neurological:     General: No focal deficit present.     Mental Status: She is alert.  Psychiatric:        Mood and Affect: Mood normal.      Consultants:  Neurosurgery Oncology  Procedures:    Data Reviewed: No results found for this or any previous visit (from the past 24 hours).  I have reviewed pertinent nursing notes, vitals, labs, and images as necessary. I have ordered labwork to follow up on as indicated.  I have reviewed the last notes from staff over past 24 hours. I have discussed patient's care plan and test results with nursing staff, CM/SW, and other staff as appropriate.  Time spent: Greater than 50% of the 55 minute visit was spent in counseling/coordination of care for the patient as laid out in the A&P.   LOS: 16 days   Alm Apo, MD [facility] 04/07/2024, 3:45 PM

## 2024-04-08 DIAGNOSIS — C8510 Unspecified B-cell lymphoma, unspecified site: Secondary | ICD-10-CM | POA: Diagnosis not present

## 2024-04-08 MED ORDER — METOPROLOL SUCCINATE ER 50 MG PO TB24
50.0000 mg | ORAL_TABLET | Freq: Every day | ORAL | Status: DC
  Administered 2024-04-08 – 2024-04-09 (×2): 50 mg via ORAL
  Filled 2024-04-08 (×2): qty 1

## 2024-04-08 MED ORDER — LABETALOL HCL 5 MG/ML IV SOLN
10.0000 mg | INTRAVENOUS | Status: DC | PRN
  Administered 2024-04-08: 10 mg via INTRAVENOUS

## 2024-04-08 MED ORDER — LABETALOL HCL 5 MG/ML IV SOLN
INTRAVENOUS | Status: AC
  Administered 2024-04-08: 10 mg via INTRAVENOUS
  Filled 2024-04-08: qty 4

## 2024-04-08 NOTE — Progress Notes (Signed)
 Progress Note    Lynn Hogan   FMW:[id number]  DOB: 07-06-1945  DOA: 03/21/2024     17 PCP: Joshua Franky Sharper, PA-C  Initial CC: Back pain  Hospital Course: 78 y.o. female with medical history significant of malignant melanoma of the left forearm status post chemotherapy and surgery years ago, coronary artery disease, hyperlipidemia, essential hypertension, ischemic cardiomyopathy, degenerative disc disease who presented to the ER at [facility] with back pain.  MRI showed metastatic disease at T9 and T10 as well as T8.  Seen by neurosurgery and oncology.    Now s/p decompressive thoracic laminectomy and medial facetectomy and foraminotomy at T8-9, T9-10, T10-11 with transpedicular decompression T9 on the R with removal of epidural tumor.  Biopsy showing large b cell lymphoma.   Oncology following.   Plan for snf when able.   Assessment & Plan:   Back pain due to metastatic cancer Osseus metastatic disease at T9 with extension inferiorly to involve T10  T8 Metastasis  Moderate to Severe Stenosis at T9 - CT chest abdomen pelvis without evidence ofprimary malignancy or soft tissue metastasis within the chest, abdomen, or pelvis - MRI brain did not show any metastatic disease.  - Seen by oncology, appreciate continued assistance - Dr. Deanne to follow Monday - Now s/p decompressive thoracic laminectomy and medial facetectomy and foraminotomy at T8-9, T9-10, T10-11 with transpedicular decompression T9 on the R with removal of epidural tumor.  - Posterolateral arthrodesis T7-T12 ultizing locally harvested morselized autologous bone graft and morselized allograft.  Segmental fixation T7-T12 utilizing alphatec pedicle screws.   - Path with large B cell lymphoma - per oncology - Decadron  d/c'd 10/4 - Outpatient follow-up with oncology planned for PET scan and beginning treatment   History of Melanoma - Malignant melanoma of L forearm 03/13/2022    Hypertension -  Metoprolol    Sinus Bradycardia -Metoprolol  on hold   Dyslipidemia - Lipitor , zetia    CAD - statin   Prediabetes - A1c 5.7   Constipation - Bowel regimen   Interval History:  No events overnight.  Declined from insurance for rehab.  Peer to peer denied also. Home is being arranged for discharge tomorrow.  Old records reviewed in assessment of this patient  Antimicrobials:   DVT prophylaxis:  SCD's Start: 03/25/24 2034   Code Status:   Code Status: Full Code  Mobility Assessment (Last 72 Hours)     Mobility Assessment     Row Name 04/08/24 0800 04/07/24 2046 04/07/24 2045 04/07/24 1602 04/07/24 1201   Does the patient have exclusion criteria? No - Perform mobility assessment No - Perform mobility assessment No - Perform mobility assessment No - Perform mobility assessment No - Perform mobility assessment   Mobility Assessment Exclusion Criteria No exclusion criteria present, perform mobility assessment -- -- -- --   What is the highest level of mobility based on the mobility assessment? Level 4 (Ambulates with assistance) - Balance while stepping forward/back - Complete Level 4 (Ambulates with assistance) - Balance while stepping forward/back - Complete Level 4 (Ambulates with assistance) - Balance while stepping forward/back - Complete Level 4 (Ambulates with assistance) - Balance while stepping forward/back - Complete Level 4 (Ambulates with assistance) - Balance while stepping forward/back - Complete   Is the above level different from baseline mobility prior to current illness? Yes - Recommend PT order Yes - Recommend PT order Yes - Recommend PT order Yes - Recommend PT order Yes - Recommend PT order    Row Name  04/07/24 1147 04/07/24 0921 04/07/24 0745 04/06/24 1957 04/06/24 1600   Does the patient have exclusion criteria? -- -- No - Perform mobility assessment No - Perform mobility assessment No - Perform mobility assessment   Mobility Assessment Exclusion Criteria --  -- -- No exclusion criteria present, perform mobility assessment --   What is the highest level of mobility based on the mobility assessment? Level 4 (Ambulates with assistance) - Balance while stepping forward/back - Complete Level 4 (Ambulates with assistance) - Balance while stepping forward/back - Complete Level 4 (Ambulates with assistance) - Balance while stepping forward/back - Complete Level 4 (Ambulates with assistance) - Balance while stepping forward/back - Complete Level 4 (Ambulates with assistance) - Balance while stepping forward/back - Complete   Is the above level different from baseline mobility prior to current illness? -- -- Yes - Recommend PT order Yes - Recommend PT order Yes - Recommend PT order    Row Name 04/06/24 1155 04/06/24 0800 04/05/24 2000       Does the patient have exclusion criteria? No - Perform mobility assessment No - Perform mobility assessment No - Perform mobility assessment     Mobility Assessment Exclusion Criteria -- No exclusion criteria present, perform mobility assessment No exclusion criteria present, perform mobility assessment     What is the highest level of mobility based on the mobility assessment? Level 4 (Ambulates with assistance) - Balance while stepping forward/back - Complete Level 4 (Ambulates with assistance) - Balance while stepping forward/back - Complete Level 4 (Ambulates with assistance) - Balance while stepping forward/back - Complete     Is the above level different from baseline mobility prior to current illness? Yes - Recommend PT order Yes - Recommend PT order Yes - Recommend PT order        Barriers to discharge: None Disposition Plan: Home HH orders placed: N/A Status is: Inpatient  Objective: Blood pressure (!) 120/96, pulse 94, temperature 98.7 F (37.1 C), temperature source Oral, resp. rate 19, height 5' 1 (1.549 m), weight 80.7 kg, SpO2 91%.  Examination:  Physical Exam Constitutional:      Appearance: Normal  appearance.  HENT:     Head: Normocephalic and atraumatic.     Mouth/Throat:     Mouth: Mucous membranes are moist.  Eyes:     Extraocular Movements: Extraocular movements intact.  Cardiovascular:     Rate and Rhythm: Normal rate and regular rhythm.  Pulmonary:     Effort: Pulmonary effort is normal. No respiratory distress.     Breath sounds: Normal breath sounds. No wheezing.  Abdominal:     General: Bowel sounds are normal. There is no distension.     Palpations: Abdomen is soft.     Tenderness: There is no abdominal tenderness.  Musculoskeletal:     Cervical back: Normal range of motion and neck supple.     Comments: Incision noted along thoracic spine healing well.  Appropriately tender to palpation  Skin:    General: Skin is warm and dry.  Neurological:     General: No focal deficit present.     Mental Status: She is alert.  Psychiatric:        Mood and Affect: Mood normal.      Consultants:  Neurosurgery Oncology  Procedures:    Data Reviewed: No results found for this or any previous visit (from the past 24 hours).  I have reviewed pertinent nursing notes, vitals, labs, and images as necessary. I have ordered labwork to follow  up on as indicated.  I have reviewed the last notes from staff over past 24 hours. I have discussed patient's care plan and test results with nursing staff, CM/SW, and other staff as appropriate.    LOS: 17 days   Alm Apo, MD [facility] 04/08/2024, 2:11 PM

## 2024-04-08 NOTE — Plan of Care (Signed)
  Problem: Education: Goal: Knowledge of General Education information will improve Description: Including pain rating scale, medication(s)/side effects and non-pharmacologic comfort measures Outcome: Progressing   Problem: Health Behavior/Discharge Planning: Goal: Ability to manage health-related needs will improve Outcome: Progressing   Problem: Clinical Measurements: Goal: Ability to maintain clinical measurements within normal limits will improve Outcome: Progressing Goal: Will remain free from infection Outcome: Progressing Goal: Diagnostic test results will improve Outcome: Progressing Goal: Respiratory complications will improve Outcome: Progressing Goal: Cardiovascular complication will be avoided Outcome: Progressing   Problem: Nutrition: Goal: Adequate nutrition will be maintained Outcome: Progressing   Problem: Coping: Goal: Level of anxiety will decrease Outcome: Progressing   Problem: Elimination: Goal: Will not experience complications related to bowel motility Outcome: Progressing Goal: Will not experience complications related to urinary retention Outcome: Progressing   Problem: Pain Managment: Goal: General experience of comfort will improve and/or be controlled Outcome: Progressing   Problem: Safety: Goal: Ability to remain free from injury will improve Outcome: Progressing   Problem: Skin Integrity: Goal: Risk for impaired skin integrity will decrease Outcome: Progressing   Problem: Education: Goal: Ability to verbalize activity precautions or restrictions will improve Outcome: Progressing Goal: Knowledge of the prescribed therapeutic regimen will improve Outcome: Progressing Goal: Understanding of discharge needs will improve Outcome: Progressing   Problem: Activity: Goal: Ability to avoid complications of mobility impairment will improve Outcome: Progressing Goal: Ability to tolerate increased activity will improve Outcome:  Progressing Goal: Will remain free from falls Outcome: Progressing   Problem: Bowel/Gastric: Goal: Gastrointestinal status for postoperative course will improve Outcome: Progressing   Problem: Clinical Measurements: Goal: Ability to maintain clinical measurements within normal limits will improve Outcome: Progressing Goal: Postoperative complications will be avoided or minimized Outcome: Progressing Goal: Diagnostic test results will improve Outcome: Progressing   Problem: Pain Management: Goal: Pain level will decrease Outcome: Progressing   Problem: Skin Integrity: Goal: Will show signs of wound healing Outcome: Progressing   Problem: Health Behavior/Discharge Planning: Goal: Identification of resources available to assist in meeting health care needs will improve Outcome: Progressing   Problem: Bladder/Genitourinary: Goal: Urinary functional status for postoperative course will improve Outcome: Progressing

## 2024-04-08 NOTE — Progress Notes (Signed)
 PT Cancellation Note  Patient Details Name: Lynn Hogan MRN: [id number] DOB: 03-Oct-1945   Cancelled Treatment:    Reason Eval/Treat Not Completed: (P) Patient declined, no reason specified (Pt reports pain and declines session at this time. Will follow up as schedule allows.)   [name] 04/08/2024, 12:39 PM

## 2024-04-08 NOTE — Progress Notes (Signed)
 Occupational Therapy Treatment Patient Details Name: Lynn Hogan MRN: [id number] DOB: 25-Apr-1946 Today's Date: 04/08/2024   History of present illness Pt is a 78 y.o. female presenting 9/22 with progressive back pain. 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 S/p decompressive thoracic laminectomy/medial facetectomy, foraminotomy T8-11 with posterolateral arthridesis T7-12, allograft, segmental fixation T7-12. PMH: melanoma if L forearm, CAD, HLD, essential HTN, ischemic cardiomyopathy, DDD, NSTEMI   OT comments  Patient received in supine and asking for shower.  Patient able to get to EOB and donn socks with figure 4 and supervision.  Patient required assistance for back brace and ambulated to bathroom with RW.  Patient able to perform shower seated on shower chair with cues for back precautions and mod assist for LB.  Patient performed dressing with cues for back precautions and returned to supine due to fatigue.  Discharge recommendations continue to be appropriate. Acute OT to continue to follow to address established goals.       If plan is discharge home, recommend the following:  A little help with bathing/dressing/bathroom;Assistance with cooking/housework;Assist for transportation   Equipment Recommendations  None recommended by OT    Recommendations for Other Services      Precautions / Restrictions Precautions Precautions: Back;Fall Precaution Booklet Issued: Yes (comment) Recall of Precautions/Restrictions: Intact Required Braces or Orthoses: Spinal Brace Spinal Brace: Thoracolumbosacral orthotic;Applied in sitting position Restrictions Weight Bearing Restrictions Per Provider Order: No       Mobility Bed Mobility Overal bed mobility: Needs Assistance Bed Mobility: Supine to Sit, Sit to Supine     Supine to sit: Supervision, HOB elevated, Used rails Sit  to supine: Supervision, HOB elevated   General bed mobility comments: able to perform and maintain back precautions    Transfers Overall transfer level: Needs assistance Equipment used: Rolling walker (2 wheels) Transfers: Sit to/from Stand Sit to Stand: Contact guard assist           General transfer comment: CGA for safety and cues for hand placement     Balance Overall balance assessment: Needs assistance Sitting-balance support: No upper extremity supported, Feet supported Sitting balance-Leahy Scale: Good Sitting balance - Comments: seated EOB   Standing balance support: Single extremity supported, Bilateral upper extremity supported, During functional activity Standing balance-Leahy Scale: Fair Standing balance comment: able to stand at sink for grooming tasks with one extremity support                           ADL either performed or assessed with clinical judgement   ADL Overall ADL's : Needs assistance/impaired     Grooming: Wash/dry hands;Wash/dry face;Brushing hair;Contact guard assist;Standing   Upper Body Bathing: Minimal assistance;Sitting Upper Body Bathing Details (indicate cue type and reason): in shower Lower Body Bathing: Maximal assistance;Sit to/from stand Lower Body Bathing Details (indicate cue type and reason): firgure four for feet Upper Body Dressing : Set up;Supervision/safety;Sitting;Cueing for compensatory techniques Upper Body Dressing Details (indicate cue type and reason): assistance for brace Lower Body Dressing: Supervision/safety;Cueing for compensatory techniques;Cueing for back precautions Lower Body Dressing Details (indicate cue type and reason): donned socks with figure four technique         Tub/ Shower Transfer: Walk-in shower;Contact guard assist;Ambulation;Grab bars Tub/Shower Transfer Details (indicate cue type and reason): cues for safety Functional mobility during ADLs: Contact guard assist;Rolling walker (2  wheels) General ADL Comments: seen for  shower with cues for back precautions and safety    Extremity/Trunk Assessment              Vision       Perception     Praxis     Communication Communication Communication: No apparent difficulties   Cognition Arousal: Alert Behavior During Therapy: WFL for tasks assessed/performed Cognition: No apparent impairments             OT - Cognition Comments: occasional cues for back precautions                 Following commands: Intact        Cueing   Cueing Techniques: Verbal cues, Tactile cues, Visual cues  Exercises      Shoulder Instructions       General Comments      Pertinent Vitals/ Pain       Pain Assessment Pain Assessment: Faces Faces Pain Scale: Hurts little more Pain Location: back Pain Descriptors / Indicators: Discomfort, Grimacing Pain Intervention(s): Limited activity within patient's tolerance, Monitored during session, Repositioned  Home Living                                          Prior Functioning/Environment              Frequency  Min 2X/week        Progress Toward Goals  OT Goals(current goals can now be found in the care plan section)  Progress towards OT goals: Progressing toward goals  Acute Rehab OT Goals Patient Stated Goal: to get better OT Goal Formulation: With patient Time For Goal Achievement: 04/09/24 Potential to Achieve Goals: Good ADL Goals Pt Will Perform Grooming: with supervision;standing Pt Will Perform Lower Body Dressing: with contact guard assist;sit to/from stand;with adaptive equipment Pt Will Transfer to Toilet: with contact guard assist;ambulating Additional ADL Goal #1: pt will demonstrate safe use of LRAD during mobility Additional ADL Goal #2: pt will maintain spinal precautions during all ADL without cues.  Plan      Co-evaluation                 AM-PAC OT 6 Clicks Daily Activity     Outcome  Measure   Help from another person eating meals?: None Help from another person taking care of personal grooming?: None Help from another person toileting, which includes using toliet, bedpan, or urinal?: None Help from another person bathing (including washing, rinsing, drying)?: A Lot Help from another person to put on and taking off regular upper body clothing?: None Help from another person to put on and taking off regular lower body clothing?: A Little 6 Click Score: 21    End of Session Equipment Utilized During Treatment: Rolling walker (2 wheels);Gait belt;Back brace  OT Visit Diagnosis: Unsteadiness on feet (R26.81);Muscle weakness (generalized) (M62.81);Pain Pain - part of body:  (back)   Activity Tolerance Patient tolerated treatment well   Patient Left in bed;with call bell/phone within reach;with bed alarm set   Nurse Communication Mobility status        Time: 9281-9187 OT Time Calculation (min): 54 min  Charges: OT General Charges $OT Visit: 1 Visit OT Treatments $Self Care/Home Management : 53-67 mins  Dick Laine, OTA Acute Rehabilitation Services  Office [phone]   Jeb LITTIE Laine 04/08/2024, 8:21 AM

## 2024-04-08 NOTE — Progress Notes (Signed)
 Pt called saying her heart was beating out of her chest. Upon arrival in the room, vitals were abruptly checked and her pulse was 204. Patient stated she had the same episode last week. An EKG was performed. Dr. Patsy notified. IV labetalol  ordered and pt was restarted on her home metoprolol . Upon receiving IV labetalol , pt's HR lowered to the 80's. Pt felt much better and oral metoprolol  given as well.

## 2024-04-09 ENCOUNTER — Other Ambulatory Visit (HOSPITAL_COMMUNITY): Payer: Self-pay

## 2024-04-09 DIAGNOSIS — C8510 Unspecified B-cell lymphoma, unspecified site: Secondary | ICD-10-CM | POA: Diagnosis not present

## 2024-04-09 DIAGNOSIS — Z981 Arthrodesis status: Secondary | ICD-10-CM | POA: Diagnosis not present

## 2024-04-09 MED ORDER — METOPROLOL SUCCINATE ER 50 MG PO TB24
50.0000 mg | ORAL_TABLET | Freq: Every day | ORAL | 3 refills | Status: DC
  Filled 2024-04-09: qty 30, 30d supply, fill #0

## 2024-04-09 MED ORDER — OXYCODONE HCL 5 MG PO TABS
5.0000 mg | ORAL_TABLET | ORAL | 0 refills | Status: DC | PRN
  Filled 2024-04-09: qty 30, 5d supply, fill #0

## 2024-04-09 MED ORDER — METOPROLOL SUCCINATE ER 25 MG PO TB24: 25.0000 mg | ORAL_TABLET | Freq: Every day | ORAL | Status: AC

## 2024-04-09 MED ORDER — ONDANSETRON 4 MG PO TBDP
4.0000 mg | ORAL_TABLET | Freq: Three times a day (TID) | ORAL | 0 refills | Status: AC | PRN
  Filled 2024-04-09: qty 30, 10d supply, fill #0

## 2024-04-09 MED ORDER — METHOCARBAMOL 500 MG PO TABS
500.0000 mg | ORAL_TABLET | Freq: Four times a day (QID) | ORAL | 0 refills | Status: DC | PRN
  Filled 2024-04-09: qty 30, 8d supply, fill #0

## 2024-04-09 NOTE — Plan of Care (Signed)
   Problem: Education: Goal: Knowledge of General Education information will improve Description Including pain rating scale, medication(s)/side effects and non-pharmacologic comfort measures Outcome: Progressing   Problem: Clinical Measurements: Goal: Will remain free from infection Outcome: Progressing Goal: Respiratory complications will improve Outcome: Progressing Goal: Cardiovascular complication will be avoided Outcome: Progressing

## 2024-04-09 NOTE — Discharge Summary (Signed)
 Physician Discharge Summary   Lynn Hogan FMW:[id number] DOB: 25-Jan-1946 DOA: 03/21/2024  PCP: Joshua Franky Sharper, PA-C  Admit date: 03/21/2024 Discharge date: 04/09/2024  Admitted From: Home Disposition:  Home Discharging physician: Alm Apo, MD Barriers to discharge: None  Recommendations at discharge: Follow-up with oncology  Discharge Condition: stable CODE STATUS: Full Diet recommendation:  Diet Orders (From admission, onward)     Start     Ordered   04/09/24 0000  Diet general        04/09/24 1104   03/25/24 1927  Diet regular Room service appropriate? Yes; Fluid consistency: Thin  Diet effective now       Question Answer Comment  Room service appropriate? Yes   Fluid consistency: Thin      03/25/24 1926            Hospital Course: 78 y.o. 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  MRI showed metastatic disease at T9 and T10 as well as T8.  Seen by neurosurgery and oncology.    Now s/p decompressive thoracic laminectomy and medial facetectomy and foraminotomy at T8-9, T9-10, T10-11 with transpedicular decompression T9 on the R with removal of epidural tumor.  Biopsy showing large b cell lymphoma.   Oncology following.  Outpatient follow-up planned.   Assessment & Plan:   Back pain due to metastatic cancer Osseus metastatic disease at T9 with extension inferiorly to involve T10  T8 Metastasis  Moderate to Severe Stenosis at T9 - CT chest abdomen pelvis without evidence ofprimary malignancy or soft tissue metastasis within the chest, abdomen, or pelvis - MRI brain did not show any metastatic disease.  - Seen by oncology, appreciate continued assistance - Dr. Deanne to follow Monday - Now  s/p decompressive thoracic laminectomy and medial facetectomy and foraminotomy at T8-9, T9-10, T10-11 with transpedicular decompression T9 on the R with removal of epidural tumor.  - Posterolateral arthrodesis T7-T12 ultizing locally harvested morselized autologous bone graft and morselized allograft.  Segmental fixation T7-T12 utilizing alphatec pedicle screws.   - Path with large B cell lymphoma - per oncology - Decadron  d/c'd 10/4 - Outpatient follow-up with oncology planned for PET scan and beginning treatment   History of Melanoma - Malignant melanoma of L forearm 03/13/2022    Hypertension - Metoprolol    Sinus Bradycardia - Toprol  resumed at lower dose   Dyslipidemia - Lipitor , zetia    CAD - statin   Prediabetes - A1c 5.7   Constipation - Bowel regimen    The patient's acute and chronic medical conditions were treated accordingly. On day of discharge, patient was felt deemed stable for discharge. Patient/family member advised to call PCP or come back to ER if needed.   Principal Diagnosis: Large B-cell lymphoma (HCC)  Discharge Diagnoses: Active Hospital Problems   Diagnosis Date Noted   Large B-cell lymphoma (HCC) 04/06/2024    Priority: 1.   Spinal cord mass (HCC) 03/21/2024    Priority: 1.   Status post thoracic spinal fusion 03/25/2024    Priority: 2.   Bone lesion 01/25/2024   History of heart failure 01/25/2024   Hypertension 01/25/2024   Malignant melanoma of left forearm (HCC) 02/12/2022   CAD (coronary artery disease) 08/11/2018   Hyperlipidemia 07/31/2018    Resolved Hospital Problems  No resolved problems to display.  Discharge Instructions     Diet general   Complete by: As directed    Increase activity slowly   Complete by: As directed    No wound care   Complete by: As directed       Allergies as of 04/09/2024       Reactions   Ibuprofen Palpitations   Nsaids Palpitations   Patient stated she has not tried all NSAIDS to see if  they give her palpitations, but she is not willing to try anything else- just in case        Medication List     TAKE these medications    acetaminophen  325 MG tablet Commonly known as: TYLENOL  Take 325 mg by mouth every 6 (six) hours as needed for mild pain (pain score 1-3).   aspirin  EC 81 MG tablet Take 1 tablet (81 mg total) by mouth daily.   atorvastatin  40 MG tablet Commonly known as: LIPITOR  Take 1 tablet (40 mg total) by mouth daily. What changed: when to take this   diclofenac  Sodium 1 % Gel Commonly known as: VOLTAREN  Apply 4 g topically 4 (four) times daily. What changed:  when to take this reasons to take this   ezetimibe  10 MG tablet Commonly known as: ZETIA  TAKE 1 TABLET EVERY DAY (NEED MD APPOINTMENT FOR REFILLS)   methocarbamol  500 MG tablet Commonly known as: ROBAXIN  Take 1 tablet (500 mg total) by mouth every 6 (six) hours as needed for muscle spasms.   metoprolol  succinate 25 MG 24 hr tablet Commonly known as: TOPROL -XL Take 1 tablet (25 mg total) by mouth daily. TAKE 1 TABLET EVERY DAY WITH OR IMMEDIATELY FOLLOWING A MEAL. Please call [phone] to schedule an overdue appointment for future refills. Thank you. What changed:  medication strength how much to take how to take this when to take this   nitroGLYCERIN  0.4 MG SL tablet Commonly known as: NITROSTAT  Place 1 tablet (0.4 mg total) under the tongue every 5 (five) minutes as needed for chest pain.   ondansetron  4 MG disintegrating tablet Commonly known as: ZOFRAN -ODT Take 1 tablet (4 mg total) by mouth every 8 (eight) hours as needed for nausea or vomiting.   oxyCODONE  5 MG immediate release tablet Commonly known as: Oxy IR/ROXICODONE  Take 1 tablet (5 mg total) by mouth every 4 (four) hours as needed for breakthrough pain or severe pain (pain score 7-10).   Vitamin D3 10 MCG (400 UNIT) tablet Take 400 Units by mouth every evening.               Durable Medical Equipment   (From admission, onward)           Start     Ordered   03/25/24 2034  DME Walker rolling  Once       Question:  Patient needs a walker to treat with the following condition  Answer:  S/P lumbar fusion   03/25/24 2033   03/25/24 2034  DME 3 n 1  Once        03/25/24 2033            Contact information for follow-up providers     Joshua Franky Sharper, PA-C Follow up in 1 week(s).   Specialty: Internal Medicine Contact information: [address] [place] [address] [phone]         [name], MD. Schedule an appointment as soon as possible for a visit in 2 week(s).   Specialty: Neurosurgery Contact information: [phone]  [address] [place] [address] [phone]              Contact information for after-discharge care     Destination     [facility], [place] .   Service: Skilled Nursing Contact information: [address] [place]  [phone]                    Allergies  Allergen Reactions   Ibuprofen Palpitations   Nsaids Palpitations    Patient stated she has not tried all NSAIDS to see if they give her palpitations, but she is not willing to try anything else- just in case    Consultants:  Neurosurgery Oncology  Procedures:   Discharge Exam: BP (!) 109/48 (BP Location: Left Arm)   Pulse 74   Temp 98.7 F (37.1 C) (Oral)   Resp 18   Ht 5' 1 (1.549 m)   Wt 80.7 kg   SpO2 91%   BMI 33.62 kg/m  Physical Exam Constitutional:      Appearance: Normal appearance.  HENT:     Head: Normocephalic and atraumatic.     Mouth/Throat:     Mouth: Mucous membranes are moist.  Eyes:     Extraocular Movements: Extraocular movements intact.  Cardiovascular:     Rate and Rhythm: Normal rate and regular rhythm.  Pulmonary:     Effort: Pulmonary effort is normal. No respiratory distress.     Breath sounds: Normal breath sounds. No wheezing.  Abdominal:      General: Bowel sounds are normal. There is no distension.     Palpations: Abdomen is soft.     Tenderness: There is no abdominal tenderness.  Musculoskeletal:     Cervical back: Normal range of motion and neck supple.     Comments: Incision noted along thoracic spine healing well.  Appropriately tender to palpation  Skin:    General: Skin is warm and dry.  Neurological:     General: No focal deficit present.     Mental Status: She is alert.  Psychiatric:        Mood and Affect: Mood normal.      The results of significant diagnostics from this hospitalization (including imaging, microbiology, ancillary and laboratory) are listed below for reference.   Microbiology: No results found for this or any previous visit (from the past 240 hours).   Labs: BNP (last 3 results) No results for input(s): BNP in the last 8760 hours. Basic Metabolic Panel: Recent Labs  Lab 04/05/24 0512  NA 135  K 3.7  CL 103  CO2 24  GLUCOSE 116*  BUN 12  CREATININE 0.62  CALCIUM  8.4*  MG 1.9  PHOS 2.7   Liver Function Tests: Recent Labs  Lab 04/05/24 0512  AST 24  ALT 13  ALKPHOS 66  BILITOT 0.6  PROT 5.5*  ALBUMIN 2.4*   No results for input(s): LIPASE, AMYLASE in the last 168 hours. No results for input(s): AMMONIA in the last 168 hours. CBC: Recent Labs  Lab 04/05/24 0512  WBC 10.0  NEUTROABS 6.1  HGB 10.8*  HCT 33.8*  MCV 97.4  PLT 308   Cardiac Enzymes: No results for input(s): CKTOTAL, CKMB, CKMBINDEX, TROPONINI in the last 168 hours. BNP: Invalid input(s): POCBNP CBG: No results for input(s): GLUCAP in the last 168 hours. D-Dimer No results for input(s): DDIMER in the last 72 hours. Hgb A1c No results for input(s): HGBA1C in the last 72  hours. Lipid Profile No results for input(s): CHOL, HDL, LDLCALC, TRIG, CHOLHDL, LDLDIRECT in the last 72 hours. Thyroid function studies No results for input(s): TSH, T4TOTAL, T3FREE,  THYROIDAB in the last 72 hours.  Invalid input(s): FREET3 Anemia work up No results for input(s): VITAMINB12, FOLATE, FERRITIN, TIBC, IRON, RETICCTPCT in the last 72 hours. Urinalysis    Component Value Date/Time   COLORURINE YELLOW 03/21/2024 1238   APPEARANCEUR CLEAR 03/21/2024 1238   LABSPEC 1.020 03/21/2024 1238   PHURINE 5.5 03/21/2024 1238   GLUCOSEU NEGATIVE 03/21/2024 1238   HGBUR SMALL (A) 03/21/2024 1238   BILIRUBINUR NEGATIVE 03/21/2024 1238   BILIRUBINUR negative 01/13/2024 1826   KETONESUR NEGATIVE 03/21/2024 1238   PROTEINUR NEGATIVE 03/21/2024 1238   UROBILINOGEN 0.2 01/13/2024 1826   NITRITE NEGATIVE 03/21/2024 1238   LEUKOCYTESUR NEGATIVE 03/21/2024 1238   Sepsis Labs Recent Labs  Lab 04/05/24 0512  WBC 10.0   Microbiology No results found for this or any previous visit (from the past 240 hours).  Procedures/Studies: DG Thoracic Spine 2 View Result Date: 03/25/2024 CLINICAL DATA:  Elective surgery.  T9-10 laminectomy for tumor. EXAM: THORACIC SPINE 2 VIEWS COMPARISON:  MRI thoracic spine 03/21/2024 FINDINGS: Intraoperative fluoroscopy is obtained for surgical control purposes. Fluoroscopy time is recorded at 3 minutes 44 seconds. Dose 70.8 mGy. 5 spot fluoroscopic images are obtained. Spot fluoroscopic images provided demonstrate postoperative changes with posterior rod and screw fixation from T6 through T12, crossing the involved T9 and T10 vertebrae. No anterior subluxations. IMPRESSION: Intraoperative fluoroscopy is obtained for surgical control purposes demonstrating rod and screw fixation of the mid/lower thoracic spine. Electronically Signed   By: Elsie Gravely M.D.   On: 03/25/2024 20:15   DG C-Arm 1-60 Min-No Report Result Date: 03/25/2024 Fluoroscopy was utilized by the requesting physician.  No radiographic interpretation.   DG C-Arm 1-60 Min-No Report Result Date: 03/25/2024 Fluoroscopy was utilized by the requesting physician.  No  radiographic interpretation.   DG C-Arm 1-60 Min-No Report Result Date: 03/25/2024 Fluoroscopy was utilized by the requesting physician.  No radiographic interpretation.   MR BRAIN W WO CONTRAST Result Date: 03/24/2024 EXAM: MRI BRAIN WITH AND WITHOUT CONTRAST 03/24/2024 12:09:00 PM TECHNIQUE: Multiplanar multisequence MRI of the head/brain was performed with and without the administration of intravenous contrast. 8 mL gadobutrol  (GADAVIST ) 1 MMOL/ML injection 8 mL GADOBUTROL  1 MMOL/ML IV SOLN. COMPARISON: None available. CLINICAL HISTORY: Metastatic disease evaluation. FINDINGS: BRAIN AND VENTRICLES: No acute infarct. No acute intracranial hemorrhage. No mass effect or midline shift. No hydrocephalus. No evidence of metastatic disease to the brain. There is age-related atrophy and moderate diffuse cerebral white matter disease. 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 The sella is unremarkable. Normal flow voids. No mass or abnormal enhancement. ORBITS: The patient is status post bilateral lens replacement. No acute abnormality. SINUSES: No acute abnormality. BONES AND SOFT TISSUES: Normal bone marrow signal and enhancement. No evidence of metastatic disease to the calvaria or skull base. No acute soft tissue abnormality. IMPRESSION: 1. No evidence of metastatic disease to the brain, calvaria, or skull base. 2. Age-related atrophy and moderate diffuse cerebral white matter disease. 3. 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 Electronically signed by: Evalene Coho MD 03/24/2024 01:00 PM EDT RP Workstation: [id number]   CT CHEST ABDOMEN PELVIS W CONTRAST Result Date:  03/22/2024 CLINICAL DATA:  Thoracic spine CT and MRI demonstrating metastatic disease centered about T9. Evaluate for primary malignancy. * Tracking  Code: BO * EXAM: CT CHEST, ABDOMEN, AND PELVIS WITH CONTRAST TECHNIQUE: Multidetector CT imaging of the chest, abdomen and pelvis was performed following the standard protocol during bolus administration of intravenous contrast. RADIATION DOSE REDUCTION: This exam was performed according to the departmental dose-optimization program which includes automated exposure control, adjustment of the mA and/or kV according to patient size and/or use of iterative reconstruction technique. CONTRAST:  75mL OMNIPAQUE  IOHEXOL  350 MG/ML SOLN COMPARISON:  Thoracolumbar spine MR 03/21/2024. CT stone study 01/13/2024. FINDINGS: CT CHEST FINDINGS Cardiovascular: Aortic atherosclerosis. Mild cardiomegaly, without pericardial effusion. Three vessel coronary artery calcification. No central pulmonary embolism, on this non-dedicated study. Mediastinum/Nodes: No supraclavicular adenopathy. No mediastinal or hilar adenopathy. Tiny hiatal hernia. Apparent soft tissue fullness within the gastroesophageal junction including on 40/3 is likely due to underdistention and redundancy in the setting of hiatal hernia. Lungs/Pleura: Trace left pleural fluid may be physiologic. Mild motion degradation throughout the chest. Bibasilar scarring. Apparent left apical pleural-based nodules x2, including an up to 5 mm on 23/5 are favored to be related to minimal pleuroparenchymal scarring. No suspicious dominant pulmonary nodule or mass. Musculoskeletal: Included within the abdomen pelvic section. CT ABDOMEN PELVIS FINDINGS Hepatobiliary: Normal liver. Normal gallbladder, without biliary ductal dilatation. Pancreas: Normal, without mass or ductal dilatation. Spleen: Normal in size, without focal abnormality. Adrenals/Urinary Tract: Normal adrenal glands. Interpolar subcentimeter left renal lesion is too small to characterize but favored to represent a cyst. No follow-up indicated. Normal right kidney. No hydronephrosis. Vascular calcifications anterior to  the left proximal ureter. Normal urinary bladder. Stomach/Bowel: Remainder of the stomach is underdistended. Colonic stool burden suggests constipation. Normal terminal ileum. Normal small bowel. Vascular/Lymphatic: Aortic atherosclerosis. No abdominopelvic adenopathy. Reproductive: Hysterectomy.  No adnexal mass. Other: No significant free fluid.  Mild pelvic floor laxity. Musculoskeletal: Bilateral L5 pars defects. Grade 2 L5-S1 anterolisthesis. Osseous metastasis centered about T9 and T10 were better evaluated on dedicated CT and MRI. IMPRESSION: 1. No evidence of primary malignancy or soft tissue metastasis within the chest, abdomen, or pelvis. 2. Minimally motion degraded evaluation of the chest. 3. T9 and T10 osseous metastasis, as before. 4. Tiny hiatal hernia. Apparent mild soft tissue fullness at the gastroesophageal junction is likely due to underdistention and redundancy. If there are symptoms to suggest distal esophageal mass, consider endoscopy. 5. Incidental findings, including: Coronary artery atherosclerosis. Aortic Atherosclerosis (ICD10-I70.0). Electronically Signed   By: Rockey Kilts M.D.   On: 03/22/2024 17:52   MR THORACIC SPINE W WO CONTRAST Result Date: 03/22/2024 CLINICAL DATA:  Metastatic disease evaluation. EXAM: MRI THORACIC AND LUMBAR SPINE WITHOUT AND WITH CONTRAST TECHNIQUE: Multiplanar and multiecho pulse sequences of the thoracic and lumbar spine were obtained without and with intravenous contrast. CONTRAST:  8mL GADAVIST  GADOBUTROL  1 MMOL/ML IV SOLN COMPARISON:  Same day CT of the thoracic spine. FINDINGS: MRI THORACIC SPINE FINDINGS Alignment:  No substantial sagittal subluxation. Vertebrae: Bulky enhancing metastatic lesion at T9 with extraosseous extension posteriorly into the canal with resulting moderate to severe canal stenosis. Enhancing tumor also extends into the adjacent T10 vertebral body, right T9-T10 foramen as well as the right greater than left T9 posterior  elements. A small focus of enhancement posteriorly at T8 is also compatible with a metastasis. Cord:  Normal cord signal. Paraspinal and other soft tissues: Extraosseous extension of tumor at T9 is described above but also extends into the  right paraspinal soft tissues. Disc levels: At T9, moderate to severe canal stenosis due to extraosseous extension of tumor is detailed above. Tumor also extends into and effaces the T9-T10 foramina bilaterally, greater on the right. Otherwise, no significant stenosis in the thoracic spine. MRI LUMBAR SPINE FINDINGS Segmentation:  Standard. Alignment:  Grade 2 anterolisthesis L5 on S1. Vertebrae: No fracture, evidence of discitis, or suspicious bone lesion. Bilateral L5 pars defects. Conus medullaris: Extends to the L1-L2 level and appears normal. Paraspinal and other soft tissues: Unremarkable. Disc levels: T12-L1: No significant disc protrusion, foraminal stenosis, or canal stenosis. L1-L2: No significant disc protrusion, foraminal stenosis, or canal stenosis. L2-L3: Disc bulging, ligamentum flavum thickening and mild facet arthropathy. No significant stenosis. L3-L4: Disc bulging, ligamentum flavum thickening facet arthropathy. No significant stenosis. L4-L5: Disc height loss. Disc bulging. Mild left foraminal stenosis. Patent canal and right foramen. L5-S1: Grade 2 anterolisthesis. Bilateral pars defects. Uncovering of the disc. Moderate bilateral foraminal stenosis. Patent canal. IMPRESSION: 1. Bulky osseous metastatic disease at T9 (as detailed above) with extension inferiorly to involve T10. Extraosseous extension into the canal, paraspinal soft tissues, and T9-T10 foramina. Resulting moderate to severe canal stenosis at T9 and effaced foramina. 2. Additional small metastasis at T8. 3. At L5-S1, grade 2 anterolisthesis with bilateral pars defects and moderate bilateral foraminal stenosis. Electronically Signed   By: Gilmore GORMAN Molt M.D.   On: 03/22/2024 00:12   MR  Lumbar Spine W Wo Contrast Result Date: 03/22/2024 CLINICAL DATA:  Metastatic disease evaluation. EXAM: MRI THORACIC AND LUMBAR SPINE WITHOUT AND WITH CONTRAST TECHNIQUE: Multiplanar and multiecho pulse sequences of the thoracic and lumbar spine were obtained without and with intravenous contrast. CONTRAST:  8mL GADAVIST  GADOBUTROL  1 MMOL/ML IV SOLN COMPARISON:  Same day CT of the thoracic spine. FINDINGS: MRI THORACIC SPINE FINDINGS Alignment:  No substantial sagittal subluxation. Vertebrae: Bulky enhancing metastatic lesion at T9 with extraosseous extension posteriorly into the canal with resulting moderate to severe canal stenosis. Enhancing tumor also extends into the adjacent T10 vertebral body, right T9-T10 foramen as well as the right greater than left T9 posterior elements. A small focus of enhancement posteriorly at T8 is also compatible with a metastasis. Cord:  Normal cord signal. Paraspinal and other soft tissues: Extraosseous extension of tumor at T9 is described above but also extends into the right paraspinal soft tissues. Disc levels: At T9, moderate to severe canal stenosis due to extraosseous extension of tumor is detailed above. Tumor also extends into and effaces the T9-T10 foramina bilaterally, greater on the right. Otherwise, no significant stenosis in the thoracic spine. MRI LUMBAR SPINE FINDINGS Segmentation:  Standard. Alignment:  Grade 2 anterolisthesis L5 on S1. Vertebrae: No fracture, evidence of discitis, or suspicious bone lesion. Bilateral L5 pars defects. Conus medullaris: Extends to the L1-L2 level and appears normal. Paraspinal and other soft tissues: Unremarkable. Disc levels: T12-L1: No significant disc protrusion, foraminal stenosis, or canal stenosis. L1-L2: No significant disc protrusion, foraminal stenosis, or canal stenosis. L2-L3: Disc bulging, ligamentum flavum thickening and mild facet arthropathy. No significant stenosis. L3-L4: Disc bulging, ligamentum flavum thickening  facet arthropathy. No significant stenosis. L4-L5: Disc height loss. Disc bulging. Mild left foraminal stenosis. Patent canal and right foramen. L5-S1: Grade 2 anterolisthesis. Bilateral pars defects. Uncovering of the disc. Moderate bilateral foraminal stenosis. Patent canal. IMPRESSION: 1. Bulky osseous metastatic disease at T9 (as detailed above) with extension inferiorly to involve T10. Extraosseous extension into the canal, paraspinal soft tissues, and T9-T10 foramina. Resulting moderate to severe  canal stenosis at T9 and effaced foramina. 2. Additional small metastasis at T8. 3. At L5-S1, grade 2 anterolisthesis with bilateral pars defects and moderate bilateral foraminal stenosis. Electronically Signed   By: Gilmore GORMAN Molt M.D.   On: 03/22/2024 00:12   CT Thoracic Spine Wo Contrast Result Date: 03/21/2024 CLINICAL DATA:  History of chronic back pain. Patient bent over to pick something up and felt a pop. Now with extreme mid back pain. 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 EXAM: CT THORACIC SPINE WITHOUT CONTRAST TECHNIQUE: Multidetector CT images of the thoracic were obtained using the standard protocol without intravenous contrast. RADIATION DOSE REDUCTION: This exam was performed according to the departmental dose-optimization program which includes automated exposure control, adjustment of the mA and/or kV according to patient size and/or use of iterative reconstruction technique. COMPARISON:  Abdominopelvic CT 01/13/2024.  PET-CT 11/03/2022. FINDINGS: Alignment: Minimal convex right scoliosis. No significant focal angulation or listhesis. Vertebrae: Tracer-avid metastatic disease was demonstrated within the T9 and T10 vertebral bodies on previous PET-CT. The disease within the T9 vertebral body has significantly progressed in the interval with a very large lytic lesion nearly replacing the vertebral body, measuring up to 4.2 x 3.7 cm transverse on axial image 102/9.  There is significant posterior extension of tumor into the spinal canal, expected to cause compression of the thoracic cord at this level. Mildly progressive tumor involving the superior endplate of T10. Associated significant soft tissue foraminal narrowing bilaterally at T9-10, worse on the right. No other definite lesions or fractures identified. Paraspinal and other soft tissues: As above, paraspinal tumor at T9 associated with lytic osseous metastatic disease and extending into the T9-10 foramina bilaterally. No definite paraspinal hematoma or other acute paraspinal findings. Aortic and coronary artery atherosclerosis noted. Pulmonary assessment limited by breathing artifact. There is perihilar atelectasis bilaterally. Grossly stable small left lung nodules. Disc levels: Mild spondylosis with mild endplate osteophyte formation. No large disc herniation identified. As above, significant spinal stenosis due to tumor at T9 and T9-10. IMPRESSION: 1. Significant progression of lytic osseous metastatic disease within the T9 vertebral body with significant posterior extension of tumor into the spinal canal, expected to cause compression of the thoracic cord at this level. Recommend spine surgical consultation and further evaluation with MRI of the thoracic spine without and with contrast. 2. Mildly progressive tumor involving the superior endplate of T10. 3. No other definite osseous metastases identified. 4.  Aortic Atherosclerosis (ICD10-I70.0). 5. Critical Value/emergent results were called by telephone at the time of interpretation on 03/21/2024 at 2:24 pm to provider JOSHUA LONG, who verbally acknowledged these results. Electronically Signed   By: Elsie Perone M.D.   On: 03/21/2024 14:25     Time coordinating discharge: Over 30 minutes    Alm Apo, MD  [facility] 04/09/2024, 1:38 PM

## 2024-04-10 ENCOUNTER — Telehealth: Admitting: Family

## 2024-04-10 DIAGNOSIS — R399 Unspecified symptoms and signs involving the genitourinary system: Secondary | ICD-10-CM | POA: Diagnosis not present

## 2024-04-10 MED ORDER — CEPHALEXIN 500 MG PO CAPS: 500.0000 mg | ORAL_CAPSULE | Freq: Two times a day (BID) | ORAL | 0 refills | Status: DC

## 2024-04-10 NOTE — Progress Notes (Signed)

## 2024-04-11 ENCOUNTER — Encounter (HOSPITAL_COMMUNITY): Payer: Self-pay

## 2024-04-11 ENCOUNTER — Other Ambulatory Visit: Payer: Self-pay | Admitting: *Deleted

## 2024-04-11 DIAGNOSIS — C8510 Unspecified B-cell lymphoma, unspecified site: Secondary | ICD-10-CM

## 2024-04-18 ENCOUNTER — Encounter (HOSPITAL_COMMUNITY)
Admission: RE | Admit: 2024-04-18 | Discharge: 2024-04-18 | Disposition: A | Discharge status: Home / Self Care | Source: Ambulatory Visit | Attending: Oncology | Admitting: Oncology

## 2024-04-18 DIAGNOSIS — C8510 Unspecified B-cell lymphoma, unspecified site: Secondary | ICD-10-CM | POA: Insufficient documentation

## 2024-04-18 LAB — GLUCOSE, CAPILLARY: Glucose-Capillary: 105 mg/dL — ABNORMAL HIGH (ref 70–99)

## 2024-04-18 MED ORDER — FLUDEOXYGLUCOSE F - 18 (FDG) INJECTION
8.8500 | Freq: Once | INTRAVENOUS | Status: AC
  Administered 2024-04-18: 8.88 via INTRAVENOUS

## 2024-04-20 ENCOUNTER — Encounter (HOSPITAL_COMMUNITY): Payer: Self-pay | Admitting: Radiology

## 2024-04-20 ENCOUNTER — Encounter: Payer: Self-pay | Admitting: Oncology

## 2024-04-20 ENCOUNTER — Inpatient Hospital Stay: Attending: Oncology | Admitting: Oncology

## 2024-04-20 ENCOUNTER — Encounter: Payer: Self-pay | Admitting: *Deleted

## 2024-04-20 VITALS — BP 127/64 | HR 64 | Temp 97.8°F | Resp 18 | Ht 61.0 in | Wt 172.0 lb

## 2024-04-20 DIAGNOSIS — I110 Hypertensive heart disease with heart failure: Secondary | ICD-10-CM | POA: Insufficient documentation

## 2024-04-20 DIAGNOSIS — C4362 Malignant melanoma of left upper limb, including shoulder: Secondary | ICD-10-CM | POA: Diagnosis present

## 2024-04-20 DIAGNOSIS — C8330 Diffuse large B-cell lymphoma, unspecified site: Secondary | ICD-10-CM | POA: Diagnosis not present

## 2024-04-20 DIAGNOSIS — C8510 Unspecified B-cell lymphoma, unspecified site: Secondary | ICD-10-CM

## 2024-04-20 DIAGNOSIS — Z5111 Encounter for antineoplastic chemotherapy: Secondary | ICD-10-CM | POA: Insufficient documentation

## 2024-04-20 DIAGNOSIS — C7951 Secondary malignant neoplasm of bone: Secondary | ICD-10-CM | POA: Insufficient documentation

## 2024-04-20 DIAGNOSIS — Z79899 Other long term (current) drug therapy: Secondary | ICD-10-CM | POA: Diagnosis not present

## 2024-04-20 DIAGNOSIS — E785 Hyperlipidemia, unspecified: Secondary | ICD-10-CM | POA: Diagnosis not present

## 2024-04-20 NOTE — Progress Notes (Signed)
 START ON PATHWAY REGIMEN - Lymphoma and CLL     A cycle is every 21 days:     Prednisone      Rituximab-xxxx      Cyclophosphamide      Doxorubicin      Vincristine   **Always confirm dose/schedule in your pharmacy ordering system**  Patient Characteristics: Diffuse Large B-Cell Lymphoma or Follicular Lymphoma, Grade 3B, First Line, Stage I and II, No Bulk Disease Type: Not Applicable Disease Type: Diffuse Large B-Cell Lymphoma Disease Type: Not Applicable Line of therapy: First Line Disease Characteristics: No Bulk Intent of Therapy: Curative Intent, Discussed with Patient

## 2024-04-20 NOTE — Progress Notes (Signed)
 [facility] OFFICE PROGRESS NOTE   Diagnosis: Large B-cell lymphoma  INTERVAL HISTORY:   Ms. Metzger was discharged from the hospital on 04/09/2024 after admission with back pain.  She underwent a decompressive thoracic laminectomy 03/25/2024.  She reports improvement in back pain.  She now has discomfort diffusely over the skin at the back and legs.  She is ambulating.  No difficulty with bowel or flatter function.  No fever or night sweats.  Objective:  Vital signs in last 24 hours:  Blood pressure 127/64, pulse 64, temperature 97.8 F (36.6 C), resp. rate 18, height 5' 1 (1.549 m), weight 172 lb (78 kg), SpO2 99%.    HEENT: No thrush Lymphatics: No cervical, supraclavicular, axillary, or inguinal nodes Resp: Lungs clear bilaterally Cardio: Regular rate and rhythm GI: No hepatosplenomegaly, no mass, nontender Vascular: No leg edema  Skin: Healed thoracic incision without evidence of infection.  No rash.   Lab Results:  Lab Results  Component Value Date   WBC 10.0 04/05/2024   HGB 10.8 (L) 04/05/2024   HCT 33.8 (L) 04/05/2024   MCV 97.4 04/05/2024   PLT 308 04/05/2024   NEUTROABS 6.1 04/05/2024    CMP  Lab Results  Component Value Date   NA 135 04/05/2024   K 3.7 04/05/2024   CL 103 04/05/2024   CO2 24 04/05/2024   GLUCOSE 116 (H) 04/05/2024   BUN 12 04/05/2024   CREATININE 0.62 04/05/2024   CALCIUM  8.4 (L) 04/05/2024   PROT 5.5 (L) 04/05/2024   ALBUMIN 2.4 (L) 04/05/2024   AST 24 04/05/2024   ALT 13 04/05/2024   ALKPHOS 66 04/05/2024   BILITOT 0.6 04/05/2024   GFRNONAA >60 04/05/2024   GFRAA >60 08/11/2018    No results found for: CEA1, CEA, CAN199, CA125  Lab Results  Component Value Date   INR 1.00 08/08/2018   LABPROT 13.1 08/08/2018    Imaging:  NM PET Image Initial (PI) Skull Base To Thigh (F-18 FDG) Result Date: 04/18/2024 EXAM: PET AND CT SKULL BASE TO MID THIGH 04/18/2024 01:21:10 PM TECHNIQUE:  RADIOPHARMACEUTICAL: 8.88 mCi F-18 FDG Uptake time 60 minutes. Glucose level 105 mg/dl. PET imaging was acquired from the base of the skull to the mid thighs. Non-contrast enhanced computed tomography was obtained for attenuation correction and anatomic localization. COMPARISON: CT 03/22/24 CLINICAL HISTORY: Hematologic malignancy, staging. 8.88 mCi F18 FDG IV RAC @ 1206 MM; BG - 105; dose and pt verified w/ CD; PET Scan for large B-cell lymphoma; hematologic malignancy, staging; Not diabetic; back surgery, rods and pins placed two weeks ago; no biopsies; no therapies or treatments; no OBMD; EOV FINDINGS: HEAD AND NECK: No metabolically active cervical lymphadenopathy. CHEST: There is a moderate layering right pleural effusion which is new from comparison exam. This is favored benign postsurgical. No metabolically active pulmonary nodules. No metabolically active mediastinal lymphadenopathy. ABDOMEN AND PELVIS: No metabolically active intraperitoneal mass. No metabolically active abdominal or pelvic lymph nodes. The spleen is normal volume and normal metabolic activity. Physiologic activity within the gastrointestinal and genitourinary systems. BONES AND SOFT TISSUE: Intense hypermetabolic activity is associated with the T9 anterior bony vertebral body, with SUV max equal to 22.3 on image 86. There is an aggressive soft tissue lesion with bone destruction (image 8). This is consistent with the given history of lymphoma. There is interval posterior thoracic fusion superior and inferior to the T9 vertebral body. No evidence of additional sites of lymphoma bone involvement. No evidence of lymphoma other than the  T9 vertebral lesion. IMPRESSION: 1. Intense metabolic activity associated with the aggressive T9 lesion consistent with lymphoma, with interval posterior thoracic fusion above and below T9. 2. No evidence of additional osseous lymphoma involvement. 3. No evidence of metastatic lymphadenopathy. Normal spleen. No  evidence of lymphoma elsewhere. 4. New moderate right pleural effusion, favored benign postsurgical. Electronically signed by: Norleen Boxer MD 04/18/2024 03:10 PM EDT RP Workstation: [id number]    Medications: I have reviewed the patient's current medications.   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  Large B-cell lymphoma, stage I E, IPI-1, CNS IPI-1 03/21/2024 CT thoracic spine: Progression of lytic osseous metastatic disease at T9 with significant posterior extension into the spinal canal, mild progressive tumor at the superior endplate of T10 03/21/2024 MRI thoracic and lumbar spine: Metastatic disease at T9 with extension to T10, extraosseous extension into the spinal canal and paraspinal soft tissues, moderate to severe canal stenosis at T9, small T8 metastasis 03/22/2024 CTs: Left apical pleural-based nodules favored scarring, no evidence of a primary malignancy or soft tissue metastasis, T9 and T10 metastases 03/24/2024 MRI brain: No evidence of metastatic disease, 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  Ki-67-60%, germinal center phenotype, MYC (8q24) not detected, MYC/IgH/CEN8t(8:14) not detected 04/18/2024 PET: Intense metabolic activity associated with the T9 lesion, no evidence of additional osseous involvement, no evidence of metastatic lymphadenopathy, normal spleen, new right pleural effusion-favored benign postsurgical   2.  Back pain secondary to a  destructive mass at T9 3.  Hyperlipidemia 4.  History of coronary artery disease, NSTEMI 2020, STEMI 2020 5.  Hypertension 6.  History of CHF 7.  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 Lynn Hogan has been diagnosed with large B-cell lymphoma involving a destructive mass at T9/T10.  She underwent a decompressive thoracic laminectomy 03/25/2024.  Back pain has improved.  She does not have neurologic symptoms.  I discussed the lymphoma diagnosis, prognosis, and treatment options with her.  She has a low risk IPI score and limited stage disease.  I recommend treatment with R-CHOP for 3 cycles followed by a restaging PET followed by response directed therapy.  We reviewed potential toxicities associated with the R-CHOP regimen including the chance of nausea/vomiting, mucositis, alopecia, hematologic toxicity, infection, and bleeding.  We discussed the vesicant property of doxorubicin and vincristine.  We discussed the neuropathy associated with vincristine and the cardiotoxicity associated with doxorubicin.  We discussed altered mental status, decreased bone density, and GI toxicity associated with prednisone.  We reviewed the allergic reaction, CNS toxicity, infection, and hematologic toxicity associated with rituximab.  She will receive G-CSF following chemotherapy.  We reviewed potential toxicities associated with G-CSF including the chance of bone pain, rash, and splenic rupture.  She agrees to proceed.  She will attend a chemotherapy teach class.  Ms. Folz will be referred for a baseline echocardiogram and Port-A-Cath placement.  She will complete cycle 1 R-CHOP on 04/28/2024.  Ms. Lobban will be scheduled for office visit and nadir CBC at day  10-12.  A treatment plan was entered today.    Arley Hof, MD  04/20/2024  1:34 PM

## 2024-04-20 NOTE — Progress Notes (Signed)
 PATIENT NAVIGATOR PROGRESS NOTE  Name: Lynn Hogan Date: 04/20/2024 MRN: [id number]  DOB: 18-Mar-1946   Reason for visit:  Hospital F/U visit  Comments:  Met with Ms Evitt and daughter [name] during visit with Dr [name]  Given information on RCHOP and they will attend Patient education on Friday 10/24 ECHO scheduled for 10/27 at [facility] at [facility] placement scheduled for Monday 10/27 also at [facility] at 12:00 arrival time Pretreatment labs will be drawn Friday during education appt Given contact number to call with any questions    Time spent counseling/coordinating care: > 60 minutes

## 2024-04-21 ENCOUNTER — Other Ambulatory Visit: Payer: Self-pay

## 2024-04-21 NOTE — Progress Notes (Signed)
 Pharmacist Chemotherapy Monitoring - Initial Assessment    Anticipated start date: 04/28/24   The following has been reviewed per standard work regarding the patient's treatment regimen: The patient's diagnosis, treatment plan and drug doses, and organ/hematologic function Lab orders and baseline tests specific to treatment regimen  The treatment plan start date, drug sequencing, and pre-medications Prior authorization status  Patient's documented medication list, including drug-drug interaction screen and prescriptions for anti-emetics and supportive care specific to the treatment regimen The drug concentrations, fluid compatibility, administration routes, and timing of the medications to be used The patient's access for treatment and lifetime cumulative dose history, if applicable  The patient's medication allergies and previous infusion related reactions, if applicable   Changes made to treatment plan:  N/A  Follow up needed:  F/u with Hep labs   Lynn Hogan, [facility], 04/21/2024  9:43 AM

## 2024-04-22 ENCOUNTER — Inpatient Hospital Stay

## 2024-04-22 ENCOUNTER — Other Ambulatory Visit: Payer: Self-pay

## 2024-04-22 ENCOUNTER — Other Ambulatory Visit: Payer: Self-pay | Admitting: Oncology

## 2024-04-22 ENCOUNTER — Other Ambulatory Visit: Payer: Self-pay | Admitting: *Deleted

## 2024-04-22 DIAGNOSIS — Z5111 Encounter for antineoplastic chemotherapy: Secondary | ICD-10-CM | POA: Diagnosis not present

## 2024-04-22 DIAGNOSIS — C8510 Unspecified B-cell lymphoma, unspecified site: Secondary | ICD-10-CM

## 2024-04-22 LAB — CBC WITH DIFFERENTIAL (CANCER CENTER ONLY)
Abs Immature Granulocytes: 0.1 K/uL — ABNORMAL HIGH (ref 0.00–0.07)
Basophils Absolute: 0 K/uL (ref 0.0–0.1)
Basophils Relative: 1 %
Eosinophils Absolute: 0.1 K/uL (ref 0.0–0.5)
Eosinophils Relative: 1 %
HCT: 33.4 % — ABNORMAL LOW (ref 36.0–46.0)
Hemoglobin: 10.3 g/dL — ABNORMAL LOW (ref 12.0–15.0)
Immature Granulocytes: 1 %
Lymphocytes Relative: 25 %
Lymphs Abs: 2.2 K/uL (ref 0.7–4.0)
MCH: 29.8 pg (ref 26.0–34.0)
MCHC: 30.8 g/dL (ref 30.0–36.0)
MCV: 96.5 fL (ref 80.0–100.0)
Monocytes Absolute: 0.7 K/uL (ref 0.1–1.0)
Monocytes Relative: 8 %
Neutro Abs: 5.6 K/uL (ref 1.7–7.7)
Neutrophils Relative %: 64 %
Platelet Count: 597 K/uL — ABNORMAL HIGH (ref 150–400)
RBC: 3.46 MIL/uL — ABNORMAL LOW (ref 3.87–5.11)
RDW: 15 % (ref 11.5–15.5)
WBC Count: 8.8 K/uL (ref 4.0–10.5)
nRBC: 0.6 % — ABNORMAL HIGH (ref 0.0–0.2)

## 2024-04-22 LAB — CMP (CANCER CENTER ONLY)
ALT: 21 U/L (ref 0–44)
AST: 30 U/L (ref 15–41)
Albumin: 3.4 g/dL — ABNORMAL LOW (ref 3.5–5.0)
Alkaline Phosphatase: 129 U/L — ABNORMAL HIGH (ref 38–126)
Anion gap: 12 (ref 5–15)
BUN: 13 mg/dL (ref 8–23)
CO2: 26 mmol/L (ref 22–32)
Calcium: 9.7 mg/dL (ref 8.9–10.3)
Chloride: 104 mmol/L (ref 98–111)
Creatinine: 0.56 mg/dL (ref 0.44–1.00)
GFR, Estimated: >60 mL/min (ref >60)
Glucose, Bld: 102 mg/dL — ABNORMAL HIGH (ref 70–99)
Potassium: 4.2 mmol/L (ref 3.5–5.1)
Sodium: 141 mmol/L (ref 135–145)
Total Bilirubin: 0.3 mg/dL (ref 0.0–1.2)
Total Protein: 7 g/dL (ref 6.5–8.1)

## 2024-04-22 LAB — LACTATE DEHYDROGENASE: LDH: 308 U/L — ABNORMAL HIGH (ref 98–192)

## 2024-04-22 LAB — URIC ACID: Uric Acid, Serum: 3.2 mg/dL (ref 2.5–7.1)

## 2024-04-22 MED ORDER — ONDANSETRON HCL 8 MG PO TABS: 8.0000 mg | ORAL_TABLET | Freq: Three times a day (TID) | ORAL | 2 refills | Status: AC | PRN

## 2024-04-22 MED ORDER — PROCHLORPERAZINE MALEATE 10 MG PO TABS: 10.0000 mg | ORAL_TABLET | Freq: Four times a day (QID) | ORAL | 2 refills | Status: AC | PRN

## 2024-04-22 MED ORDER — LIDOCAINE-PRILOCAINE 2.5-2.5 % EX CREA: 1.0000 | TOPICAL_CREAM | CUTANEOUS | 2 refills | Status: AC | PRN

## 2024-04-22 MED ORDER — PREDNISONE 20 MG PO TABS: 80.0000 mg | ORAL_TABLET | Freq: Every day | ORAL | 3 refills | Status: DC

## 2024-04-22 NOTE — Progress Notes (Signed)
 PATIENT NAVIGATOR PROGRESS NOTE  Name: Lynn Hogan Date: 04/22/2024 MRN: [id number]  DOB: 07-Dec-1945   Reason for visit:  Patient education  Comments:  Please see education note and distress screening tool    Time spent counseling/coordinating care: > 60 minutes

## 2024-04-23 ENCOUNTER — Inpatient Hospital Stay

## 2024-04-24 ENCOUNTER — Other Ambulatory Visit: Payer: Self-pay

## 2024-04-25 ENCOUNTER — Ambulatory Visit (HOSPITAL_COMMUNITY)
Admission: RE | Admit: 2024-04-25 | Discharge: 2024-04-25 | Disposition: A | Discharge status: Home / Self Care | Source: Ambulatory Visit | Attending: Oncology | Admitting: Oncology

## 2024-04-25 ENCOUNTER — Other Ambulatory Visit: Payer: Self-pay

## 2024-04-25 ENCOUNTER — Ambulatory Visit (HOSPITAL_COMMUNITY)
Admission: RE | Admit: 2024-04-25 | Discharge: 2024-04-25 | Disposition: A | Source: Ambulatory Visit | Attending: Oncology | Admitting: Oncology

## 2024-04-25 DIAGNOSIS — I5022 Chronic systolic (congestive) heart failure: Secondary | ICD-10-CM | POA: Insufficient documentation

## 2024-04-25 DIAGNOSIS — C8510 Unspecified B-cell lymphoma, unspecified site: Secondary | ICD-10-CM

## 2024-04-25 DIAGNOSIS — I2510 Atherosclerotic heart disease of native coronary artery without angina pectoris: Secondary | ICD-10-CM | POA: Diagnosis not present

## 2024-04-25 DIAGNOSIS — Z79899 Other long term (current) drug therapy: Secondary | ICD-10-CM | POA: Diagnosis not present

## 2024-04-25 DIAGNOSIS — I252 Old myocardial infarction: Secondary | ICD-10-CM | POA: Insufficient documentation

## 2024-04-25 DIAGNOSIS — Z955 Presence of coronary angioplasty implant and graft: Secondary | ICD-10-CM | POA: Diagnosis not present

## 2024-04-25 DIAGNOSIS — I340 Nonrheumatic mitral (valve) insufficiency: Secondary | ICD-10-CM | POA: Diagnosis not present

## 2024-04-25 DIAGNOSIS — Z0189 Encounter for other specified special examinations: Secondary | ICD-10-CM

## 2024-04-25 DIAGNOSIS — E785 Hyperlipidemia, unspecified: Secondary | ICD-10-CM | POA: Insufficient documentation

## 2024-04-25 HISTORY — PX: IR IMAGING GUIDED PORT INSERTION: IMG5740

## 2024-04-25 LAB — ECHOCARDIOGRAM COMPLETE
Area-P 1/2: 2.95 cm2
Calc EF: 72.3 %
S' Lateral: 2.9 cm
Single Plane A2C EF: 74.9 %
Single Plane A4C EF: 71.5 %

## 2024-04-25 MED ORDER — FENTANYL CITRATE (PF) 100 MCG/2ML IJ SOLN
INTRAMUSCULAR | Status: AC | PRN
  Administered 2024-04-25 (×2): 25 ug via INTRAVENOUS

## 2024-04-25 MED ORDER — HEPARIN SOD (PORK) LOCK FLUSH 100 UNIT/ML IV SOLN
INTRAVENOUS | Status: AC
Start: 2024-04-25 — End: 2024-04-25
  Filled 2024-04-25: qty 5

## 2024-04-25 MED ORDER — ACETAMINOPHEN 325 MG PO TABS
650.0000 mg | ORAL_TABLET | Freq: Once | ORAL | Status: AC
  Administered 2024-04-25: 650 mg via ORAL
  Filled 2024-04-25: qty 2

## 2024-04-25 MED ORDER — LIDOCAINE HCL 1 % IJ SOLN
INTRAMUSCULAR | Status: AC
  Filled 2024-04-25: qty 20

## 2024-04-25 MED ORDER — SODIUM CHLORIDE 0.9 % IV SOLN: INTRAVENOUS | Status: DC

## 2024-04-25 MED ORDER — LIDOCAINE-EPINEPHRINE 1 %-1:100000 IJ SOLN
INTRAMUSCULAR | Status: AC
Start: 2024-04-25 — End: 2024-04-25
  Filled 2024-04-25: qty 1

## 2024-04-25 MED ORDER — HEPARIN SOD (PORK) LOCK FLUSH 100 UNIT/ML IV SOLN
500.0000 [IU] | Freq: Once | INTRAVENOUS | Status: AC
  Administered 2024-04-25: 500 [IU] via INTRAVENOUS

## 2024-04-25 MED ORDER — MIDAZOLAM HCL 2 MG/2ML IJ SOLN
INTRAMUSCULAR | Status: AC
  Filled 2024-04-25: qty 4

## 2024-04-25 MED ORDER — LIDOCAINE HCL 1 % IJ SOLN
20.0000 mL | Freq: Once | INTRAMUSCULAR | Status: AC
  Administered 2024-04-25: 10 mL

## 2024-04-25 MED ORDER — LIDOCAINE-EPINEPHRINE 1 %-1:100000 IJ SOLN
20.0000 mL | Freq: Once | INTRAMUSCULAR | Status: AC
  Administered 2024-04-25: 20 mL via INTRADERMAL

## 2024-04-25 MED ORDER — MIDAZOLAM HCL (PF) 2 MG/2ML IJ SOLN
INTRAMUSCULAR | Status: AC | PRN
  Administered 2024-04-25 (×2): .5 mg via INTRAVENOUS
  Administered 2024-04-25: 1 mg via INTRAVENOUS
  Administered 2024-04-25: 1.5 mg via INTRAVENOUS

## 2024-04-25 MED ORDER — FENTANYL CITRATE (PF) 100 MCG/2ML IJ SOLN
INTRAMUSCULAR | Status: AC
  Filled 2024-04-25: qty 2

## 2024-04-25 NOTE — Procedures (Signed)
 Interventional Radiology Procedure:   Indications: Large B-cell lymphoma  Procedure: Port placement  Findings: Right jugular port, tip at SVC/RA junction.    Complications: None     EBL: Minimal, less than 10 ml  Plan: Discharge in one hour.  Keep port site and incisions dry for at least 24 hours.     Lynn Flewellen R. Philip, MD  Pager: [phone]

## 2024-04-25 NOTE — Progress Notes (Signed)
 Discharge instructions reviewed with patient and Daughter [name] at the bedside. Denies questions or concerns. No s/s of complications at the incision site. PT ambulated the bathroom was ale to void without difficulty. PT tolerated PO intake no complaints of n/v. PT escorted from the unit via wheel chair to personal vehicle.

## 2024-04-25 NOTE — H&P (Signed)
 Chief Complaint: Large B-cell lymphoma - IR consulted for port-a-catheter placement for chemotherapy  Referring Provider(s): [name] Arley NOVAK, MD  Supervising Physician: Philip Cornet  Patient Status: [facility] - Out-pt  History of Present Illness: Lynn Hogan is a 78 y.o. female with pmhx of CAD, HLD, STEMI s/p PCI to RCA 2020, systolic heart failure. She currently is following with Dr. [name] of oncology for large B cell lymphoma. She recently underwent thoracic laminectomy due to destructive mass at T9/T10. Plan is for initiation of R-CHOP for 3 cycles. IR has now been consulted for port-a-catheter placement to begin the chemotherapy.  Today patient with chronic back pain since surgery, although feeling less severe today. No new complaints. Has been NPO since midnight.   Patient is Full Code  Past Medical History:  Diagnosis Date   Coronary artery disease    Hyperlipidemia    NSTEMI (non-ST elevated myocardial infarction) (HCC)    08/02/2018-PCI/DES to RCA    STEMI (ST elevation myocardial infarction) (HCC)    08/08/2018-PCI/DES to mLAD, 85% osital stenosis of 1st diag.    Systolic heart failure [facility])     Past Surgical History:  Procedure Laterality Date   CARDIAC CATHETERIZATION     CATARACT EXTRACTION, BILATERAL  02/12/2016   CORONARY STENT INTERVENTION  08/02/2018   CORONARY STENT INTERVENTION N/A 08/02/2018   Procedure: CORONARY STENT INTERVENTION;  Surgeon: Mady Bruckner, MD;  Location: [facility] INVASIVE CV LAB;  Service: Cardiovascular;  Laterality: N/A;   CORONARY/GRAFT ACUTE MI REVASCULARIZATION N/A 08/08/2018   Procedure: Coronary/Graft Acute MI Revascularization;  Surgeon: Burnard Debby LABOR, MD;  Location: [facility] INVASIVE CV LAB;  Service: Cardiovascular;  Laterality: N/A;   HYSTERECTOMY ABDOMINAL WITH SALPINGECTOMY  02/12/1971   LEFT HEART CATH AND CORONARY ANGIOGRAPHY N/A 08/02/2018   Procedure: LEFT HEART CATH AND CORONARY ANGIOGRAPHY;  Surgeon: Mady Bruckner, MD;  Location:  [facility] INVASIVE CV LAB;  Service: Cardiovascular;  Laterality: N/A;   LEFT HEART CATH AND CORONARY ANGIOGRAPHY N/A 08/08/2018   Procedure: LEFT HEART CATH AND CORONARY ANGIOGRAPHY;  Surgeon: Burnard Debby LABOR, MD;  Location: [facility] INVASIVE CV LAB;  Service: Cardiovascular;  Laterality: N/A;    Allergies: Ibuprofen and Nsaids  Medications: Prior to Admission medications   Medication Sig Start Date End Date Taking? Authorizing Provider  acetaminophen  (TYLENOL ) 325 MG tablet Take 325 mg by mouth every 6 (six) hours as needed for mild pain (pain score 1-3).   Yes [provider]  aspirin  EC 81 MG EC tablet Take 1 tablet (81 mg total) by mouth daily. 08/03/18  Yes Bhagat, Bhavinkumar, PA  atorvastatin  (LIPITOR ) 40 MG tablet Take 1 tablet (40 mg total) by mouth daily. 08/09/21  Yes Burnard Debby LABOR, MD  Cholecalciferol (VITAMIN D3) 10 MCG (400 UNIT) tablet Take 400 Units by mouth every evening.   Yes [provider]  ezetimibe  (ZETIA ) 10 MG tablet TAKE 1 TABLET EVERY DAY (NEED MD APPOINTMENT FOR REFILLS) 12/25/23  Yes Burnard Debby LABOR, MD  metoprolol  succinate (TOPROL -XL) 25 MG 24 hr tablet Take 1 tablet (25 mg total) by mouth daily. TAKE 1 TABLET EVERY DAY WITH OR IMMEDIATELY FOLLOWING A MEAL. Please call [phone] to schedule an overdue appointment for future refills. Thank you. 04/09/24  Yes Patsy Lenis, MD  diclofenac  Sodium (VOLTAREN ) 1 % GEL Apply 4 g topically 4 (four) times daily. Patient not taking: Reported on 04/20/2024 01/13/24   Emil Share, DO  lidocaine -prilocaine (EMLA) cream Apply 1 Application topically as needed (Apply 1-2 hours prior  to port use). 04/22/24   [name] Arley NOVAK, MD  nitroGLYCERIN  (NITROSTAT ) 0.4 MG SL tablet Place 1 tablet (0.4 mg total) under the tongue every 5 (five) minutes as needed for chest pain. Patient not taking: Reported on 04/20/2024 05/21/20 07/18/21  Meng, Hao, PA  ondansetron  (ZOFRAN ) 8 MG tablet Take 1 tablet (8 mg total) by mouth every 8 (eight)  hours as needed (Start 3 days after chemo as needed for nausea). 04/22/24   [name] Arley NOVAK, MD  ondansetron  (ZOFRAN -ODT) 4 MG disintegrating tablet Take 1 tablet (4 mg total) by mouth every 8 (eight) hours as needed for nausea or vomiting. Patient not taking: Reported on 04/20/2024 04/09/24   Patsy Lenis, MD  predniSONE (DELTASONE) 20 MG tablet Take 4 tablets (80 mg total) by mouth daily with breakfast. Starting day after each chemo cycle for 4 days 04/29/24   [name] Arley NOVAK, MD  prochlorperazine (COMPAZINE) 10 MG tablet Take 1 tablet (10 mg total) by mouth every 6 (six) hours as needed for nausea or vomiting. 04/22/24   [name] Arley NOVAK, MD     Family History  Problem Relation Age of Onset   Microcephaly Mother    Brain cancer Mother    Lung cancer Mother    Heart attack Father 77    Social History   Socioeconomic History   Marital status: Single    Spouse name: Not on file   Number of children: Not on file   Years of education: Not on file   Highest education level: Not on file  Occupational History   Not on file  Tobacco Use   Smoking status: Never   Smokeless tobacco: Never  Vaping Use   Vaping status: Never Used  Substance and Sexual Activity   Alcohol use: Never   Drug use: Never   Sexual activity: Not Currently  Other Topics Concern   Not on file  Social History Narrative   Not on file   Social Drivers of Health   Financial Resource Strain: Low Risk  (08/01/2018)   Overall Financial Resource Strain (CARDIA)    Difficulty of Paying Living Expenses: Not hard at all  Food Insecurity: No Food Insecurity (03/22/2024)   Hunger Vital Sign    Worried About Running Out of Food in the Last Year: Never true    Ran Out of Food in the Last Year: Never true  Transportation Needs: No Transportation Needs (03/22/2024)   PRAPARE - Administrator, Civil Service (Medical): No    Lack of Transportation (Non-Medical): No  Physical Activity: Sufficiently Active  (08/01/2018)   Exercise Vital Sign    Days of Exercise per Week: 7 days    Minutes of Exercise per Session: 30 min  Stress: No Stress Concern Present (08/01/2018)   [employer] of Occupational Health - Occupational Stress Questionnaire    Feeling of Stress : Only a little  Social Connections: Socially Isolated (03/22/2024)   Social Connection and Isolation Panel    Frequency of Communication with Friends and Family: More than three times a week    Frequency of Social Gatherings with Friends and Family: Three times a week    Attends Religious Services: Never    Active Member of Clubs or Organizations: No    Attends Banker Meetings: Never    Marital Status: Widowed     Review of Systems: A 12 point ROS discussed and pertinent positives are indicated in the HPI above.  All other systems are negative.  Vital Signs: BP (!) 171/69   Pulse 61   Temp 98.2 F (36.8 C) (Oral)   Resp 17   Ht 5' 1 (1.549 m)   Wt 172 lb (78 kg)   SpO2 93%   BMI 32.50 kg/m   Advance Care Plan: No documents on file  Physical Exam Vitals and nursing note reviewed.  Constitutional:      Appearance: Normal appearance.  HENT:     Mouth/Throat:     Mouth: Mucous membranes are moist.     Pharynx: Oropharynx is clear.  Cardiovascular:     Rate and Rhythm: Normal rate and regular rhythm.  Pulmonary:     Effort: Pulmonary effort is normal.     Breath sounds: Normal breath sounds.  Abdominal:     Palpations: Abdomen is soft.     Tenderness: There is no abdominal tenderness.  Musculoskeletal:     Right lower leg: No edema.     Left lower leg: No edema.  Skin:    General: Skin is warm and dry.  Neurological:     Mental Status: She is alert and oriented to person, place, and time. Mental status is at baseline.     Imaging: NM PET Image Initial (PI) Skull Base To Thigh (F-18 FDG) Result Date: 04/18/2024 EXAM: PET AND CT SKULL BASE TO MID THIGH 04/18/2024 01:21:10 PM TECHNIQUE:  RADIOPHARMACEUTICAL: 8.88 mCi F-18 FDG Uptake time 60 minutes. Glucose level 105 mg/dl. PET imaging was acquired from the base of the skull to the mid thighs. Non-contrast enhanced computed tomography was obtained for attenuation correction and anatomic localization. COMPARISON: CT 03/22/24 CLINICAL HISTORY: Hematologic malignancy, staging. 8.88 mCi F18 FDG IV RAC @ 1206 MM; BG - 105; dose and pt verified w/ CD; PET Scan for large B-cell lymphoma; hematologic malignancy, staging; Not diabetic; back surgery, rods and pins placed two weeks ago; no biopsies; no therapies or treatments; no OBMD; EOV FINDINGS: HEAD AND NECK: No metabolically active cervical lymphadenopathy. CHEST: There is a moderate layering right pleural effusion which is new from comparison exam. This is favored benign postsurgical. No metabolically active pulmonary nodules. No metabolically active mediastinal lymphadenopathy. ABDOMEN AND PELVIS: No metabolically active intraperitoneal mass. No metabolically active abdominal or pelvic lymph nodes. The spleen is normal volume and normal metabolic activity. Physiologic activity within the gastrointestinal and genitourinary systems. BONES AND SOFT TISSUE: Intense hypermetabolic activity is associated with the T9 anterior bony vertebral body, with SUV max equal to 22.3 on image 86. There is an aggressive soft tissue lesion with bone destruction (image 8). This is consistent with the given history of lymphoma. There is interval posterior thoracic fusion superior and inferior to the T9 vertebral body. No evidence of additional sites of lymphoma bone involvement. No evidence of lymphoma other than the T9 vertebral lesion. IMPRESSION: 1. Intense metabolic activity associated with the aggressive T9 lesion consistent with lymphoma, with interval posterior thoracic fusion above and below T9. 2. No evidence of additional osseous lymphoma involvement. 3. No evidence of metastatic lymphadenopathy. Normal spleen. No  evidence of lymphoma elsewhere. 4. New moderate right pleural effusion, favored benign postsurgical. Electronically signed by: Norleen Boxer MD 04/18/2024 03:10 PM EDT RP Workstation: [id number]    Labs:  CBC: Recent Labs    03/26/24 0439 03/30/24 0837 04/05/24 0512 04/22/24 1058  WBC 10.1 10.2 10.0 8.8  HGB 12.0 11.8* 10.8* 10.3*  HCT 36.9 36.1 33.8* 33.4*  PLT 383 363 308 597*    COAGS: No results for input(s): INR,  APTT in the last 8760 hours.  BMP: Recent Labs    03/26/24 0439 03/30/24 0837 04/05/24 0512 04/22/24 1058  NA 136 136 135 141  K 4.0 3.9 3.7 4.2  CL 105 101 103 104  CO2 23 27 24 26   GLUCOSE 153* 99 116* 102*  BUN 11 13 12 13   CALCIUM  8.6* 8.7* 8.4* 9.7  CREATININE 0.84 0.62 0.62 0.56  GFRNONAA >60 >60 >60 >60    LIVER FUNCTION TESTS: Recent Labs    01/13/24 2021 03/22/24 0443 04/05/24 0512 04/22/24 1058  BILITOT 0.4 0.5 0.6 0.3  AST 25 22 24 30   ALT 13 14 13 21   ALKPHOS 67 53 66 129*  PROT 7.1 6.4* 5.5* 7.0  ALBUMIN 4.2 3.2* 2.4* 3.4*    TUMOR MARKERS: No results for input(s): AFPTM, CEA, CA199, CHROMGRNA in the last 8760 hours.  Assessment and Plan:  Lynn Hogan is a 78 y.o. female with pmhx of CAD, HLD, STEMI s/p PCI to RCA 2020, systolic heart failure. She currently is following with Dr. [name] of oncology for large B cell lymphoma. She recently underwent thoracic laminectomy due to destructive mass at T9/T10. Plan is for initiation of R-CHOP for 3 cycles. IR has now been consulted for port-a-catheter placement to begin the chemotherapy.  Plan for port-a-catheter placement today to initiate chemotherapy for large B cell lymphoma NPO since midnight  Risks and benefits of image-guided Port-a-catheter placement were discussed with the patient including, but not limited to bleeding, infection, pneumothorax, or fibrin sheath development and need for additional procedures. All of the patient's questions were answered,  patient is agreeable to proceed. Consent signed and in chart.   Thank you for allowing our service to participate in BEOLA VASALLO 's care.  Electronically Signed: Kimble VEAR Clas, PA-C   04/25/2024, 12:51 PM      I spent a total of  30 Minutes   in face to face in clinical consultation, greater than 50% of which was counseling/coordinating care for port-a-catheter placement

## 2024-04-28 ENCOUNTER — Other Ambulatory Visit: Payer: Self-pay | Admitting: Oncology

## 2024-04-28 ENCOUNTER — Inpatient Hospital Stay

## 2024-04-28 VITALS — BP 146/61 | HR 65 | Temp 98.2°F | Resp 18 | Ht 61.0 in | Wt 174.0 lb

## 2024-04-28 DIAGNOSIS — C8510 Unspecified B-cell lymphoma, unspecified site: Secondary | ICD-10-CM

## 2024-04-28 DIAGNOSIS — Z5111 Encounter for antineoplastic chemotherapy: Secondary | ICD-10-CM | POA: Diagnosis not present

## 2024-04-28 LAB — HEPATITIS B SURFACE ANTIGEN: Hepatitis B Surface Ag: NONREACTIVE

## 2024-04-28 MED ORDER — DIPHENHYDRAMINE HCL 25 MG PO CAPS
50.0000 mg | ORAL_CAPSULE | Freq: Once | ORAL | Status: AC
  Administered 2024-04-28: 50 mg via ORAL
  Filled 2024-04-28: qty 2

## 2024-04-28 MED ORDER — DOXORUBICIN HCL CHEMO IV INJECTION 2 MG/ML
50.0000 mg/m2 | Freq: Once | INTRAVENOUS | Status: AC
  Administered 2024-04-28: 92 mg via INTRAVENOUS
  Filled 2024-04-28: qty 46

## 2024-04-28 MED ORDER — ACETAMINOPHEN 325 MG PO TABS
650.0000 mg | ORAL_TABLET | Freq: Once | ORAL | Status: AC
  Administered 2024-04-28: 650 mg via ORAL
  Filled 2024-04-28: qty 2

## 2024-04-28 MED ORDER — SODIUM CHLORIDE 0.9 % IV SOLN
375.0000 mg/m2 | Freq: Once | INTRAVENOUS | Status: DC
  Filled 2024-04-28: qty 70

## 2024-04-28 MED ORDER — PALONOSETRON HCL INJECTION 0.25 MG/5ML
0.2500 mg | Freq: Once | INTRAVENOUS | Status: AC
  Administered 2024-04-28: 0.25 mg via INTRAVENOUS
  Filled 2024-04-28: qty 5

## 2024-04-28 MED ORDER — DEXAMETHASONE SOD PHOSPHATE PF 10 MG/ML IJ SOLN
10.0000 mg | Freq: Once | INTRAMUSCULAR | Status: AC
  Administered 2024-04-28: 10 mg via INTRAVENOUS

## 2024-04-28 MED ORDER — VINCRISTINE SULFATE CHEMO INJECTION 1 MG/ML
1.0000 mg | Freq: Once | INTRAVENOUS | Status: AC
  Administered 2024-04-28: 1 mg via INTRAVENOUS
  Filled 2024-04-28: qty 1

## 2024-04-28 MED ORDER — SODIUM CHLORIDE 0.9 % IV SOLN
375.0000 mg/m2 | Freq: Once | INTRAVENOUS | Status: AC
  Administered 2024-04-28: 700 mg via INTRAVENOUS
  Filled 2024-04-28: qty 50

## 2024-04-28 MED ORDER — APREPITANT 130 MG/18ML IV EMUL
130.0000 mg | Freq: Once | INTRAVENOUS | Status: AC
  Administered 2024-04-28: 130 mg via INTRAVENOUS
  Filled 2024-04-28: qty 18

## 2024-04-28 MED ORDER — SODIUM CHLORIDE 0.9 % IV SOLN: INTRAVENOUS | Status: DC

## 2024-04-28 MED ORDER — SODIUM CHLORIDE 0.9 % IV SOLN
750.0000 mg/m2 | Freq: Once | INTRAVENOUS | Status: AC
  Administered 2024-04-28: 1500 mg via INTRAVENOUS
  Filled 2024-04-28: qty 50

## 2024-04-28 NOTE — Patient Instructions (Addendum)
 [facility] - A DEPT OF [facility]  Discharge Instructions: Thank you for choosing [facility] to provide your oncology and hematology care.   If you have a lab appointment with the [facility], please go directly to the [facility] and check in at the registration area.   Wear comfortable clothing and clothing appropriate for easy access to any Portacath or PICC line.   We strive to give you quality time with your provider. You may need to reschedule your appointment if you arrive late (15 or more minutes).  Arriving late affects you and other patients whose appointments are after yours.  Also, if you miss three or more appointments without notifying the office, you may be dismissed from the clinic at the provider's discretion.      For prescription refill requests, have your pharmacy contact our office and allow 72 hours for refills to be completed.    Today you received the following chemotherapy and/or immunotherapy agents: Adriamycin, Vincristine, cyclophosphamide and Rituxan.      To help prevent nausea and vomiting after your treatment, we encourage you to take your nausea medication as directed.  BELOW ARE SYMPTOMS THAT SHOULD BE REPORTED IMMEDIATELY: *FEVER GREATER THAN 100.4 F (38 C) OR HIGHER *CHILLS OR SWEATING *NAUSEA AND VOMITING THAT IS NOT CONTROLLED WITH YOUR NAUSEA MEDICATION *UNUSUAL SHORTNESS OF BREATH *UNUSUAL BRUISING OR BLEEDING *URINARY PROBLEMS (pain or burning when urinating, or frequent urination) *BOWEL PROBLEMS (unusual diarrhea, constipation, pain near the anus) TENDERNESS IN MOUTH AND THROAT WITH OR WITHOUT PRESENCE OF ULCERS (sore throat, sores in mouth, or a toothache) UNUSUAL RASH, SWELLING OR PAIN  UNUSUAL VAGINAL DISCHARGE OR ITCHING   Items with * indicate a potential emergency and should be followed up as soon as possible or go to the Emergency Department if any problems should occur.  Please show  the CHEMOTHERAPY ALERT CARD or IMMUNOTHERAPY ALERT CARD at check-in to the Emergency Department and triage nurse.  Should you have questions after your visit or need to cancel or reschedule your appointment, please contact [facility] - A DEPT OF [facility]  Dept: [phone]  and follow the prompts.  Office hours are 8:00 a.m. to 4:30 p.m. Monday - Friday. Please note that voicemails left after 4:00 p.m. may not be returned until the following business day.  We are closed weekends and major holidays. You have access to a nurse at all times for urgent questions. Please call the main number to the clinic Dept: [phone] and follow the prompts.   For any non-urgent questions, you may also contact your provider using MyChart. We now offer e-Visits for anyone 65 and older to request care online for non-urgent symptoms. For details visit [email or web address].   Also download the MyChart app! Go to the app store, search MyChart, open the app, select [facility], and log in with your MyChart username and password. ___________________________________________________________  Doxorubicin Injection What is this medication? DOXORUBICIN (dox oh ROO bi sin) treats some types of cancer. It works by slowing down the growth of cancer cells. This medicine may be used for other purposes; ask your health care provider or pharmacist if you have questions. COMMON BRAND NAME(S): Adriamycin, Adriamycin PFS, Adriamycin RDF, Rubex What should I tell my care team before I take this medication? They need to know if you have any of these conditions: Heart disease History of low blood cell levels caused by a medication Liver  disease Recent or ongoing radiation An unusual or allergic reaction to doxorubicin, other medications, foods, dyes, or preservatives If you or your partner are pregnant or trying to get pregnant Breast-feeding How should I use this medication? This  medication is injected into a vein. It is given by your care team in a hospital or clinic setting. Talk to your care team about the use of this medication in children. Special care may be needed. Overdosage: If you think you have taken too much of this medicine contact a poison control [facility] or emergency room at once. NOTE: This medicine is only for you. Do not share this medicine with others. What if I miss a dose? Keep appointments for follow-up doses. It is important not to miss your dose. Call your care team if you are unable to keep an appointment. What may interact with this medication? 6-mercaptopurine Paclitaxel Phenytoin St. John's wort Trastuzumab Verapamil  This list may not describe all possible interactions. Give your health care provider a list of all the medicines, herbs, non-prescription drugs, or dietary supplements you use. Also tell them if you smoke, drink alcohol, or use illegal drugs. Some items may interact with your medicine. What should I watch for while using this medication? Your condition will be monitored carefully while you are receiving this medication. You may need blood work while taking this medication. This medication may make you feel generally unwell. This is not uncommon as chemotherapy can affect healthy cells as well as cancer cells. Report any side effects. Continue your course of treatment even though you feel ill unless your care team tells you to stop. There is a maximum amount of this medication you should receive throughout your life. The amount depends on the medical condition being treated and your overall health. Your care team will watch how much of this medication you receive. Tell your care team if you have taken this medication before. Your urine may turn red for a few days after your dose. This is not blood. If your urine is dark or brown, call your care team. In some cases, you may be given additional medications to help with side effects.  Follow all directions for their use. This medication may increase your risk of getting an infection. Call your care team for advice if you get a fever, chills, sore throat, or other symptoms of a cold or flu. Do not treat yourself. Try to avoid being around people who are sick. This medication may increase your risk to bruise or bleed. Call your care team if you notice any unusual bleeding. Talk to your care team about your risk of cancer. You may be more at risk for certain types of cancers if you take this medication. Talk to your care team if you or your partner may be pregnant. Serious birth defects can occur if you take this medication during pregnancy and for 6 months after the last dose. Contraception is recommended while taking this medication and for 6 months after the last dose. Your care team can help you find the option that works for you. If your partner can get pregnant, use a condom while taking this medication and for 6 months after the last dose. Do not breastfeed while taking this medication. This medication may cause infertility. Talk to your care team if you are concerned about your fertility. What side effects may I notice from receiving this medication? Side effects that you should report to your care team as soon as possible: Allergic reactions--skin rash,  itching, hives, swelling of the face, lips, tongue, or throat Heart failure--shortness of breath, swelling of the ankles, feet, or hands, sudden weight gain, unusual weakness or fatigue Heart rhythm changes--fast or irregular heartbeat, dizziness, feeling faint or lightheaded, chest pain, trouble breathing Infection--fever, chills, cough, sore throat, wounds that don't heal, pain or trouble when passing urine, general feeling of discomfort or being unwell Low red blood cell level--unusual weakness or fatigue, dizziness, headache, trouble breathing Painful swelling, warmth, or redness of the skin, blisters or sores at the  infusion site Unusual bruising or bleeding Side effects that usually do not require medical attention (report to your care team if they continue or are bothersome): Diarrhea Hair loss Nausea Pain, redness, or swelling with sores inside the mouth or throat Red urine This list may not describe all possible side effects. Call your doctor for medical advice about side effects. You may report side effects to FDA at 1-800-FDA-1088. Where should I keep my medication? This medication is given in a hospital or clinic. It will not be stored at home. NOTE: This sheet is a summary. It may not cover all possible information. If you have questions about this medicine, talk to your doctor, pharmacist, or health care provider.  2024 Elsevier/Gold Standard (2022-09-18 00:00:00) ____________________________________________________  Vincristine Injection What is this medication? VINCRISTINE (vin KRIS teen) treats some types of cancer. It works by slowing down the growth of cancer cells. This medicine may be used for other purposes; ask your health care provider or pharmacist if you have questions. COMMON BRAND NAME(S): Oncovin, Vincasar PFS What should I tell my care team before I take this medication? They need to know if you have any of these conditions: Infection Kidney disease Liver disease Low white blood cell levels Lung disease Nervous system disease, such as Charcot-Marie-Tooth (CMT) Recent or ongoing radiation therapy An unusual or allergic reaction to vincristine, other chemotherapy agents, other medications, foods, dyes, or preservatives Pregnant or trying to get pregnant Breast-feeding How should I use this medication? This medication is infused into a vein. It is given by your care team in a hospital or clinic setting. Talk to your care team about the use of this medication in children. While it may be given to children for selected conditions, precautions do apply. Overdosage: If you  think you have taken too much of this medicine contact a poison control [facility] or emergency room at once. NOTE: This medicine is only for you. Do not share this medicine with others. What if I miss a dose? Keep appointments for follow-up doses. It is important not to miss your dose. Call your care team if you are unable to keep an appointment. What may interact with this medication? Do not take this medication with any of the following: Live virus vaccines This medication may also interact with the following: Medications for fungal infections, such as itraconazole or fluconazole Phenytoin Supplements, such as St. John's wort This list may not describe all possible interactions. Give your health care provider a list of all the medicines, herbs, non-prescription drugs, or dietary supplements you use. Also tell them if you smoke, drink alcohol, or use illegal drugs. Some items may interact with your medicine. What should I watch for while using this medication? Your condition will be monitored carefully while you are receiving this medication. This medication may make you feel generally unwell. This is not uncommon as chemotherapy can affect healthy cells as well as cancer cells. Report any side effects. Continue your course  of treatment even though you feel ill unless your care team tells you to stop. You may need blood work while taking this medication. This medication may increase your risk to bruise or bleed. Call your care team if you notice any unusual bleeding. This medication may increase your risk of getting an infection. Call your care team for advice if you get a fever, chills, sore throat, or other symptoms of a cold or flu. Do not treat yourself. Try to avoid being around people who are sick. This medication will cause constipation. If you do not have a bowel movement for 3 days, call your care team. Call your care team if you are around anyone with measles, chickenpox, or if you develop  sores or blisters that do not heal properly. Be careful brushing or flossing your teeth or using a toothpick because you may get an infection or bleed more easily. If you have any dental work done, tell your dentist you are receiving this medication Talk to your care team if you or your partner wish to become pregnant or think either of you might be pregnant. This medication can cause serious birth defects. This medication may cause infertility. Talk to your care team if you are concerned about your fertility. Talk to your care team before breastfeeding. Changes to your treatment plan may be needed. What side effects may I notice from receiving this medication? Side effects that you should report to your care team as soon as possible: Allergic reactions--skin rash, itching, hives, swelling of the face, lips, tongue, or throat High uric acid level--severe pain, redness, warmth, or swelling in joints, pain or trouble passing urine, pain in the lower back or sides Infection--fever, chills, cough, sore throat, wounds that don't heal, pain or trouble when passing urine, general feeling of discomfort or being unwell Pain, tingling, or numbness in the hands or feet, muscle weakness, change in vision, confusion or trouble speaking, loss of balance or coordination, trouble walking, seizures Painful swelling, warmth, or redness of the skin, blisters or sores at the infusion site Shortness of breath or trouble breathing Side effects that usually do not require medical attention (report to your care team if they continue or are bothersome): Constipation Diarrhea Hair loss Loss of appetite Nausea Stomach cramping Vomiting This list may not describe all possible side effects. Call your doctor for medical advice about side effects. You may report side effects to FDA at 1-800-FDA-1088. Where should I keep my medication? This medication is given in a hospital or clinic. It will not be stored at home. NOTE:  This sheet is a summary. It may not cover all possible information. If you have questions about this medicine, talk to your doctor, pharmacist, or health care provider.  2024 Elsevier/Gold Standard (2021-09-10 00:00:00) ________________________________________________  Cyclophosphamide Injection What is this medication? CYCLOPHOSPHAMIDE (sye kloe FOSS fa mide) treats some types of cancer. It works by slowing down the growth of cancer cells. This medicine may be used for other purposes; ask your health care provider or pharmacist if you have questions. COMMON BRAND NAME(S): Cyclophosphamide, Cytoxan, Neosar What should I tell my care team before I take this medication? They need to know if you have any of these conditions: Heart disease Irregular heartbeat or rhythm Infection Kidney problems Liver disease Low blood cell levels (white cells, platelets, or red blood cells) Lung disease Previous radiation Trouble passing urine An unusual or allergic reaction to cyclophosphamide, other medications, foods, dyes, or preservatives Pregnant or trying to get  pregnant Breast-feeding How should I use this medication? This medication is injected into a vein. It is given by your care team in a hospital or clinic setting. Talk to your care team about the use of this medication in children. Special care may be needed. Overdosage: If you think you have taken too much of this medicine contact a poison control [facility] or emergency room at once. NOTE: This medicine is only for you. Do not share this medicine with others. What if I miss a dose? Keep appointments for follow-up doses. It is important not to miss your dose. Call your care team if you are unable to keep an appointment. What may interact with this medication? Amphotericin B Amiodarone Azathioprine Certain antivirals for HIV or hepatitis Certain medications for blood pressure, such as enalapril, lisinopril ,  quinapril Cyclosporine Diuretics Etanercept Indomethacin Medications that relax muscles Metronidazole Natalizumab Tamoxifen Warfarin This list may not describe all possible interactions. Give your health care provider a list of all the medicines, herbs, non-prescription drugs, or dietary supplements you use. Also tell them if you smoke, drink alcohol, or use illegal drugs. Some items may interact with your medicine. What should I watch for while using this medication? This medication may make you feel generally unwell. This is not uncommon as chemotherapy can affect healthy cells as well as cancer cells. Report any side effects. Continue your course of treatment even though you feel ill unless your care team tells you to stop. You may need blood work while you are taking this medication. This medication may increase your risk of getting an infection. Call your care team for advice if you get a fever, chills, sore throat, or other symptoms of a cold or flu. Do not treat yourself. Try to avoid being around people who are sick. Avoid taking medications that contain aspirin , acetaminophen , ibuprofen, naproxen, or ketoprofen unless instructed by your care team. These medications may hide a fever. Be careful brushing or flossing your teeth or using a toothpick because you may get an infection or bleed more easily. If you have any dental work done, tell your dentist you are receiving this medication. Drink water or other fluids as directed. Urinate often, even at night. Some products may contain alcohol. Ask your care team if this medication contains alcohol. Be sure to tell all care teams you are taking this medicine. Certain medicines, like metronidazole and disulfiram, can cause an unpleasant reaction when taken with alcohol. The reaction includes flushing, headache, nausea, vomiting, sweating, and increased thirst. The reaction can last from 30 minutes to several hours. Talk to your care team if you  wish to become pregnant or think you might be pregnant. This medication can cause serious birth defects if taken during pregnancy and for 1 year after the last dose. A negative pregnancy test is required before starting this medication. A reliable form of contraception is recommended while taking this medication and for 1 year after the last dose. Talk to your care team about reliable forms of contraception. Do not father a child while taking this medication and for 4 months after the last dose. Use a condom during this time period. Do not breast-feed while taking this medication or for 1 week after the last dose. This medication may cause infertility. Talk to your care team if you are concerned about your fertility. Talk to your care team about your risk of cancer. You may be more at risk for certain types of cancer if you take this medication. What  side effects may I notice from receiving this medication? Side effects that you should report to your care team as soon as possible: Allergic reactions--skin rash, itching, hives, swelling of the face, lips, tongue, or throat Dry cough, shortness of breath or trouble breathing Heart failure--shortness of breath, swelling of the ankles, feet, or hands, sudden weight gain, unusual weakness or fatigue Heart muscle inflammation--unusual weakness or fatigue, shortness of breath, chest pain, fast or irregular heartbeat, dizziness, swelling of the ankles, feet, or hands Heart rhythm changes--fast or irregular heartbeat, dizziness, feeling faint or lightheaded, chest pain, trouble breathing Infection--fever, chills, cough, sore throat, wounds that don't heal, pain or trouble when passing urine, general feeling of discomfort or being unwell Kidney injury--decrease in the amount of urine, swelling of the ankles, hands, or feet Liver injury--right upper belly pain, loss of appetite, nausea, light-colored stool, dark yellow or brown urine, yellowing skin or eyes,  unusual weakness or fatigue Low red blood cell level--unusual weakness or fatigue, dizziness, headache, trouble breathing Low sodium level--muscle weakness, fatigue, dizziness, headache, confusion Red or dark brown urine Unusual bruising or bleeding Side effects that usually do not require medical attention (report to your care team if they continue or are bothersome): Hair loss Irregular menstrual cycles or spotting Loss of appetite Nausea Pain, redness, or swelling with sores inside the mouth or throat Vomiting This list may not describe all possible side effects. Call your doctor for medical advice about side effects. You may report side effects to FDA at 1-800-FDA-1088. Where should I keep my medication? This medication is given in a hospital or clinic. It will not be stored at home. NOTE: This sheet is a summary. It may not cover all possible information. If you have questions about this medicine, talk to your doctor, pharmacist, or health care provider.  2024 Elsevier/Gold Standard (2021-11-01 00:00:00) ______________________________________________  Rituximab Injection What is this medication? RITUXIMAB (ri TUX i mab) treats leukemia and lymphoma. It works by blocking a protein that causes cancer cells to grow and multiply. This helps to slow or stop the spread of cancer cells. It may also be used to treat autoimmune conditions, such as arthritis. It works by slowing down an overactive immune system. It is a monoclonal antibody. This medicine may be used for other purposes; ask your health care provider or pharmacist if you have questions. COMMON BRAND NAME(S): RIABNI, Rituxan, RUXIENCE, truxima What should I tell my care team before I take this medication? They need to know if you have any of these conditions: Chest pain Heart disease Immune system problems Infection, such as chickenpox, cold sores, hepatitis B, herpes Irregular heartbeat or rhythm Kidney disease Low blood  counts, such as low white cells, platelets, red cells Lung disease Recent or upcoming vaccine An unusual or allergic reaction to rituximab, other medications, foods, dyes, or preservatives Pregnant or trying to get pregnant Breast-feeding How should I use this medication? This medication is injected into a vein. It is given by a care team in a hospital or clinic setting. A special MedGuide will be given to you before each treatment. Be sure to read this information carefully each time. Talk to your care team about the use of this medication in children. While this medication may be prescribed for children as young as 6 months for selected conditions, precautions do apply. Overdosage: If you think you have taken too much of this medicine contact a poison control [facility] or emergency room at once. NOTE: This medicine is only for  you. Do not share this medicine with others. What if I miss a dose? Keep appointments for follow-up doses. It is important not to miss your dose. Call your care team if you are unable to keep an appointment. What may interact with this medication? Do not take this medication with any of the following: Live vaccines This medication may also interact with the following: Cisplatin This list may not describe all possible interactions. Give your health care provider a list of all the medicines, herbs, non-prescription drugs, or dietary supplements you use. Also tell them if you smoke, drink alcohol, or use illegal drugs. Some items may interact with your medicine. What should I watch for while using this medication? Your condition will be monitored carefully while you are receiving this medication. You may need blood work while taking this medication. This medication can cause serious infusion reactions. To reduce the risk your care team may give you other medications to take before receiving this one. Be sure to follow the directions from your care team. This medication may  increase your risk of getting an infection. Call your care team for advice if you get a fever, chills, sore throat, or other symptoms of a cold or flu. Do not treat yourself. Try to avoid being around people who are sick. Call your care team if you are around anyone with measles, chickenpox, or if you develop sores or blisters that do not heal properly. Avoid taking medications that contain aspirin , acetaminophen , ibuprofen, naproxen, or ketoprofen unless instructed by your care team. These medications may hide a fever. This medication may cause serious skin reactions. They can happen weeks to months after starting the medication. Contact your care team right away if you notice fevers or flu-like symptoms with a rash. The rash may be red or purple and then turn into blisters or peeling of the skin. You may also notice a red rash with swelling of the face, lips, or lymph nodes in your neck or under your arms. In some patients, this medication may cause a serious brain infection that may cause death. If you have any problems seeing, thinking, speaking, walking, or standing, tell your care team right away. If you cannot reach your care team, urgently seek another source of medical care. Talk to your care team if you may be pregnant. Serious birth defects can occur if you take this medication during pregnancy and for 12 months after the last dose. You will need a negative pregnancy test before starting this medication. Contraception is recommended while taking this medication and for 12 months after the last dose. Your care team can help you find the option that works for you. Do not breastfeed while taking this medication and for at least 6 months after the last dose. What side effects may I notice from receiving this medication? Side effects that you should report to your care team as soon as possible: Allergic reactions or angioedema--skin rash, itching or hives, swelling of the face, eyes, lips, tongue,  arms, or legs, trouble swallowing or breathing Bowel blockage--stomach cramping, unable to have a bowel movement or pass gas, loss of appetite, vomiting Dizziness, loss of balance or coordination, confusion or trouble speaking Heart attack--pain or tightness in the chest, shoulders, arms, or jaw, nausea, shortness of breath, cold or clammy skin, feeling faint or lightheaded Heart rhythm changes--fast or irregular heartbeat, dizziness, feeling faint or lightheaded, chest pain, trouble breathing Infection--fever, chills, cough, sore throat, wounds that don't heal, pain or trouble when passing  urine, general feeling of discomfort or being unwell Infusion reactions--chest pain, shortness of breath or trouble breathing, feeling faint or lightheaded Kidney injury--decrease in the amount of urine, swelling of the ankles, hands, or feet Liver injury--right upper belly pain, loss of appetite, nausea, light-colored stool, dark yellow or brown urine, yellowing skin or eyes, unusual weakness or fatigue Redness, blistering, peeling, or loosening of the skin, including inside the mouth Stomach pain that is severe, does not go away, or gets worse Tumor lysis syndrome (TLS)--nausea, vomiting, diarrhea, decrease in the amount of urine, dark urine, unusual weakness or fatigue, confusion, muscle pain or cramps, fast or irregular heartbeat, joint pain Side effects that usually do not require medical attention (report to your care team if they continue or are bothersome): Headache Joint pain Nausea Runny or stuffy nose Unusual weakness or fatigue This list may not describe all possible side effects. Call your doctor for medical advice about side effects. You may report side effects to FDA at 1-800-FDA-1088. Where should I keep my medication? This medication is given in a hospital or clinic. It will not be stored at home. NOTE: This sheet is a summary. It may not cover all possible information. If you have questions  about this medicine, talk to your doctor, pharmacist, or health care provider.  2024 Elsevier/Gold Standard (2021-11-07 00:00:00)

## 2024-04-29 LAB — HEPATITIS B CORE ANTIBODY, TOTAL: HEP B CORE AB: NEGATIVE

## 2024-04-30 ENCOUNTER — Inpatient Hospital Stay: Attending: Oncology

## 2024-04-30 VITALS — BP 155/73 | HR 70 | Temp 97.9°F | Resp 18

## 2024-04-30 DIAGNOSIS — C7951 Secondary malignant neoplasm of bone: Secondary | ICD-10-CM | POA: Insufficient documentation

## 2024-04-30 DIAGNOSIS — Z79899 Other long term (current) drug therapy: Secondary | ICD-10-CM | POA: Insufficient documentation

## 2024-04-30 DIAGNOSIS — Z5111 Encounter for antineoplastic chemotherapy: Secondary | ICD-10-CM | POA: Insufficient documentation

## 2024-04-30 DIAGNOSIS — C8510 Unspecified B-cell lymphoma, unspecified site: Secondary | ICD-10-CM

## 2024-04-30 DIAGNOSIS — I2510 Atherosclerotic heart disease of native coronary artery without angina pectoris: Secondary | ICD-10-CM | POA: Diagnosis not present

## 2024-04-30 DIAGNOSIS — I252 Old myocardial infarction: Secondary | ICD-10-CM | POA: Insufficient documentation

## 2024-04-30 DIAGNOSIS — C8330 Diffuse large B-cell lymphoma, unspecified site: Secondary | ICD-10-CM | POA: Insufficient documentation

## 2024-04-30 DIAGNOSIS — C4362 Malignant melanoma of left upper limb, including shoulder: Secondary | ICD-10-CM | POA: Diagnosis present

## 2024-04-30 DIAGNOSIS — E785 Hyperlipidemia, unspecified: Secondary | ICD-10-CM | POA: Diagnosis not present

## 2024-04-30 DIAGNOSIS — I110 Hypertensive heart disease with heart failure: Secondary | ICD-10-CM | POA: Diagnosis not present

## 2024-04-30 MED ORDER — PEGFILGRASTIM-CBQV 6 MG/0.6ML ~~LOC~~ SOSY
6.0000 mg | PREFILLED_SYRINGE | Freq: Once | SUBCUTANEOUS | Status: AC
  Administered 2024-04-30: 6 mg via SUBCUTANEOUS
  Filled 2024-04-30: qty 0.6

## 2024-04-30 NOTE — Patient Instructions (Signed)

## 2024-05-09 ENCOUNTER — Inpatient Hospital Stay: Admitting: Nutrition

## 2024-05-09 ENCOUNTER — Other Ambulatory Visit (HOSPITAL_BASED_OUTPATIENT_CLINIC_OR_DEPARTMENT_OTHER): Payer: Self-pay

## 2024-05-09 ENCOUNTER — Inpatient Hospital Stay

## 2024-05-09 ENCOUNTER — Inpatient Hospital Stay (HOSPITAL_BASED_OUTPATIENT_CLINIC_OR_DEPARTMENT_OTHER): Admitting: Nurse Practitioner

## 2024-05-09 ENCOUNTER — Encounter: Payer: Self-pay | Admitting: Nurse Practitioner

## 2024-05-09 VITALS — BP 169/69 | HR 71 | Temp 97.7°F | Resp 18 | Ht 61.0 in | Wt 171.9 lb

## 2024-05-09 DIAGNOSIS — Z5111 Encounter for antineoplastic chemotherapy: Secondary | ICD-10-CM | POA: Diagnosis not present

## 2024-05-09 DIAGNOSIS — C8510 Unspecified B-cell lymphoma, unspecified site: Secondary | ICD-10-CM

## 2024-05-09 LAB — CMP (CANCER CENTER ONLY)
ALT: 7 U/L (ref 0–44)
AST: 26 U/L (ref 15–41)
Albumin: 3.8 g/dL (ref 3.5–5.0)
Alkaline Phosphatase: 148 U/L — ABNORMAL HIGH (ref 38–126)
Anion gap: 10 (ref 5–15)
BUN: 11 mg/dL (ref 8–23)
CO2: 24 mmol/L (ref 22–32)
Calcium: 9.2 mg/dL (ref 8.9–10.3)
Chloride: 104 mmol/L (ref 98–111)
Creatinine: 0.52 mg/dL (ref 0.44–1.00)
GFR, Estimated: >60 mL/min (ref >60)
Glucose, Bld: 102 mg/dL — ABNORMAL HIGH (ref 70–99)
Potassium: 3.8 mmol/L (ref 3.5–5.1)
Sodium: 139 mmol/L (ref 135–145)
Total Bilirubin: 0.3 mg/dL (ref 0.0–1.2)
Total Protein: 6.6 g/dL (ref 6.5–8.1)

## 2024-05-09 LAB — CBC WITH DIFFERENTIAL (CANCER CENTER ONLY)
Band Neutrophils: 4 %
Basophils Absolute: 0 K/uL (ref 0.0–0.1)
Basophils Relative: 0 %
Eosinophils Absolute: 0 K/uL (ref 0.0–0.5)
Eosinophils Relative: 0 %
HCT: 32.1 % — ABNORMAL LOW (ref 36.0–46.0)
Hemoglobin: 10.1 g/dL — ABNORMAL LOW (ref 12.0–15.0)
Lymphocytes Relative: 3 %
Lymphs Abs: 0.6 K/uL — ABNORMAL LOW (ref 0.7–4.0)
MCH: 30.3 pg (ref 26.0–34.0)
MCHC: 31.5 g/dL (ref 30.0–36.0)
MCV: 96.4 fL (ref 80.0–100.0)
Monocytes Absolute: 1.9 K/uL — ABNORMAL HIGH (ref 0.1–1.0)
Monocytes Relative: 10 %
Neutro Abs: 16.5 K/uL — ABNORMAL HIGH (ref 1.7–7.7)
Neutrophils Relative %: 83 %
Platelet Count: 216 K/uL (ref 150–400)
RBC: 3.33 MIL/uL — ABNORMAL LOW (ref 3.87–5.11)
RDW: 16.2 % — ABNORMAL HIGH (ref 11.5–15.5)
Smear Review: NORMAL
WBC Count: 19 K/uL — ABNORMAL HIGH (ref 4.0–10.5)
nRBC: 3.7 % — ABNORMAL HIGH (ref 0.0–0.2)

## 2024-05-09 LAB — LACTATE DEHYDROGENASE: LDH: 381 U/L — ABNORMAL HIGH (ref 98–192)

## 2024-05-09 LAB — URIC ACID: Uric Acid, Serum: 4.4 mg/dL (ref 2.5–7.1)

## 2024-05-09 MED ORDER — PREDNISONE 20 MG PO TABS
80.0000 mg | ORAL_TABLET | Freq: Every day | ORAL | 3 refills | Status: AC
Start: 2024-05-09 — End: ?
  Filled 2024-05-09: qty 20, 5d supply, fill #0
  Filled 2024-05-19: qty 20, 5d supply, fill #1

## 2024-05-09 NOTE — Progress Notes (Signed)
 [facility] OFFICE PROGRESS NOTE   Diagnosis: Large B-cell lymphoma  INTERVAL HISTORY:   Lynn Hogan returns as scheduled.  She completed cycle 1R-CHOP on 04/28/2024.  She denies nausea/vomiting.  No mouth sores.  No diarrhea.  She denies neuropathy symptoms.  She had hot flashes when taking steroids.  No bone pain following Udenyca.  She noted a sore throat earlier today, better now.  Several family members have had strep throat.  She denies fever, shaking chills.  Objective:  Vital signs in last 24 hours:  Blood pressure (!) 169/69, pulse 71, temperature 97.7 F (36.5 C), temperature source Temporal, resp. rate 18, height 5' 1 (1.549 m), weight 171 lb 14.4 oz (78 kg), SpO2 98%.    HEENT: No thrush or ulcers.  Unable to visualize posterior pharynx. Resp: Lungs clear bilaterally. Cardio: Regular rate and rhythm. GI: No hepatosplenomegaly. Vascular: No leg edema. Skin: Healed thoracic incision without evidence of infection. Port-A-Cath without erythema.  Lab Results:  Lab Results  Component Value Date   WBC 19.0 (H) 05/09/2024   HGB 10.1 (L) 05/09/2024   HCT 32.1 (L) 05/09/2024   MCV 96.4 05/09/2024   PLT 216 05/09/2024   NEUTROABS PENDING 05/09/2024    Imaging:  No results found.  Medications: I have reviewed the patient's current medications.  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  Large B-cell lymphoma, stage I E, IPI-1, CNS IPI-1 03/21/2024 CT thoracic spine: Progression of lytic osseous metastatic disease at T9 with significant  posterior extension into the spinal canal, mild progressive tumor at the superior endplate of T10 03/21/2024 MRI thoracic and lumbar spine: Metastatic disease at T9 with extension to T10, extraosseous extension into the spinal canal and paraspinal soft tissues, moderate to severe canal stenosis at T9, small T8 metastasis 03/22/2024 CTs: Left apical pleural-based nodules favored scarring, no evidence of a primary malignancy or soft tissue metastasis, T9 and T10 metastases 03/24/2024 MRI brain: No evidence of metastatic disease, 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  Ki-67-60%, germinal center phenotype, MYC (8q24) not detected, MYC/IgH/CEN8t(8:14) not detected 04/18/2024 PET: Intense metabolic activity associated with the T9 lesion, no evidence of additional osseous involvement, no evidence of metastatic lymphadenopathy, normal spleen, new right pleural effusion-favored benign postsurgical Cycle 1R-CHOP 04/28/2024   2.  Back pain secondary to a destructive mass at T9 3.  Hyperlipidemia 4.  History of coronary artery disease, NSTEMI 2020, STEMI 2020 5.  Hypertension 6.  History of CHF 7.  Basal cell carcinomas at the left ankle and right posterior arm excised 03/13/2022  Disposition: [name] appears stable.  She completed cycle 1R-CHOP 04/28/2024.  She tolerated well.  We reviewed the CBC from today.  Counts are adequate.  She will return for follow-up and cycle 2R-CHOP on 05/19/2024.  She had a sore throat this morning, better now.  She will treat symptomatically.  Could be related to chemotherapy.  She also has family members with recent strep throat.  She will seek testing with her PCP or at an urgent care if symptoms persist/worsen.  Plan reviewed with Dr. [name].     Olam Ned ANP/GNP-BC   05/09/2024  1:59 PM

## 2024-05-09 NOTE — Progress Notes (Signed)
 78 year old female diagnosed with large B-cell Lymphoma involving destructive mass at T9/T10 She underwent a decompressive thoracic laminectomy 03/25/2024.  Followed by Dr. [name]. Plan for RCHOP for 3-4 cycles  PMH includes HLD, CAD, NSTEMI, STEMI, HTN, CHF, Basal cell carcinoma  Medications include Vitamin D 3, Prednisone, Zofran , and Compazine  Labs include glucose 102  Height: 5 1. Weight: 171 pounds 14.4 oz. November 10 UBW: 172-180 pounds BMI: 32.48.  Patient states she is eating smaller amounts. She has increased fatigue. Oral intake is better early in the day and she is often too fatigued to eat a lot at dinner. She drinks Fairlife milk and Fairlife Nutrition Plan. Typically has eggs, potatoes, and toast for breakfast. Generally eats a frozen Lean cuisine dinner at lunch. She likes peanut butter, cheese and crackers for snacks. Did not provide details of dinner. She drinks 4 16.9 oz water bottles daily.  Nutrition Diagnosis: Food and Nutrition Related Knowledge Deficit related to lymphoma as evidenced by no prior need for nutrition related information.  Intervention: Educated to eat small, frequent meals and snacks with adequate calories and protein to promote weight maintenance and continued healing. Suggested carnation breakfast essentials mixed with Fairlife milk 1-2 cartons daily.  Continue 1 carton of Nutrition Fairlife Plan. Reviewed small snack suggestions. Questions answered. Nutrition fact sheets provided. Contact information given.  Monitoring, Evaluation, Goals: Tolerate adequate calories and protein for weight maintenance. Address nutrition impact symptoms as needed.  Next Visit: To be scheduled as needed.

## 2024-05-09 NOTE — Patient Instructions (Signed)

## 2024-05-10 ENCOUNTER — Other Ambulatory Visit: Payer: Self-pay

## 2024-05-10 ENCOUNTER — Emergency Department (HOSPITAL_BASED_OUTPATIENT_CLINIC_OR_DEPARTMENT_OTHER)
Admission: EM | Admit: 2024-05-10 | Discharge: 2024-05-10 | Disposition: A | Discharge status: Home / Self Care | Source: Ambulatory Visit | Attending: Emergency Medicine | Admitting: Emergency Medicine

## 2024-05-10 ENCOUNTER — Inpatient Hospital Stay

## 2024-05-10 DIAGNOSIS — Z79899 Other long term (current) drug therapy: Secondary | ICD-10-CM | POA: Diagnosis not present

## 2024-05-10 DIAGNOSIS — U071 COVID-19: Secondary | ICD-10-CM | POA: Insufficient documentation

## 2024-05-10 DIAGNOSIS — Z7982 Long term (current) use of aspirin: Secondary | ICD-10-CM | POA: Insufficient documentation

## 2024-05-10 DIAGNOSIS — R509 Fever, unspecified: Secondary | ICD-10-CM | POA: Diagnosis present

## 2024-05-10 LAB — URINALYSIS, ROUTINE W REFLEX MICROSCOPIC
Bilirubin Urine: NEGATIVE
Glucose, UA: NEGATIVE mg/dL
Ketones, ur: NEGATIVE mg/dL
Nitrite: POSITIVE — AB
Specific Gravity, Urine: 1.022 (ref 1.005–1.030)
WBC, UA: >50 WBC/hpf (ref 0–5)
pH: 6 (ref 5.0–8.0)

## 2024-05-10 LAB — CBC WITH DIFFERENTIAL/PLATELET
Abs Immature Granulocytes: 1.35 K/uL — ABNORMAL HIGH (ref 0.00–0.07)
Basophils Absolute: 0.1 K/uL (ref 0.0–0.1)
Basophils Relative: 1 %
Eosinophils Absolute: 0 K/uL (ref 0.0–0.5)
Eosinophils Relative: 0 %
HCT: 32.3 % — ABNORMAL LOW (ref 36.0–46.0)
Hemoglobin: 10.1 g/dL — ABNORMAL LOW (ref 12.0–15.0)
Immature Granulocytes: 9 %
Lymphocytes Relative: 8 %
Lymphs Abs: 1.2 K/uL (ref 0.7–4.0)
MCH: 30.1 pg (ref 26.0–34.0)
MCHC: 31.3 g/dL (ref 30.0–36.0)
MCV: 96.4 fL (ref 80.0–100.0)
Monocytes Absolute: 1.8 K/uL — ABNORMAL HIGH (ref 0.1–1.0)
Monocytes Relative: 11 %
Neutro Abs: 11.1 K/uL — ABNORMAL HIGH (ref 1.7–7.7)
Neutrophils Relative %: 71 %
Platelets: 283 K/uL (ref 150–400)
RBC: 3.35 MIL/uL — ABNORMAL LOW (ref 3.87–5.11)
RDW: 16.4 % — ABNORMAL HIGH (ref 11.5–15.5)
WBC: 15.1 K/uL — ABNORMAL HIGH (ref 4.0–10.5)
nRBC: 2.6 % — ABNORMAL HIGH (ref 0.0–0.2)

## 2024-05-10 LAB — COMPREHENSIVE METABOLIC PANEL WITH GFR
ALT: 8 U/L (ref 0–44)
AST: 23 U/L (ref 15–41)
Albumin: 3.6 g/dL (ref 3.5–5.0)
Alkaline Phosphatase: 173 U/L — ABNORMAL HIGH (ref 38–126)
Anion gap: 11 (ref 5–15)
BUN: 13 mg/dL (ref 8–23)
CO2: 25 mmol/L (ref 22–32)
Calcium: 9.3 mg/dL (ref 8.9–10.3)
Chloride: 100 mmol/L (ref 98–111)
Creatinine, Ser: 0.54 mg/dL (ref 0.44–1.00)
GFR, Estimated: >60 mL/min (ref >60)
Glucose, Bld: 136 mg/dL — ABNORMAL HIGH (ref 70–99)
Potassium: 3.6 mmol/L (ref 3.5–5.1)
Sodium: 136 mmol/L (ref 135–145)
Total Bilirubin: 0.2 mg/dL (ref 0.0–1.2)
Total Protein: 6.6 g/dL (ref 6.5–8.1)

## 2024-05-10 LAB — LACTIC ACID, PLASMA: Lactic Acid, Venous: 1.5 mmol/L (ref 0.5–1.9)

## 2024-05-10 LAB — RESP PANEL BY RT-PCR (RSV, FLU A&B, COVID)  RVPGX2
Influenza A by PCR: NEGATIVE
Influenza B by PCR: NEGATIVE
Resp Syncytial Virus by PCR: NEGATIVE
SARS Coronavirus 2 by RT PCR: POSITIVE — AB

## 2024-05-10 LAB — GROUP A STREP BY PCR: Group A Strep by PCR: NOT DETECTED

## 2024-05-10 MED ORDER — PAXLOVID (300/100) 20 X 150 MG & 10 X 100MG PO TBPK
3.0000 | ORAL_TABLET | Freq: Two times a day (BID) | ORAL | 0 refills | Status: AC
  Filled 2024-05-10: qty 30, 5d supply, fill #0

## 2024-05-10 NOTE — ED Triage Notes (Signed)
 Pt POV reporting sinus congestion and sore throat since yesterday, daughter dx strep last week. Chemo pt.

## 2024-05-10 NOTE — Discharge Instructions (Addendum)
 As we discussed, a prescription for Paxlovid was sent to your pharmacy. You can fill it after discussion with your oncologist as you have said you prefer, which is entirely reasonable.   Please return to the ED with any new or worsening symptoms, specifically, high fever, vomiting, shortness of breath, cough.

## 2024-05-10 NOTE — ED Provider Notes (Signed)
 [place] EMERGENCY DEPARTMENT AT [facility] - Closed Provider Note   CSN: [id number] Arrival date & time: 05/10/24  1844     Patient presents with: URI   Lynn Hogan is a 78 y.o. female.   Patient with history of lymphoma, on chemo, presents with symptoms of sore throat yesterday. Today she had a fever Tmax 100.4 about 45 minutes after Tylenol , and sinus congestion and drainage. No more soreness in the throat today. No nausea, vomiting, cough or diarrhea. Family member with strep throat 2 weeks ago with others who have been ill.   The history is provided by the patient and a relative. No language interpreter was used.  URI      Prior to Admission medications   Medication Sig Start Date End Date Taking? Authorizing Provider  nirmatrelvir/ritonavir (PAXLOVID, 300/100,) 20 x 150 MG & 10 x 100MG  TBPK Take 3 tablets by mouth 2 (two) times daily for 5 days. Patient GFR is >60. Take nirmatrelvir (150 mg) two tablets twice daily for 5 days and ritonavir (100 mg) one tablet twice daily for 5 days. 05/10/24 05/15/24 Yes Pier Laux, Margit, PA-C  acetaminophen  (TYLENOL ) 325 MG tablet Take 325 mg by mouth every 6 (six) hours as needed for mild pain (pain score 1-3).    [provider]  aspirin  EC 81 MG EC tablet Take 1 tablet (81 mg total) by mouth daily. 08/03/18   Bhagat, Aleene, PA  atorvastatin  (LIPITOR ) 40 MG tablet Take 1 tablet (40 mg total) by mouth daily. 08/09/21   Burnard Debby LABOR, MD  Cholecalciferol (VITAMIN D3) 10 MCG (400 UNIT) tablet Take 400 Units by mouth every evening.    [provider]  diclofenac  Sodium (VOLTAREN ) 1 % GEL Apply 4 g topically 4 (four) times daily. Patient not taking: Reported on 05/09/2024 01/13/24   Emil Share, DO  ezetimibe  (ZETIA ) 10 MG tablet TAKE 1 TABLET EVERY DAY (NEED MD APPOINTMENT FOR REFILLS) 12/25/23   Burnard Debby LABOR, MD  lidocaine -prilocaine (EMLA) cream Apply 1 Application topically as needed (Apply 1-2 hours prior to port  use). 04/22/24   [name] Arley NOVAK, MD  metoprolol  succinate (TOPROL -XL) 25 MG 24 hr tablet Take 1 tablet (25 mg total) by mouth daily. TAKE 1 TABLET EVERY DAY WITH OR IMMEDIATELY FOLLOWING A MEAL. Please call [phone] to schedule an overdue appointment for future refills. Thank you. 04/09/24   Patsy Lenis, MD  nitroGLYCERIN  (NITROSTAT ) 0.4 MG SL tablet Place 1 tablet (0.4 mg total) under the tongue every 5 (five) minutes as needed for chest pain. Patient not taking: Reported on 05/09/2024 05/21/20 07/18/21  Meng, Hao, PA  ondansetron  (ZOFRAN ) 8 MG tablet Take 1 tablet (8 mg total) by mouth every 8 (eight) hours as needed (Start 3 days after chemo as needed for nausea). 04/22/24   [name] Arley NOVAK, MD  ondansetron  (ZOFRAN -ODT) 4 MG disintegrating tablet Take 1 tablet (4 mg total) by mouth every 8 (eight) hours as needed for nausea or vomiting. Patient not taking: Reported on 05/09/2024 04/09/24   Patsy Lenis, MD  predniSONE (DELTASONE) 20 MG tablet Take 4 tablets (80 mg total) by mouth daily with breakfast. Starting day after each chemo cycle for 4 days 05/09/24   Thomas, Lisa K, NP  prochlorperazine (COMPAZINE) 10 MG tablet Take 1 tablet (10 mg total) by mouth every 6 (six) hours as needed for nausea or vomiting. 04/22/24   [name] Arley NOVAK, MD    Allergies: Ibuprofen and Nsaids    Review of Systems  Updated Vital Signs BP 135/84   Pulse 73   Temp 98.2 F (36.8 C)   Resp 18   SpO2 92%   Physical Exam Vitals and nursing note reviewed.  Constitutional:      General: She is not in acute distress.    Appearance: Normal appearance. She is not ill-appearing.  HENT:     Head: Normocephalic.     Right Ear: Tympanic membrane and ear canal normal.     Left Ear: Tympanic membrane and ear canal normal.     Nose: Nose normal.     Mouth/Throat:     Mouth: Mucous membranes are moist.     Pharynx: No oropharyngeal exudate or posterior oropharyngeal erythema.  Eyes:      Conjunctiva/sclera: Conjunctivae normal.  Cardiovascular:     Rate and Rhythm: Normal rate and regular rhythm.     Heart sounds: No murmur heard. Pulmonary:     Effort: Pulmonary effort is normal.     Breath sounds: No wheezing, rhonchi or rales.  Abdominal:     General: There is no distension.     Palpations: Abdomen is soft.  Musculoskeletal:     Cervical back: Normal range of motion and neck supple.  Skin:    General: Skin is warm and dry.  Neurological:     Mental Status: She is alert.     (all labs ordered are listed, but only abnormal results are displayed) Labs Reviewed  RESP PANEL BY RT-PCR (RSV, FLU A&B, COVID)  RVPGX2 - Abnormal; Notable for the following components:      Result Value   SARS Coronavirus 2 by RT PCR POSITIVE (*)    All other components within normal limits  CBC WITH DIFFERENTIAL/PLATELET - Abnormal; Notable for the following components:   WBC 15.1 (*)    RBC 3.35 (*)    Hemoglobin 10.1 (*)    HCT 32.3 (*)    RDW 16.4 (*)    nRBC 2.6 (*)    Neutro Abs 11.1 (*)    Monocytes Absolute 1.8 (*)    Abs Immature Granulocytes 1.35 (*)    All other components within normal limits  COMPREHENSIVE METABOLIC PANEL WITH GFR - Abnormal; Notable for the following components:   Glucose, Bld 136 (*)    Alkaline Phosphatase 173 (*)    All other components within normal limits  GROUP A STREP BY PCR  CULTURE, BLOOD (ROUTINE X 2)  CULTURE, BLOOD (ROUTINE X 2)  LACTIC ACID, PLASMA  LACTIC ACID, PLASMA  URINALYSIS, ROUTINE W REFLEX MICROSCOPIC    EKG: None  Radiology: No results found.   Procedures   Medications Ordered in the ED - No data to display  Clinical Course as of 05/10/24 2200  Tue May 10, 2024  1924 Very well appearing patient with ST yesterday that is better today, congestion and drainage earlier, along with fever. History lymphoma, on chemo. Fever today requiring full evaluation. Labs, including blood cultures collected, pending. Patient and  daughter updated on plan.  [SU]  2148 WBC 15.5 - felt related to post-chemo treatment. Comparable to 19.0 yesterday. Hgb 10.1, c/w baseline. Lactic acid normal at 1.5. Patient is COVID positive.   No cough, tachypnea, SOB, chest symptoms. CXR not felt indicated. No fever in ED. Discussed Paxlovid treatment as recommended and patient states she would rather discuss this with her oncologist prior to starting it. A prescription will be sent to her pharmacy for her to fill if wanted.   She can be  discharged home and will need to make her oncologist aware of diagnosis as this may affect upcoming treatments.   Discussed strict return precautions.  [SU]    Clinical Course User Index [SU] Odell Balls, PA-C                                 Medical Decision Making Amount and/or Complexity of Data Reviewed Labs: ordered.        Final diagnoses:  COVID    ED Discharge Orders          Ordered    nirmatrelvir/ritonavir (PAXLOVID, 300/100,) 20 x 150 MG & 10 x 100MG  TBPK  2 times daily        05/10/24 2156               Odell Balls, PA-C 05/10/24 2200    Ellouise Fine K, DO 05/10/24 2240

## 2024-05-10 NOTE — Progress Notes (Signed)
 [facility] Clinical Social Work  Initial Assessment   Lynn Hogan is a 78 y.o. year old female contacted by phone. Clinical Social Work was referred by nurse for assessment of psychosocial needs.   SDOH (Social Determinants of Health) assessments performed: Yes   SDOH Screenings   Food Insecurity: No Food Insecurity (05/10/2024)  Housing: Low Risk  (05/10/2024)  Transportation Needs: No Transportation Needs (05/10/2024)  Utilities: Not At Risk (05/10/2024)  Depression (PHQ2-9): Low Risk  (05/10/2024)  Financial Resource Strain: Low Risk  (08/01/2018)  Physical Activity: Sufficiently Active (08/01/2018)  Social Connections: Socially Isolated (03/22/2024)  Stress: No Stress Concern Present (05/10/2024)  Tobacco Use: Low Risk  (05/09/2024)    PHQ 2/9:    05/10/2024    1:42 PM 05/09/2024    1:00 PM 04/30/2024   10:19 AM  Depression screen PHQ 2/9  Decreased Interest 0 0 0  Down, Depressed, Hopeless 0 0 0  PHQ - 2 Score 0 0 0     Distress Screen completed: Yes    04/22/2024   12:00 PM  ONCBCN DISTRESS SCREENING  Screening Type Initial Screening  How much distress have you been experiencing in the past week? (0-10) 3  Practical concerns type Taking care of myself;Transportation  Physical Concerns Type  Sleep;Changes in eating      Family/Social Information:  Housing Arrangement: patient lives with her daughter and 4 dogs. Family members/support persons in your life? Family Transportation concerns: Patient relies on her daughters to transport her to appointments. No concerns noted.   Employment: Retired .  Income source: Special Educational Needs Teacher Income Financial concerns: No Type of concern: None Food access concerns: no Religious or spiritual practice: Yes-[employer] Advanced directives: Not known Services Currently in place:  Family, Community Education Officer, Income, Housing  Coping/ Adjustment to diagnosis: Patient understands treatment plan and what happens next? yes Concerns about  diagnosis and/or treatment: No concerns noted.  Patient reported stressors: No areas of stress noted. Current coping skills/ strengths: Ability for insight , Active sense of humor , Average or above average intelligence , Capable of independent living , Communication skills , Financial means , General fund of knowledge , Motivation for treatment/growth , Religious Affiliation , and Supportive family/friends     SUMMARY: Current SDOH Barriers:  No SDOH barriers noted.   Clinical Social Work Clinical Goal(s):  No clinical social work goals at this time  Interventions: Discussed common feeling and emotions when being diagnosed with cancer, and the importance of support during treatment Informed patient of the support team roles and support services at [facility] including the Blood Cancer Support Group and Clinical Counseling. Provided CSW contact information and encouraged patient to call with any questions or concerns  Follow Up Plan: Patient will contact CSW with any support or resource needs Patient verbalizes understanding of plan: Yes    Lizbeth Sprague, LCSW Clinical Social Worker [facility]

## 2024-05-11 ENCOUNTER — Other Ambulatory Visit (HOSPITAL_BASED_OUTPATIENT_CLINIC_OR_DEPARTMENT_OTHER): Payer: Self-pay

## 2024-05-11 ENCOUNTER — Telehealth: Payer: Self-pay | Admitting: *Deleted

## 2024-05-11 ENCOUNTER — Other Ambulatory Visit: Payer: Self-pay

## 2024-05-11 MED ORDER — SULFAMETHOXAZOLE-TRIMETHOPRIM 800-160 MG PO TABS
1.0000 | ORAL_TABLET | Freq: Two times a day (BID) | ORAL | 0 refills | Status: DC
  Filled 2024-05-11: qty 6, 3d supply, fill #0

## 2024-05-11 NOTE — Telephone Encounter (Signed)
 Daughter reports Lynn Hogan went to ER last night and was diagnosed with COVID and asking if OK to start Paxlovid that was prescribed? Also seeing U/A results and asking if she needs antibiotic? Has been having some intermittent dysuria. Informed daughter to start Paxlovid and will call in Septra  DS bid x 3 days. Call if not better.

## 2024-05-15 ENCOUNTER — Other Ambulatory Visit: Payer: Self-pay | Admitting: Oncology

## 2024-05-15 LAB — CULTURE, BLOOD (ROUTINE X 2)
Culture: NO GROWTH
Culture: NO GROWTH
Special Requests: ADEQUATE
Special Requests: ADEQUATE

## 2024-05-19 ENCOUNTER — Inpatient Hospital Stay

## 2024-05-19 ENCOUNTER — Other Ambulatory Visit (HOSPITAL_BASED_OUTPATIENT_CLINIC_OR_DEPARTMENT_OTHER): Payer: Self-pay

## 2024-05-19 ENCOUNTER — Encounter: Payer: Self-pay | Admitting: *Deleted

## 2024-05-19 ENCOUNTER — Other Ambulatory Visit: Payer: Self-pay | Admitting: *Deleted

## 2024-05-19 ENCOUNTER — Inpatient Hospital Stay: Admitting: Oncology

## 2024-05-19 VITALS — BP 122/65 | HR 71 | Temp 97.9°F | Resp 18

## 2024-05-19 VITALS — BP 121/60 | HR 66 | Temp 97.9°F | Resp 18 | Ht 61.0 in | Wt 168.3 lb

## 2024-05-19 DIAGNOSIS — C8510 Unspecified B-cell lymphoma, unspecified site: Secondary | ICD-10-CM

## 2024-05-19 DIAGNOSIS — Z5111 Encounter for antineoplastic chemotherapy: Secondary | ICD-10-CM | POA: Diagnosis not present

## 2024-05-19 LAB — CBC WITH DIFFERENTIAL (CANCER CENTER ONLY)
Abs Immature Granulocytes: 0.2 K/uL — ABNORMAL HIGH (ref 0.00–0.07)
Basophils Absolute: 0.1 K/uL (ref 0.0–0.1)
Basophils Relative: 1 %
Eosinophils Absolute: 0 K/uL (ref 0.0–0.5)
Eosinophils Relative: 0 %
HCT: 32.5 % — ABNORMAL LOW (ref 36.0–46.0)
Hemoglobin: 10.4 g/dL — ABNORMAL LOW (ref 12.0–15.0)
Immature Granulocytes: 2 %
Lymphocytes Relative: 22 %
Lymphs Abs: 2.4 K/uL (ref 0.7–4.0)
MCH: 30.4 pg (ref 26.0–34.0)
MCHC: 32 g/dL (ref 30.0–36.0)
MCV: 95 fL (ref 80.0–100.0)
Monocytes Absolute: 1.2 K/uL — ABNORMAL HIGH (ref 0.1–1.0)
Monocytes Relative: 11 %
Neutro Abs: 7 K/uL (ref 1.7–7.7)
Neutrophils Relative %: 64 %
Platelet Count: 596 K/uL — ABNORMAL HIGH (ref 150–400)
RBC: 3.42 MIL/uL — ABNORMAL LOW (ref 3.87–5.11)
RDW: 16.9 % — ABNORMAL HIGH (ref 11.5–15.5)
WBC Count: 10.9 K/uL — ABNORMAL HIGH (ref 4.0–10.5)
nRBC: 0.2 % (ref 0.0–0.2)

## 2024-05-19 LAB — CMP (CANCER CENTER ONLY)
ALT: 11 U/L (ref 0–44)
AST: 22 U/L (ref 15–41)
Albumin: 3.9 g/dL (ref 3.5–5.0)
Alkaline Phosphatase: 114 U/L (ref 38–126)
Anion gap: 11 (ref 5–15)
BUN: 13 mg/dL (ref 8–23)
CO2: 24 mmol/L (ref 22–32)
Calcium: 9.6 mg/dL (ref 8.9–10.3)
Chloride: 101 mmol/L (ref 98–111)
Creatinine: 0.58 mg/dL (ref 0.44–1.00)
GFR, Estimated: >60 mL/min (ref >60)
Glucose, Bld: 128 mg/dL — ABNORMAL HIGH (ref 70–99)
Potassium: 3.9 mmol/L (ref 3.5–5.1)
Sodium: 137 mmol/L (ref 135–145)
Total Bilirubin: 0.2 mg/dL (ref 0.0–1.2)
Total Protein: 6.8 g/dL (ref 6.5–8.1)

## 2024-05-19 MED ORDER — PALONOSETRON HCL INJECTION 0.25 MG/5ML
0.2500 mg | Freq: Once | INTRAVENOUS | Status: AC
  Administered 2024-05-19: 0.25 mg via INTRAVENOUS
  Filled 2024-05-19: qty 5

## 2024-05-19 MED ORDER — SODIUM CHLORIDE 0.9 % IV SOLN
375.0000 mg/m2 | Freq: Once | INTRAVENOUS | Status: AC
  Administered 2024-05-19: 700 mg via INTRAVENOUS
  Filled 2024-05-19: qty 50

## 2024-05-19 MED ORDER — DEXAMETHASONE SOD PHOSPHATE PF 10 MG/ML IJ SOLN
10.0000 mg | Freq: Once | INTRAMUSCULAR | Status: AC
  Administered 2024-05-19: 10 mg via INTRAVENOUS

## 2024-05-19 MED ORDER — VINCRISTINE SULFATE CHEMO INJECTION 1 MG/ML
1.0000 mg | Freq: Once | INTRAVENOUS | Status: AC
  Administered 2024-05-19: 1 mg via INTRAVENOUS
  Filled 2024-05-19: qty 1

## 2024-05-19 MED ORDER — APREPITANT 130 MG/18ML IV EMUL
130.0000 mg | Freq: Once | INTRAVENOUS | Status: AC
  Administered 2024-05-19: 130 mg via INTRAVENOUS
  Filled 2024-05-19: qty 18

## 2024-05-19 MED ORDER — DOXORUBICIN HCL CHEMO IV INJECTION 2 MG/ML
50.0000 mg/m2 | Freq: Once | INTRAVENOUS | Status: AC
  Administered 2024-05-19: 90 mg via INTRAVENOUS
  Filled 2024-05-19: qty 45

## 2024-05-19 MED ORDER — SODIUM CHLORIDE 0.9 % IV SOLN
750.0000 mg/m2 | Freq: Once | INTRAVENOUS | Status: AC
  Administered 2024-05-19: 1500 mg via INTRAVENOUS
  Filled 2024-05-19: qty 50

## 2024-05-19 MED ORDER — ACETAMINOPHEN 325 MG PO TABS
650.0000 mg | ORAL_TABLET | Freq: Once | ORAL | Status: AC
  Administered 2024-05-19: 650 mg via ORAL
  Filled 2024-05-19: qty 2

## 2024-05-19 MED ORDER — SODIUM CHLORIDE 0.9 % IV SOLN: INTRAVENOUS | Status: DC

## 2024-05-19 MED ORDER — DIPHENHYDRAMINE HCL 25 MG PO CAPS
50.0000 mg | ORAL_CAPSULE | Freq: Once | ORAL | Status: AC
  Administered 2024-05-19: 50 mg via ORAL
  Filled 2024-05-19: qty 2

## 2024-05-19 NOTE — Progress Notes (Signed)
 [facility] OFFICE PROGRESS NOTE   Diagnosis: Non-Hodgkin's lymphoma  INTERVAL HISTORY:   Lynn Hogan completed cycle 1 R-CHOP on 04/28/2020.  She received G-CSF on 04/30/2024.  No nausea/vomiting, mouth sores, or neuropathy symptoms.  No bone pain following G-CSF.  She was seen in the emergency room 05/10/2024 with a low-grade fever and sore throat.  She was diagnosed with a urinary tract infection and COVID-19 infection.  She was treated with Paxlovid.  No further fever.  She feels well.  No difficulty with bowel or bladder function.  She continues to have back discomfort.   Objective:  Vital signs in last 24 hours:  Blood pressure 121/60, pulse 66, temperature 97.9 F (36.6 C), temperature source Oral, resp. rate 18, height 5' 1 (1.549 m), weight 168 lb 5 oz (76.3 kg), SpO2 100%.    HEENT: No thrush or ulcers Resp: Lungs clear bilaterally Cardio: Agler rate and rhythm GI: Nontender, no hepatosplenomegaly Vascular: No leg edema    Portacath/PICC-without erythema  Lab Results:  Lab Results  Component Value Date   WBC 10.9 (H) 05/19/2024   HGB 10.4 (L) 05/19/2024   HCT 32.5 (L) 05/19/2024   MCV 95.0 05/19/2024   PLT 596 (H) 05/19/2024   NEUTROABS 7.0 05/19/2024    CMP  Lab Results  Component Value Date   NA 136 05/10/2024   K 3.6 05/10/2024   CL 100 05/10/2024   CO2 25 05/10/2024   GLUCOSE 136 (H) 05/10/2024   BUN 13 05/10/2024   CREATININE 0.54 05/10/2024   CALCIUM  9.3 05/10/2024   PROT 6.6 05/10/2024   ALBUMIN 3.6 05/10/2024   AST 23 05/10/2024   ALT 8 05/10/2024   ALKPHOS 173 (H) 05/10/2024   BILITOT 0.2 05/10/2024   GFRNONAA >60 05/10/2024   GFRAA >60 08/11/2018     Medications: I have reviewed the patient's current medications.   Assessment/Plan:  Melanoma 03/13/2022 left forearm malignant melanoma, nodular type, ulcerated, 2.2 x 1.4 x 0.8 cm, Breslow thickness 14 mm, Clark level V, stage IIc (pT4b pN0 ( 03/28/2022 needle biopsy  left axillary lymph node-lymphoid tissue, no evidence of malignancy 04/24/2022 left forearm reexcision-no evidence of residual melanoma, 2 left axillary sentinel nodes negative for malignancy 11/23/2022 PET: Tracer avid lytic bone lesions at T9 and T10 2.  Large B-cell lymphoma, stage I E, IPI-1, CNS IPI-1 03/21/2024 CT thoracic spine: Progression of lytic osseous metastatic disease at T9 with significant posterior extension into the spinal canal, mild progressive tumor at the superior endplate of T10 03/21/2024 MRI thoracic and lumbar spine: Metastatic disease at T9 with extension to T10, extraosseous extension into the spinal canal and paraspinal soft tissues, moderate to severe canal stenosis at T9, small T8 metastasis 03/22/2024 CTs: Left apical pleural-based nodules favored scarring, no evidence of a primary malignancy or soft tissue metastasis, T9 and T10 metastases 03/24/2024 MRI brain: No evidence of metastatic disease, 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  Ki-67-60%, germinal center phenotype, MYC (8q24) not detected, MYC/IgH/CEN8t(8:14) not detected 04/18/2024 PET: Intense metabolic activity associated with the T9 lesion, no evidence of additional osseous involvement, no evidence of metastatic lymphadenopathy, normal spleen, new right pleural effusion-favored benign postsurgical Cycle 1R-CHOP 04/28/2024 Cycle 2 R-CHOP 05/19/2024   2.  Back pain secondary to a destructive mass at T9 3.  Hyperlipidemia 4.  History of coronary artery disease, NSTEMI 2020, STEMI 2020 5.  Hypertension 6.  History of  CHF 7.  Basal cell carcinomas at the left ankle and right posterior arm excised 03/13/2022   Disposition: [name] tolerated the first cycle of R-CHOP well.  She was treated for urinary tract infection and COVID-19 last week.  She has no symptoms of an active infection  today.  She will complete cycle 2 R-CHOP today.  She will return for an office visit and cycle 3 chemotherapy 06/09/2024.  [name] undergo a restaging PET after cycle 3.  We will make a referral to radiation oncology to consider postchemotherapy radiation.  Arley Hof, MD  05/19/2024  8:24 AM

## 2024-05-19 NOTE — Patient Instructions (Signed)
 [facility] - A DEPT OF . [facility]  Discharge Instructions: Thank you for choosing [facility] to provide your oncology and hematology care.   If you have a lab appointment with the Cancer Center, please go directly to the Cancer Center and check in at the registration area.   Wear comfortable clothing and clothing appropriate for easy access to any Portacath or PICC line.   We strive to give you quality time with your provider. You may need to reschedule your appointment if you arrive late (15 or more minutes).  Arriving late affects you and other patients whose appointments are after yours.  Also, if you miss three or more appointments without notifying the office, you may be dismissed from the clinic at the provider's discretion.      For prescription refill requests, have your pharmacy contact our office and allow 72 hours for refills to be completed.    Today you received the following chemotherapy and/or immunotherapy agents: doxorubicin, vincristine, cytoxan, rituximab      To help prevent nausea and vomiting after your treatment, we encourage you to take your nausea medication as directed.  BELOW ARE SYMPTOMS THAT SHOULD BE REPORTED IMMEDIATELY: *FEVER GREATER THAN 100.4 F (38 C) OR HIGHER *CHILLS OR SWEATING *NAUSEA AND VOMITING THAT IS NOT CONTROLLED WITH YOUR NAUSEA MEDICATION *UNUSUAL SHORTNESS OF BREATH *UNUSUAL BRUISING OR BLEEDING *URINARY PROBLEMS (pain or burning when urinating, or frequent urination) *BOWEL PROBLEMS (unusual diarrhea, constipation, pain near the anus) TENDERNESS IN MOUTH AND THROAT WITH OR WITHOUT PRESENCE OF ULCERS (sore throat, sores in mouth, or a toothache) UNUSUAL RASH, SWELLING OR PAIN  UNUSUAL VAGINAL DISCHARGE OR ITCHING   Items with * indicate a potential emergency and should be followed up as soon as possible or go to the Emergency Department if any problems should occur.  Please show the  CHEMOTHERAPY ALERT CARD or IMMUNOTHERAPY ALERT CARD at check-in to the Emergency Department and triage nurse.  Should you have questions after your visit or need to cancel or reschedule your appointment, please contact [facility] - A DEPT OF [facility]  Dept: [phone]  and follow the prompts.  Office hours are 8:00 a.m. to 4:30 p.m. Monday - Friday. Please note that voicemails left after 4:00 p.m. may not be returned until the following business day.  We are closed weekends and major holidays. You have access to a nurse at all times for urgent questions. Please call the main number to the clinic Dept: [phone] and follow the prompts.   For any non-urgent questions, you may also contact your provider using MyChart. We now offer e-Visits for anyone 31 and older to request care online for non-urgent symptoms. For details visit [email or web address].   Also download the MyChart app! Go to the app store, search MyChart, open the app, select [facility], and log in with your MyChart username and password.

## 2024-05-19 NOTE — Progress Notes (Signed)
 Patient seen by Dr. Arley Hof today  Vitals are within treatment parameters:Yes   Labs are within treatment parameters: Yes   Treatment plan has been signed: Yes   Per physician team, Patient is ready for treatment and there are NO modifications to the treatment plan.

## 2024-05-19 NOTE — Progress Notes (Signed)
 Referral entered for radiation oncology

## 2024-05-20 ENCOUNTER — Other Ambulatory Visit: Payer: Self-pay

## 2024-05-21 ENCOUNTER — Inpatient Hospital Stay

## 2024-05-21 VITALS — BP 138/54 | HR 61 | Temp 97.7°F | Resp 16

## 2024-05-21 DIAGNOSIS — C8510 Unspecified B-cell lymphoma, unspecified site: Secondary | ICD-10-CM

## 2024-05-21 DIAGNOSIS — Z5111 Encounter for antineoplastic chemotherapy: Secondary | ICD-10-CM | POA: Diagnosis not present

## 2024-05-21 MED ORDER — PEGFILGRASTIM-CBQV 6 MG/0.6ML ~~LOC~~ SOSY
6.0000 mg | PREFILLED_SYRINGE | Freq: Once | SUBCUTANEOUS | Status: AC
  Administered 2024-05-21: 6 mg via SUBCUTANEOUS

## 2024-06-05 ENCOUNTER — Other Ambulatory Visit: Payer: Self-pay | Admitting: Oncology

## 2024-06-09 ENCOUNTER — Encounter: Payer: Self-pay | Admitting: Nurse Practitioner

## 2024-06-09 ENCOUNTER — Inpatient Hospital Stay: Attending: Oncology | Admitting: Nurse Practitioner

## 2024-06-09 ENCOUNTER — Inpatient Hospital Stay

## 2024-06-09 ENCOUNTER — Inpatient Hospital Stay: Attending: Oncology

## 2024-06-09 VITALS — BP 121/60 | HR 63 | Temp 98.0°F | Resp 18

## 2024-06-09 VITALS — BP 140/78 | HR 73 | Temp 97.8°F | Resp 18 | Ht 61.0 in | Wt 172.9 lb

## 2024-06-09 DIAGNOSIS — Z5111 Encounter for antineoplastic chemotherapy: Secondary | ICD-10-CM | POA: Insufficient documentation

## 2024-06-09 DIAGNOSIS — C8510 Unspecified B-cell lymphoma, unspecified site: Secondary | ICD-10-CM | POA: Diagnosis not present

## 2024-06-09 DIAGNOSIS — C4362 Malignant melanoma of left upper limb, including shoulder: Secondary | ICD-10-CM | POA: Diagnosis present

## 2024-06-09 DIAGNOSIS — I2510 Atherosclerotic heart disease of native coronary artery without angina pectoris: Secondary | ICD-10-CM | POA: Insufficient documentation

## 2024-06-09 DIAGNOSIS — E785 Hyperlipidemia, unspecified: Secondary | ICD-10-CM | POA: Insufficient documentation

## 2024-06-09 DIAGNOSIS — I110 Hypertensive heart disease with heart failure: Secondary | ICD-10-CM | POA: Diagnosis not present

## 2024-06-09 DIAGNOSIS — I252 Old myocardial infarction: Secondary | ICD-10-CM | POA: Diagnosis not present

## 2024-06-09 DIAGNOSIS — C7951 Secondary malignant neoplasm of bone: Secondary | ICD-10-CM | POA: Insufficient documentation

## 2024-06-09 DIAGNOSIS — C8330 Diffuse large B-cell lymphoma, unspecified site: Secondary | ICD-10-CM | POA: Insufficient documentation

## 2024-06-09 DIAGNOSIS — Z79899 Other long term (current) drug therapy: Secondary | ICD-10-CM | POA: Insufficient documentation

## 2024-06-09 LAB — CBC WITH DIFFERENTIAL (CANCER CENTER ONLY)
Abs Immature Granulocytes: 0.04 K/uL (ref 0.00–0.07)
Basophils Absolute: 0.1 K/uL (ref 0.0–0.1)
Basophils Relative: 1 %
Eosinophils Absolute: 0 K/uL (ref 0.0–0.5)
Eosinophils Relative: 0 %
HCT: 29.6 % — ABNORMAL LOW (ref 36.0–46.0)
Hemoglobin: 9.5 g/dL — ABNORMAL LOW (ref 12.0–15.0)
Immature Granulocytes: 1 %
Lymphocytes Relative: 20 %
Lymphs Abs: 1.3 K/uL (ref 0.7–4.0)
MCH: 29.9 pg (ref 26.0–34.0)
MCHC: 32.1 g/dL (ref 30.0–36.0)
MCV: 93.1 fL (ref 80.0–100.0)
Monocytes Absolute: 1 K/uL (ref 0.1–1.0)
Monocytes Relative: 15 %
Neutro Abs: 4.2 K/uL (ref 1.7–7.7)
Neutrophils Relative %: 63 %
Platelet Count: 652 K/uL — ABNORMAL HIGH (ref 150–400)
RBC: 3.18 MIL/uL — ABNORMAL LOW (ref 3.87–5.11)
RDW: 17.7 % — ABNORMAL HIGH (ref 11.5–15.5)
WBC Count: 6.7 K/uL (ref 4.0–10.5)
nRBC: 0 % (ref 0.0–0.2)

## 2024-06-09 LAB — CMP (CANCER CENTER ONLY)
ALT: 11 U/L (ref 0–44)
AST: 19 U/L (ref 15–41)
Albumin: 3.9 g/dL (ref 3.5–5.0)
Alkaline Phosphatase: 104 U/L (ref 38–126)
Anion gap: 12 (ref 5–15)
BUN: 16 mg/dL (ref 8–23)
CO2: 25 mmol/L (ref 22–32)
Calcium: 10.1 mg/dL (ref 8.9–10.3)
Chloride: 104 mmol/L (ref 98–111)
Creatinine: 0.51 mg/dL (ref 0.44–1.00)
GFR, Estimated: >60 mL/min (ref >60)
Glucose, Bld: 114 mg/dL — ABNORMAL HIGH (ref 70–99)
Potassium: 4 mmol/L (ref 3.5–5.1)
Sodium: 140 mmol/L (ref 135–145)
Total Bilirubin: 0.2 mg/dL (ref 0.0–1.2)
Total Protein: 7 g/dL (ref 6.5–8.1)

## 2024-06-09 MED ORDER — SODIUM CHLORIDE 0.9 % IV SOLN
375.0000 mg/m2 | Freq: Once | INTRAVENOUS | Status: AC
  Administered 2024-06-09: 700 mg via INTRAVENOUS
  Filled 2024-06-09: qty 50

## 2024-06-09 MED ORDER — SODIUM CHLORIDE 0.9 % IV SOLN: INTRAVENOUS | Status: DC

## 2024-06-09 MED ORDER — DEXAMETHASONE SOD PHOSPHATE PF 10 MG/ML IJ SOLN
10.0000 mg | Freq: Once | INTRAMUSCULAR | Status: AC
  Administered 2024-06-09: 10 mg via INTRAVENOUS

## 2024-06-09 MED ORDER — PALONOSETRON HCL INJECTION 0.25 MG/5ML
0.2500 mg | Freq: Once | INTRAVENOUS | Status: AC
  Administered 2024-06-09: 0.25 mg via INTRAVENOUS
  Filled 2024-06-09: qty 5

## 2024-06-09 MED ORDER — DOXORUBICIN HCL CHEMO IV INJECTION 2 MG/ML
50.0000 mg/m2 | Freq: Once | INTRAVENOUS | Status: AC
  Administered 2024-06-09: 90 mg via INTRAVENOUS
  Filled 2024-06-09: qty 45

## 2024-06-09 MED ORDER — DIPHENHYDRAMINE HCL 25 MG PO CAPS
50.0000 mg | ORAL_CAPSULE | Freq: Once | ORAL | Status: AC
  Administered 2024-06-09: 50 mg via ORAL
  Filled 2024-06-09: qty 2

## 2024-06-09 MED ORDER — VINCRISTINE SULFATE CHEMO INJECTION 1 MG/ML
1.0000 mg | Freq: Once | INTRAVENOUS | Status: AC
  Administered 2024-06-09: 1 mg via INTRAVENOUS
  Filled 2024-06-09: qty 1

## 2024-06-09 MED ORDER — SODIUM CHLORIDE 0.9 % IV SOLN
750.0000 mg/m2 | Freq: Once | INTRAVENOUS | Status: AC
  Administered 2024-06-09: 1500 mg via INTRAVENOUS
  Filled 2024-06-09: qty 50

## 2024-06-09 MED ORDER — ACETAMINOPHEN 325 MG PO TABS
650.0000 mg | ORAL_TABLET | Freq: Once | ORAL | Status: AC
  Administered 2024-06-09: 650 mg via ORAL
  Filled 2024-06-09: qty 2

## 2024-06-09 MED ORDER — APREPITANT 130 MG/18ML IV EMUL
130.0000 mg | Freq: Once | INTRAVENOUS | Status: AC
  Administered 2024-06-09: 130 mg via INTRAVENOUS
  Filled 2024-06-09: qty 18

## 2024-06-09 NOTE — Patient Instructions (Signed)

## 2024-06-09 NOTE — Patient Instructions (Signed)
 [facility] - A DEPT OF . [facility]  Discharge Instructions: Thank you for choosing [facility] to provide your oncology and hematology care.   If you have a lab appointment with the Cancer Center, please go directly to the Cancer Center and check in at the registration area.   Wear comfortable clothing and clothing appropriate for easy access to any Portacath or PICC line.   We strive to give you quality time with your provider. You may need to reschedule your appointment if you arrive late (15 or more minutes).  Arriving late affects you and other patients whose appointments are after yours.  Also, if you miss three or more appointments without notifying the office, you may be dismissed from the clinic at the provider's discretion.      For prescription refill requests, have your pharmacy contact our office and allow 72 hours for refills to be completed.    Today you received the following chemotherapy and/or immunotherapy agents: doxorubicin, vincristine, cytoxan, rituximab      To help prevent nausea and vomiting after your treatment, we encourage you to take your nausea medication as directed.  BELOW ARE SYMPTOMS THAT SHOULD BE REPORTED IMMEDIATELY: *FEVER GREATER THAN 100.4 F (38 C) OR HIGHER *CHILLS OR SWEATING *NAUSEA AND VOMITING THAT IS NOT CONTROLLED WITH YOUR NAUSEA MEDICATION *UNUSUAL SHORTNESS OF BREATH *UNUSUAL BRUISING OR BLEEDING *URINARY PROBLEMS (pain or burning when urinating, or frequent urination) *BOWEL PROBLEMS (unusual diarrhea, constipation, pain near the anus) TENDERNESS IN MOUTH AND THROAT WITH OR WITHOUT PRESENCE OF ULCERS (sore throat, sores in mouth, or a toothache) UNUSUAL RASH, SWELLING OR PAIN  UNUSUAL VAGINAL DISCHARGE OR ITCHING   Items with * indicate a potential emergency and should be followed up as soon as possible or go to the Emergency Department if any problems should occur.  Please show the  CHEMOTHERAPY ALERT CARD or IMMUNOTHERAPY ALERT CARD at check-in to the Emergency Department and triage nurse.  Should you have questions after your visit or need to cancel or reschedule your appointment, please contact [facility] - A DEPT OF [facility]  Dept: [phone]  and follow the prompts.  Office hours are 8:00 a.m. to 4:30 p.m. Monday - Friday. Please note that voicemails left after 4:00 p.m. may not be returned until the following business day.  We are closed weekends and major holidays. You have access to a nurse at all times for urgent questions. Please call the main number to the clinic Dept: [phone] and follow the prompts.   For any non-urgent questions, you may also contact your provider using MyChart. We now offer e-Visits for anyone 31 and older to request care online for non-urgent symptoms. For details visit [email or web address].   Also download the MyChart app! Go to the app store, search MyChart, open the app, select [facility], and log in with your MyChart username and password.

## 2024-06-09 NOTE — Progress Notes (Signed)
 [facility] OFFICE PROGRESS NOTE   Diagnosis: Non-Hodgkin's lymphoma  INTERVAL HISTORY:   Lynn Hogan returns as scheduled.  She completed cycle 2 R-CHOP 05/19/2024.  She denies nausea/vomiting.  No mouth sores.  No diarrhea or constipation.  She takes MiraLAX  every day.  No numbness or tingling in the hands or feet.  No signs of allergic reaction.  She has a good appetite.  Mobility is better.  Stable back pain.  Objective:  Vital signs in last 24 hours:  Blood pressure (!) 151/69, pulse 73, temperature 97.8 F (36.6 C), temperature source Temporal, resp. rate 18, height 5' 1 (1.549 m), weight 172 lb 14.4 oz (78.4 kg), SpO2 98%.    HEENT: No thrush or ulcers. Resp: Lungs clear bilaterally. Cardio: Regular rate and rhythm. GI: No hepatosplenomegaly. Vascular: No leg edema. Neuro: Vibratory sense intact over the fingertips per tuning fork exam. Skin: No rash. Port-A-Cath without erythema.  Lab Results:  Lab Results  Component Value Date   WBC 6.7 06/09/2024   HGB 9.5 (L) 06/09/2024   HCT 29.6 (L) 06/09/2024   MCV 93.1 06/09/2024   PLT 652 (H) 06/09/2024   NEUTROABS 4.2 06/09/2024    Imaging:  No results found.  Medications: I have reviewed the patient's current medications.  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  Large B-cell lymphoma, stage I E, IPI-1, CNS IPI-1 03/21/2024 CT thoracic spine: Progression of lytic osseous metastatic disease at T9 with significant posterior extension into the spinal canal, mild progressive tumor at the  superior endplate of T10 03/21/2024 MRI thoracic and lumbar spine: Metastatic disease at T9 with extension to T10, extraosseous extension into the spinal canal and paraspinal soft tissues, moderate to severe canal stenosis at T9, small T8 metastasis 03/22/2024 CTs: Left apical pleural-based nodules favored scarring, no evidence of a primary malignancy or soft tissue metastasis, T9 and T10 metastases 03/24/2024 MRI brain: No evidence of metastatic disease, 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  Ki-67-60%, germinal center phenotype, MYC (8q24) not detected, MYC/IgH/CEN8t(8:14) not detected 04/18/2024 PET: Intense metabolic activity associated with the T9 lesion, no evidence of additional osseous involvement, no evidence of metastatic lymphadenopathy, normal spleen, new right pleural effusion-favored benign postsurgical Cycle 1 R-CHOP 04/28/2024 Cycle 2 R-CHOP 05/19/2024 Cycle 3 R-CHOP 06/09/2024   2.  Back pain secondary to a destructive mass at T9 3.  Hyperlipidemia 4.  History of coronary artery disease, NSTEMI 2020, STEMI 2020 5.  Hypertension 6.  History of CHF 7.  Basal cell carcinomas at the left ankle and right posterior arm excised 03/13/2022    Disposition: Lynn Hogan appears stable.  She has completed 2 cycles of R-CHOP.  She continues to tolerate treatment well.  Plan to proceed with cycle 3 today as scheduled.  Restaging PET scan in approximately 2 weeks.  She has been referred to radiation oncology.  CBC and chemistry panel reviewed.  Labs are adequate for treatment.  She will return for follow-up in 3 weeks.  We are available to see her sooner if needed.   [place]  Demitria Hay ANP/GNP-BC   06/09/2024  8:45 AM

## 2024-06-09 NOTE — Progress Notes (Addendum)
 Patient seen by Lonna Cobb NP today  Vitals are within treatment parameters:Yes  Increased 4 pounds  Labs are within treatment parameters: Yes   Treatment plan has been signed: Yes   Per physician team, Patient is ready for treatment and there are NO modifications to the treatment plan.

## 2024-06-10 ENCOUNTER — Other Ambulatory Visit: Payer: Self-pay

## 2024-06-11 ENCOUNTER — Inpatient Hospital Stay

## 2024-06-11 VITALS — BP 143/66 | HR 66 | Temp 97.2°F | Resp 20

## 2024-06-11 DIAGNOSIS — Z5111 Encounter for antineoplastic chemotherapy: Secondary | ICD-10-CM | POA: Diagnosis not present

## 2024-06-11 DIAGNOSIS — C8510 Unspecified B-cell lymphoma, unspecified site: Secondary | ICD-10-CM

## 2024-06-11 MED ORDER — PEGFILGRASTIM-CBQV 6 MG/0.6ML ~~LOC~~ SOSY
6.0000 mg | PREFILLED_SYRINGE | Freq: Once | SUBCUTANEOUS | Status: AC
  Administered 2024-06-11: 6 mg via SUBCUTANEOUS

## 2024-06-14 ENCOUNTER — Telehealth: Payer: Self-pay | Admitting: Oncology

## 2024-06-15 ENCOUNTER — Telehealth: Payer: Self-pay | Admitting: Oncology

## 2024-06-15 NOTE — Telephone Encounter (Signed)
 Returning PT phone call, unable to leave message

## 2024-06-18 ENCOUNTER — Other Ambulatory Visit: Payer: Self-pay

## 2024-06-24 ENCOUNTER — Encounter (HOSPITAL_COMMUNITY)
Admission: RE | Admit: 2024-06-24 | Discharge: 2024-06-24 | Disposition: A | Discharge status: Home / Self Care | Source: Ambulatory Visit | Attending: Nurse Practitioner | Admitting: Nurse Practitioner

## 2024-06-24 DIAGNOSIS — C8510 Unspecified B-cell lymphoma, unspecified site: Secondary | ICD-10-CM | POA: Insufficient documentation

## 2024-06-24 LAB — GLUCOSE, CAPILLARY: Glucose-Capillary: 103 mg/dL — ABNORMAL HIGH (ref 70–99)

## 2024-06-24 MED ORDER — FLUDEOXYGLUCOSE F - 18 (FDG) INJECTION
7.5000 | Freq: Once | INTRAVENOUS | Status: AC
  Administered 2024-06-24: 8.6 via INTRAVENOUS

## 2024-06-26 ENCOUNTER — Other Ambulatory Visit: Payer: Self-pay | Admitting: Oncology

## 2024-06-27 ENCOUNTER — Inpatient Hospital Stay

## 2024-06-27 ENCOUNTER — Telehealth: Payer: Self-pay | Admitting: Oncology

## 2024-06-27 ENCOUNTER — Inpatient Hospital Stay: Admitting: Oncology

## 2024-06-27 NOTE — Progress Notes (Incomplete)
 Lymphoma Location(s) / Histology:  Non Hodgkin's Lymphoma Osseus Metastatic Disease at T9 with extension inferiorly to involve T10  Rock JINNY Pepper presented  months ago with symptoms of:  [name], MD Back pain due to Metastatic Cancer.   Biopsies of  (if applicable) revealed:     Surgeries: [name] 03/25/2024 T9 and T10 Thoracic Laminectomy   06/24/2024 PET Scan   Past/Anticipated interventions by medical oncology, if any:  04/20/2024 [name], MD            Weight changes, if any, over the past 6 months: {:18581}  Recurrent fevers, or drenching night sweats, if any: {:18581}  SAFETY ISSUES: Prior radiation? {:18581} Pacemaker/ICD? {:18581} Possible current pregnancy? {:18581} Is the patient on methotrexate? {:18581}  Current Complaints / other details:  ***

## 2024-06-28 ENCOUNTER — Encounter: Payer: Self-pay | Admitting: *Deleted

## 2024-06-28 ENCOUNTER — Other Ambulatory Visit: Payer: Self-pay

## 2024-06-28 ENCOUNTER — Telehealth: Payer: Self-pay | Admitting: Oncology

## 2024-06-28 NOTE — Progress Notes (Signed)
 Per Dr. [name]: He needs to see Ms. Wiegert on 07/01/24 at 10:20. High priority scheduling message sent.

## 2024-06-28 NOTE — Telephone Encounter (Signed)
 Spoke with PT about scheduling upcoming FU appt, PT would like a telephone visit, sent message to nurse, will reach out to GBS.

## 2024-06-29 ENCOUNTER — Inpatient Hospital Stay

## 2024-07-01 ENCOUNTER — Telehealth: Payer: Self-pay | Admitting: Nurse Practitioner

## 2024-07-01 ENCOUNTER — Other Ambulatory Visit: Payer: Self-pay | Admitting: *Deleted

## 2024-07-01 ENCOUNTER — Inpatient Hospital Stay: Attending: Oncology | Admitting: Oncology

## 2024-07-01 DIAGNOSIS — C8510 Unspecified B-cell lymphoma, unspecified site: Secondary | ICD-10-CM

## 2024-07-01 NOTE — Progress Notes (Signed)
 " [facility]   HEMATOLOGY- ONCOLOGY TeleHEALTH OFFICE VISIT PROGRESS NOTE  I connected with Lynn Hogan on 07/01/2024 at 10:20 AM EST by phone and verified that I am speaking with the correct person using two identifiers.   I discussed the limitations, risks, security and privacy concerns of performing an evaluation and management service by telemedicine and the availability of in-person appointments. I also discussed with the patient that there may be a patient responsible charge related to this service. The patient expressed understanding and agreed to proceed.  Patient's location: Home Provider's location: Office   Diagnosis: Non-Hodgkin's lymphoma  INTERVAL HISTORY:  Lynn Hogan completed another cycle of R-CHOP on 06/09/2024.  She is seen today for a telehealth visit per her request.  She reports fatigue and abdominal discomfort following the most recent cycle of chemotherapy.  No nausea/vomiting, rash, or neuropathy symptoms.  She has stable back pain.  She is scheduled to see Dr. Joshua next week. She requested a telehealth visit due to lack of transportation.  Objective:   Lab Results:  Lab Results  Component Value Date   WBC 6.7 06/09/2024   HGB 9.5 (L) 06/09/2024   HCT 29.6 (L) 06/09/2024   MCV 93.1 06/09/2024   PLT 652 (H) 06/09/2024   NEUTROABS 4.2 06/09/2024    Imaging:  No results found.  Medications: I have reviewed the patient's current medications.  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  Large B-cell lymphoma, stage I E, IPI-1, CNS  IPI-1 03/21/2024 CT thoracic spine: Progression of lytic osseous metastatic disease at T9 with significant posterior extension into the spinal canal, mild progressive tumor at the superior endplate of T10 03/21/2024 MRI thoracic and lumbar spine: Metastatic disease at T9 with extension to T10, extraosseous extension into the spinal canal and paraspinal soft tissues, moderate to severe canal stenosis at T9, small T8 metastasis 03/22/2024 CTs: Left apical pleural-based nodules favored scarring, no evidence of a primary malignancy or soft tissue metastasis, T9 and T10 metastases 03/24/2024 MRI brain: No evidence of metastatic disease, 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  Ki-67-60%, germinal center phenotype, MYC (8q24) not detected, MYC/IgH/CEN8t(8:14) not detected 04/18/2024 PET: Intense metabolic activity associated with the T9 lesion, no evidence of additional osseous involvement, no evidence of metastatic lymphadenopathy, normal spleen, new right pleural effusion-favored benign postsurgical Cycle 1 R-CHOP 04/28/2024 Cycle 2 R-CHOP 05/19/2024 Cycle 3 R-CHOP 06/09/2024 06/24/2024 PET: Improved FDG activity at the T9 vertebra, due fall 3 consistent with a complete metabolic response,, no new sites of disease   2.  Back pain secondary to a destructive mass at T9 3.  Hyperlipidemia 4.  History of coronary artery disease, NSTEMI 2020, STEMI 2020 5.  Hypertension 6.  History of CHF 7.  Basal cell carcinomas at the left ankle and right posterior arm excised 03/13/2022     Disposition: Lynn Hogan has non-Hodgkin's lymphoma.  She has completed 3 cycles of R-CHOP.  She tolerated the treatment well.  The restaging PET is consistent with a complete response to treatment.  I reviewed the  PET findings with Lynn Hogan.  I reviewed the images.  We discussed treatment options including a  fourth cycle of R-CHOP versus referral for radiation consolidation.  We discussed the the expected similar long-term outcome of 4 cycles of R-CHOP and 3 cycles of R-CHOP plus radiation.  She is scheduled for consultation with Dr. Izell next week.  She will contact us  after this visit with her decision regarding another cycle of chemotherapy versus radiation consolidation.  Lynn Hogan will be scheduled for a follow-up visit 07/19/2023.  We will see her sooner if she decides to proceed with cycle 4 R-CHOP.   I discussed the assessment and treatment plan with the patient. The patient was provided an opportunity to ask questions and all were answered. The patient agreed with the plan and demonstrated an understanding of the instructions.   The patient was advised to call back or seek an in-person evaluation if the symptoms worsen or if the condition fails to improve as anticipated.  I provided 25 minutes of chart review, telephone, and documentation time during this encounter, and > 50% was spent counseling as documented under my assessment & plan.  Arley Hof MD 07/01/2024 10:34 AM        "

## 2024-07-01 NOTE — Progress Notes (Signed)
 Sent scheduling message to have Lynn Hogan have lab at [facility] on 1/07 while there for RT.

## 2024-07-01 NOTE — Telephone Encounter (Signed)
 Patient has been scheduled for follow-up visit per 07/01/2024 LOS.  LVM notifying pt of appt details, provided my direct number to pt if appt changes need to be made.

## 2024-07-03 ENCOUNTER — Other Ambulatory Visit: Payer: Self-pay

## 2024-07-04 ENCOUNTER — Ambulatory Visit

## 2024-07-04 ENCOUNTER — Ambulatory Visit: Admitting: Radiation Oncology

## 2024-07-05 NOTE — Progress Notes (Signed)
 Lymphoma Location(s) / Histology:  Non Hodgkin's Lymphoma Osseus Metastatic Disease at T9 with extension inferiorly to involve T10  Lynn Hogan Lynn Hogan presented  months ago with symptoms of:  [name], MD Back pain due to Metastatic Cancer.   Biopsies of  (if applicable) revealed:     Surgeries: [name] 03/25/2024 T9 and T10 Thoracic Laminectomy   06/24/2024 PET Scan   Past/Anticipated interventions by medical oncology, if any:  04/20/2024 [name], MD            Weight changes, if any, over the past 6 months: {:18581}  Recurrent fevers, or drenching night sweats, if any: {:18581}  SAFETY ISSUES: Prior radiation? {:18581} Pacemaker/ICD? {:18581} Possible current pregnancy? {:18581} Is the patient on methotrexate? {:18581}  Current Complaints / other details:  ***

## 2024-07-05 NOTE — Progress Notes (Signed)
 " Radiation Oncology         [phone] ________________________________  Initial outpatient Consultation  Name: Lynn Hogan MRN: [id number]  Date: 07/06/2024  DOB: May 07, 1946  RR:Gnwzd, Franky Sharper, PA-C  Sherrill, Gary B, MD   REFERRING PHYSICIAN: [name] Arley NOVAK, MD  DIAGNOSIS: No diagnosis found.   Cancer Staging  Large B-cell lymphoma (HCC) Staging form: Hodgkin and Non-Hodgkin Lymphoma, AJCC 8th Edition - Clinical: Stage IE - Signed by [name] Arley NOVAK, MD on 04/20/2024 International Prognostic Index (IPI) score: Score 1 International Prognostic Index risk group: Low risk  Malignant melanoma of left forearm (HCC) Staging form: Melanoma of the Skin, AJCC 8th Edition - Clinical: Stage IIC (cT4b, cN0, cM0) - Signed by [name] Arley NOVAK, MD on 03/22/2024   CHIEF COMPLAINT: Here to discuss management of metastatic cancer  HISTORY OF PRESENT ILLNESS::Lynn Hogan is a 79 y.o. female with a history of large B-cell lymphoma along with progressive lytic lesions diagnosed in 2024 s/p  decompressive thoracic laminectomy.   She underwent a PET scan on 04/18/24 showing intense metabolic activity associated with the T9 lesion, no evidence of additional osseous involvement, no evidence of metastatic lymphadenopathy, normal spleen. Scan also indicated a new right pleural effusion-favored benign postsurgical.  In light of findings, she then underwent chemotherapy and completed 3 cycles of R-CHOP.   Most recent PET scan performed on 06/24/24 showed mproved FDG activity at the T9 vertebra, due fall 3 consistent with a complete metabolic response,, no new sites of disease was indicated on scan.    During her most recent follow up with Sherrill on 07/02/23 where they discussed undergoing a fourth cycle of R-CHOP versus radiation.    PREVIOUS RADIATION THERAPY: No  PAST MEDICAL HISTORY:  has a past medical history of Coronary artery disease, Hyperlipidemia, NSTEMI (non-ST elevated  myocardial infarction) (HCC), STEMI (ST elevation myocardial infarction) (HCC), and Systolic heart failure (HCC).    PAST SURGICAL HISTORY: Past Surgical History:  Procedure Laterality Date   CARDIAC CATHETERIZATION     CATARACT EXTRACTION, BILATERAL  02/12/2016   CORONARY STENT INTERVENTION  08/02/2018   CORONARY STENT INTERVENTION N/A 08/02/2018   Procedure: CORONARY STENT INTERVENTION;  Surgeon: Mady Bruckner, MD;  Location: [facility];  Service: Cardiovascular;  Laterality: N/A;   CORONARY/GRAFT ACUTE MI REVASCULARIZATION N/A 08/08/2018   Procedure: Coronary/Graft Acute MI Revascularization;  Surgeon: Burnard Debby LABOR, MD;  Location: [facility];  Service: Cardiovascular;  Laterality: N/A;   HYSTERECTOMY ABDOMINAL WITH SALPINGECTOMY  02/12/1971   IR IMAGING GUIDED PORT INSERTION  04/25/2024   LEFT HEART CATH AND CORONARY ANGIOGRAPHY N/A 08/02/2018   Procedure: LEFT HEART CATH AND CORONARY ANGIOGRAPHY;  Surgeon: Mady Bruckner, MD;  Location: [facility];  Service: Cardiovascular;  Laterality: N/A;   LEFT HEART CATH AND CORONARY ANGIOGRAPHY N/A 08/08/2018   Procedure: LEFT HEART CATH AND CORONARY ANGIOGRAPHY;  Surgeon: Burnard Debby LABOR, MD;  Location: [facility];  Service: Cardiovascular;  Laterality: N/A;    FAMILY HISTORY: family history includes Brain cancer in her mother; Heart attack (age of onset: 9) in her father; Lung cancer in her mother; Microcephaly in her mother.  SOCIAL HISTORY:  reports that she has never smoked. She has never used smokeless tobacco. She reports that she does not drink alcohol and does not use drugs.  ALLERGIES: Ibuprofen and Nsaids  MEDICATIONS:  Current Outpatient Medications  Medication Sig Dispense Refill   acetaminophen  (TYLENOL ) 325 MG tablet Take 325  mg by mouth every 6 (six) hours as needed for mild pain (pain score 1-3).     aspirin  EC 81 MG EC tablet Take 1 tablet (81 mg total) by mouth daily. 90 tablet 3    atorvastatin  (LIPITOR ) 40 MG tablet Take 1 tablet (40 mg total) by mouth daily. 90 tablet 3   Cholecalciferol (VITAMIN D3) 10 MCG (400 UNIT) tablet Take 400 Units by mouth every evening.     diclofenac  Sodium (VOLTAREN ) 1 % GEL Apply 4 g topically 4 (four) times daily. (Patient not taking: Reported on 05/19/2024) 100 g 0   ezetimibe  (ZETIA ) 10 MG tablet TAKE 1 TABLET EVERY DAY (NEED MD APPOINTMENT FOR REFILLS) 15 tablet 0   lidocaine -prilocaine  (EMLA ) cream Apply 1 Application topically as needed (Apply 1-2 hours prior to port use). 30 g 2   metoprolol  succinate (TOPROL -XL) 25 MG 24 hr tablet Take 1 tablet (25 mg total) by mouth daily. TAKE 1 TABLET EVERY DAY WITH OR IMMEDIATELY FOLLOWING A MEAL. Please call [phone] to schedule an overdue appointment for future refills. Thank you.     nitroGLYCERIN  (NITROSTAT ) 0.4 MG SL tablet Place 1 tablet (0.4 mg total) under the tongue every 5 (five) minutes as needed for chest pain. (Patient not taking: Reported on 05/19/2024) 25 tablet 3   ondansetron  (ZOFRAN ) 8 MG tablet Take 1 tablet (8 mg total) by mouth every 8 (eight) hours as needed (Start 3 days after chemo as needed for nausea). 20 tablet 2   ondansetron  (ZOFRAN -ODT) 4 MG disintegrating tablet Take 1 tablet (4 mg total) by mouth every 8 (eight) hours as needed for nausea or vomiting. (Patient not taking: Reported on 05/19/2024) 30 tablet 0   predniSONE  (DELTASONE ) 20 MG tablet Take 4 tablets (80 mg total) by mouth daily with breakfast. Starting day after each chemo cycle for 4 days (Patient not taking: Reported on 05/19/2024) 20 tablet 3   prochlorperazine  (COMPAZINE ) 10 MG tablet Take 1 tablet (10 mg total) by mouth every 6 (six) hours as needed for nausea or vomiting. (Patient not taking: Reported on 05/19/2024) 30 tablet 2   sulfamethoxazole -trimethoprim  (BACTRIM  DS) 800-160 MG tablet Take 1 tablet by mouth 2 (two) times daily. Take 1 tablet twice daily x 3 days (Patient not taking: Reported on  05/19/2024) 6 tablet 0   No current facility-administered medications for this visit.    REVIEW OF SYSTEMS:  Notable for that above.   PHYSICAL EXAM:  vitals were not taken for this visit.   General: Alert and oriented, in no acute distress *** HEENT: Head is normocephalic. Extraocular movements are intact. Oropharynx is clear. Neck: Neck is supple, no palpable cervical or supraclavicular lymphadenopathy. Heart: Regular in rate and rhythm with no murmurs, rubs, or gallops. Chest: Clear to auscultation bilaterally, with no rhonchi, wheezes, or rales. Abdomen: Soft, nontender, nondistended, with no rigidity or guarding. Extremities: No cyanosis or edema. Lymphatics: see Neck Exam Skin: No concerning lesions. Musculoskeletal: symmetric strength and muscle tone throughout. Neurologic: Cranial nerves II through XII are grossly intact. No obvious focalities. Speech is fluent. Coordination is intact. Psychiatric: Judgment and insight are intact. Affect is appropriate.   ECOG = ***  0 - Asymptomatic (Fully active, able to carry on all predisease activities without restriction)  1 - Symptomatic but completely ambulatory (Restricted in physically strenuous activity but ambulatory and able to carry out work of a light or sedentary nature. For example, light housework, office work)  2 - Symptomatic, <50% in bed during the day (  Ambulatory and capable of all self care but unable to carry out any work activities. Up and about more than 50% of waking hours)  3 - Symptomatic, >50% in bed, but not bedbound (Capable of only limited self-care, confined to bed or chair 50% or more of waking hours)  4 - Bedbound (Completely disabled. Cannot carry on any self-care. Totally confined to bed or chair)  5 - Death   Raylene MM, Creech RH, Tormey DC, et al. [phone]). Toxicity and response criteria of the [facility]. Am. DOROTHA Bridges. Oncol. 5 (6): 649-55   LABORATORY DATA:  Lab Results   Component Value Date   WBC 6.7 06/09/2024   HGB 9.5 (L) 06/09/2024   HCT 29.6 (L) 06/09/2024   MCV 93.1 06/09/2024   PLT 652 (H) 06/09/2024   CMP     Component Value Date/Time   NA 140 06/09/2024 0812   NA 146 (H) 07/25/2021 0919   K 4.0 06/09/2024 0812   CL 104 06/09/2024 0812   CO2 25 06/09/2024 0812   GLUCOSE 114 (H) 06/09/2024 0812   BUN 16 06/09/2024 0812   BUN 11 07/25/2021 0919   CREATININE 0.51 06/09/2024 0812   CALCIUM  10.1 06/09/2024 0812   PROT 7.0 06/09/2024 0812   PROT 6.8 07/25/2021 0919   ALBUMIN 3.9 06/09/2024 0812   ALBUMIN 4.4 07/25/2021 0919   AST 19 06/09/2024 0812   ALT 11 06/09/2024 0812   ALKPHOS 104 06/09/2024 0812   BILITOT 0.2 06/09/2024 0812   EGFR 80 07/25/2021 0919   GFRNONAA >60 06/09/2024 0812         RADIOGRAPHY: NM PET Image Restag (PS) Skull Base To Thigh Result Date: 06/27/2024 EXAM: PET AND CT SKULL BASE TO MID THIGH 06/24/2024 11:23:21 AM TECHNIQUE: RADIOPHARMACEUTICAL: 8.6 mCi F-18 FDG Uptake time 60 minutes. Glucose level 103 mg/dl. PET imaging was acquired from the base of the skull to the mid thighs. Non-contrast enhanced computed tomography was obtained for attenuation correction and anatomic localization. COMPARISON: 04/18/2024 CLINICAL HISTORY: restage NHL FINDINGS: HEAD AND NECK: There are no tracer-avid lymph nodes within the soft tissues of the neck. CHEST: There are no tracer-avid mediastinal, hilar, supraclavicular, or axillary lymph nodes. No tracer-avid pulmonary nodule or mass is seen. Mild scarring is noted in the posterior right base and right middle lobe. Aortic atherosclerotic calcifications and coronary artery calcifications are present. ABDOMEN AND PELVIS: No metabolically active intraperitoneal mass. No metabolically active lymphadenopathy. Physiologic activity within the gastrointestinal and genitourinary systems. No abnormal increased tracer uptake is observed within the liver, pancreas, spleen, and adrenal glands. SUV  max in the liver measures 3.9. Signs of pelvic floor laxity are noted. BONES AND SOFT TISSUE: Postoperative changes are identified within the lower thoracic spine, compatible with posterior decompression laminectomy and pedicle screw and posterior rod fixation above and below the T9 vertebra. The SUV max for FDG uptake noted on the previous exam within the T9 vertebra has improved, with the current SUV max in this area at 3.9 compared to 22.3 previously. No metabolically active aggressive osseous lesion. No new sites of disease are identified. SUV max in the blood pool measures 2.6. IMPRESSION: 1. Deauville score 3 complete metabolic response, with no FDG-avid lymphadenopathy or new sites of disease and markedly improved uptake in the T9 vertebra. Electronically signed by: Waddell Calk MD 06/27/2024 08:27 AM EST RP Workstation: [id number]      IMPRESSION/PLAN:***    On date of service, in total, I spent *** minutes on  this encounter. Patient was seen in person.   __________________________________________   Lauraine Golden, MD  This document serves as a record of services personally performed by Lauraine Golden, MD. It was created on her behalf by Reymundo Cartwright, a trained medical scribe. The creation of this record is based on the scribe's personal observations and the provider's statements to them. This document has been checked and approved by the attending provider.  "

## 2024-07-06 ENCOUNTER — Encounter: Payer: Self-pay | Admitting: Radiation Oncology

## 2024-07-06 ENCOUNTER — Ambulatory Visit
Admission: RE | Admit: 2024-07-06 | Discharge: 2024-07-06 | Disposition: A | Discharge status: Home / Self Care | Source: Ambulatory Visit | Attending: Radiation Oncology | Admitting: Radiation Oncology

## 2024-07-06 ENCOUNTER — Inpatient Hospital Stay

## 2024-07-06 VITALS — BP 148/66 | HR 62 | Temp 99.1°F | Resp 18 | Wt 169.0 lb

## 2024-07-06 DIAGNOSIS — Z801 Family history of malignant neoplasm of trachea, bronchus and lung: Secondary | ICD-10-CM | POA: Insufficient documentation

## 2024-07-06 DIAGNOSIS — J90 Pleural effusion, not elsewhere classified: Secondary | ICD-10-CM | POA: Insufficient documentation

## 2024-07-06 DIAGNOSIS — E785 Hyperlipidemia, unspecified: Secondary | ICD-10-CM | POA: Insufficient documentation

## 2024-07-06 DIAGNOSIS — Z7982 Long term (current) use of aspirin: Secondary | ICD-10-CM | POA: Insufficient documentation

## 2024-07-06 DIAGNOSIS — Z7952 Long term (current) use of systemic steroids: Secondary | ICD-10-CM | POA: Insufficient documentation

## 2024-07-06 DIAGNOSIS — C7951 Secondary malignant neoplasm of bone: Secondary | ICD-10-CM

## 2024-07-06 DIAGNOSIS — Z923 Personal history of irradiation: Secondary | ICD-10-CM | POA: Insufficient documentation

## 2024-07-06 DIAGNOSIS — Z79899 Other long term (current) drug therapy: Secondary | ICD-10-CM | POA: Insufficient documentation

## 2024-07-06 DIAGNOSIS — C4362 Malignant melanoma of left upper limb, including shoulder: Secondary | ICD-10-CM | POA: Insufficient documentation

## 2024-07-06 DIAGNOSIS — I252 Old myocardial infarction: Secondary | ICD-10-CM | POA: Insufficient documentation

## 2024-07-06 DIAGNOSIS — Z9221 Personal history of antineoplastic chemotherapy: Secondary | ICD-10-CM | POA: Insufficient documentation

## 2024-07-06 DIAGNOSIS — R5383 Other fatigue: Secondary | ICD-10-CM | POA: Insufficient documentation

## 2024-07-06 DIAGNOSIS — C8510 Unspecified B-cell lymphoma, unspecified site: Secondary | ICD-10-CM

## 2024-07-06 DIAGNOSIS — R12 Heartburn: Secondary | ICD-10-CM | POA: Insufficient documentation

## 2024-07-06 DIAGNOSIS — M7989 Other specified soft tissue disorders: Secondary | ICD-10-CM | POA: Insufficient documentation

## 2024-07-06 DIAGNOSIS — Z800 Family history of malignant neoplasm of digestive organs: Secondary | ICD-10-CM | POA: Insufficient documentation

## 2024-07-06 DIAGNOSIS — I2510 Atherosclerotic heart disease of native coronary artery without angina pectoris: Secondary | ICD-10-CM | POA: Insufficient documentation

## 2024-07-06 HISTORY — DX: Malignant neoplasm of bone and articular cartilage, unspecified: C41.9

## 2024-07-06 LAB — CBC WITH DIFFERENTIAL (CANCER CENTER ONLY)
Abs Immature Granulocytes: 0.04 K/uL (ref 0.00–0.07)
Basophils Absolute: 0.1 K/uL (ref 0.0–0.1)
Basophils Relative: 1 %
Eosinophils Absolute: 0 K/uL (ref 0.0–0.5)
Eosinophils Relative: 0 %
HCT: 34.6 % — ABNORMAL LOW (ref 36.0–46.0)
Hemoglobin: 11.1 g/dL — ABNORMAL LOW (ref 12.0–15.0)
Immature Granulocytes: 1 %
Lymphocytes Relative: 13 %
Lymphs Abs: 1.1 K/uL (ref 0.7–4.0)
MCH: 30.5 pg (ref 26.0–34.0)
MCHC: 32.1 g/dL (ref 30.0–36.0)
MCV: 95.1 fL (ref 80.0–100.0)
Monocytes Absolute: 0.8 K/uL (ref 0.1–1.0)
Monocytes Relative: 9 %
Neutro Abs: 6.8 K/uL (ref 1.7–7.7)
Neutrophils Relative %: 76 %
Platelet Count: 356 K/uL (ref 150–400)
RBC: 3.64 MIL/uL — ABNORMAL LOW (ref 3.87–5.11)
RDW: 17.9 % — ABNORMAL HIGH (ref 11.5–15.5)
WBC Count: 8.8 K/uL (ref 4.0–10.5)
nRBC: 0 % (ref 0.0–0.2)

## 2024-07-06 LAB — CMP (CANCER CENTER ONLY)
ALT: 11 U/L (ref 0–44)
AST: 23 U/L (ref 15–41)
Albumin: 4.2 g/dL (ref 3.5–5.0)
Alkaline Phosphatase: 91 U/L (ref 38–126)
Anion gap: 10 (ref 5–15)
BUN: 16 mg/dL (ref 8–23)
CO2: 26 mmol/L (ref 22–32)
Calcium: 9.7 mg/dL (ref 8.9–10.3)
Chloride: 103 mmol/L (ref 98–111)
Creatinine: 0.68 mg/dL (ref 0.44–1.00)
GFR, Estimated: >60 mL/min
Glucose, Bld: 104 mg/dL — ABNORMAL HIGH (ref 70–99)
Potassium: 4.1 mmol/L (ref 3.5–5.1)
Sodium: 139 mmol/L (ref 135–145)
Total Bilirubin: 0.2 mg/dL (ref 0.0–1.2)
Total Protein: 7.1 g/dL (ref 6.5–8.1)

## 2024-07-06 LAB — LACTATE DEHYDROGENASE: LDH: 144 U/L (ref 105–235)

## 2024-07-06 NOTE — Progress Notes (Signed)
 [facility] Clinical Social Work  Clinical Social Work was referred by engineer, civil (consulting) for transportation barriers.  Clinical Social Worker reviewed chart for eligibility of services. Patient does not have insurance with transportation benefits. Referral sent to transportation coordinator. Referral will be closed.   Lizbeth Sprague, LCSW  Clinical Social Worker [facility]

## 2024-07-06 NOTE — Addendum Note (Signed)
 Encounter addended by: Claudene Odella SAILOR, RN on: 07/06/2024 12:33 PM  Actions taken: Order list changed, Diagnosis association updated

## 2024-07-07 ENCOUNTER — Encounter: Payer: Self-pay | Admitting: *Deleted

## 2024-07-07 ENCOUNTER — Telehealth: Payer: Self-pay | Admitting: Oncology

## 2024-07-07 ENCOUNTER — Other Ambulatory Visit: Payer: Self-pay

## 2024-07-07 NOTE — Progress Notes (Signed)
 Per Dr. Izell, patient is going to proceed with radiation. Last treatment on 08/12/24. Per Dr. [name], schedule a few weeks after RT. Scheduling message sent to see MD in early March.

## 2024-07-07 NOTE — Telephone Encounter (Signed)
 Left detailed message regarding reschedule appointment details on PT voicemail.

## 2024-07-08 ENCOUNTER — Other Ambulatory Visit: Payer: Self-pay

## 2024-07-13 ENCOUNTER — Ambulatory Visit: Admitting: Radiation Oncology

## 2024-07-15 ENCOUNTER — Ambulatory Visit
Admission: RE | Admit: 2024-07-15 | Discharge: 2024-07-15 | Disposition: A | Discharge status: Home / Self Care | Source: Ambulatory Visit | Attending: Radiation Oncology | Admitting: Radiation Oncology

## 2024-07-15 DIAGNOSIS — Z510 Encounter for antineoplastic radiation therapy: Secondary | ICD-10-CM | POA: Diagnosis present

## 2024-07-15 DIAGNOSIS — C7951 Secondary malignant neoplasm of bone: Secondary | ICD-10-CM | POA: Insufficient documentation

## 2024-07-15 DIAGNOSIS — C8510 Unspecified B-cell lymphoma, unspecified site: Secondary | ICD-10-CM | POA: Insufficient documentation

## 2024-07-17 ENCOUNTER — Other Ambulatory Visit: Payer: Self-pay | Admitting: Oncology

## 2024-07-19 ENCOUNTER — Inpatient Hospital Stay: Admitting: Nurse Practitioner

## 2024-07-19 ENCOUNTER — Other Ambulatory Visit: Payer: Self-pay

## 2024-07-19 ENCOUNTER — Emergency Department (HOSPITAL_BASED_OUTPATIENT_CLINIC_OR_DEPARTMENT_OTHER)
Admission: EM | Admit: 2024-07-19 | Discharge: 2024-07-19 | Disposition: A | Discharge status: Home / Self Care | Attending: Emergency Medicine | Admitting: Emergency Medicine

## 2024-07-19 DIAGNOSIS — R002 Palpitations: Secondary | ICD-10-CM | POA: Diagnosis present

## 2024-07-19 DIAGNOSIS — I471 Supraventricular tachycardia, unspecified: Secondary | ICD-10-CM | POA: Insufficient documentation

## 2024-07-19 DIAGNOSIS — Z7982 Long term (current) use of aspirin: Secondary | ICD-10-CM | POA: Insufficient documentation

## 2024-07-19 LAB — URINALYSIS, W/ REFLEX TO CULTURE (INFECTION SUSPECTED)
Bacteria, UA: NONE SEEN
Bilirubin Urine: NEGATIVE
Glucose, UA: NEGATIVE mg/dL
Hgb urine dipstick: NEGATIVE
Ketones, ur: NEGATIVE mg/dL
Nitrite: NEGATIVE
Protein, ur: NEGATIVE mg/dL
Specific Gravity, Urine: 1.005 (ref 1.005–1.030)
pH: 7.5 (ref 5.0–8.0)

## 2024-07-19 LAB — BASIC METABOLIC PANEL WITH GFR
Anion gap: 17 — ABNORMAL HIGH (ref 5–15)
BUN: 15 mg/dL (ref 8–23)
CO2: 21 mmol/L — ABNORMAL LOW (ref 22–32)
Calcium: 10.4 mg/dL — ABNORMAL HIGH (ref 8.9–10.3)
Chloride: 102 mmol/L (ref 98–111)
Creatinine, Ser: 0.76 mg/dL (ref 0.44–1.00)
GFR, Estimated: >60 mL/min
Glucose, Bld: 136 mg/dL — ABNORMAL HIGH (ref 70–99)
Potassium: 4 mmol/L (ref 3.5–5.1)
Sodium: 140 mmol/L (ref 135–145)

## 2024-07-19 LAB — CBC WITH DIFFERENTIAL/PLATELET
Abs Immature Granulocytes: 0.04 K/uL (ref 0.00–0.07)
Basophils Absolute: 0.1 K/uL (ref 0.0–0.1)
Basophils Relative: 1 %
Eosinophils Absolute: 0.2 K/uL (ref 0.0–0.5)
Eosinophils Relative: 2 %
HCT: 38.6 % (ref 36.0–46.0)
Hemoglobin: 12.4 g/dL (ref 12.0–15.0)
Immature Granulocytes: 0 %
Lymphocytes Relative: 15 %
Lymphs Abs: 1.4 K/uL (ref 0.7–4.0)
MCH: 31.3 pg (ref 26.0–34.0)
MCHC: 32.1 g/dL (ref 30.0–36.0)
MCV: 97.5 fL (ref 80.0–100.0)
Monocytes Absolute: 0.8 K/uL (ref 0.1–1.0)
Monocytes Relative: 8 %
Neutro Abs: 7.2 K/uL (ref 1.7–7.7)
Neutrophils Relative %: 74 %
Platelets: 373 K/uL (ref 150–400)
RBC: 3.96 MIL/uL (ref 3.87–5.11)
RDW: 17 % — ABNORMAL HIGH (ref 11.5–15.5)
WBC: 9.6 K/uL (ref 4.0–10.5)
nRBC: 0 % (ref 0.0–0.2)

## 2024-07-19 LAB — TSH: TSH: 4.37 u[IU]/mL (ref 0.350–4.500)

## 2024-07-19 MED ORDER — LACTATED RINGERS IV SOLN: INTRAVENOUS | Status: DC

## 2024-07-19 MED ORDER — METOPROLOL TARTRATE 5 MG/5ML IV SOLN
5.0000 mg | Freq: Once | INTRAVENOUS | Status: AC
  Administered 2024-07-19: 5 mg via INTRAVENOUS

## 2024-07-19 NOTE — ED Provider Notes (Signed)
 " [place] EMERGENCY DEPARTMENT AT [facility] Provider Note   CSN: [id number] Arrival date & time: 07/19/24  9044     Patient presents with: Palpitations   Lynn Hogan is a 79 y.o. female.   79 year old female with history of SVT presents with increased heart rate this morning.  Patient states when she awoke she felt heart beating quickly.  Took her normal daily dose of metoprolol  50 mg.  States that she was not short of breath.  Did not have any chest pain.  No recent cough congestion.  No fever or chills.  No recent medication changes.  No caffeine use.  Waited a couple hours and the symptoms did not improve and she presents here at this time.  States that she has had SVT before in the past but does not recall ever getting adenosine       Prior to Admission medications  Medication Sig Start Date End Date Taking? Authorizing Provider  acetaminophen  (TYLENOL ) 325 MG tablet Take 325 mg by mouth every 6 (six) hours as needed for mild pain (pain score 1-3).    [provider]  aspirin  EC 81 MG EC tablet Take 1 tablet (81 mg total) by mouth daily. 08/03/18   Bhagat, Aleene, PA  atorvastatin  (LIPITOR ) 40 MG tablet Take 1 tablet (40 mg total) by mouth daily. 08/09/21   Burnard Debby LABOR, MD  Cholecalciferol (VITAMIN D3) 10 MCG (400 UNIT) tablet Take 400 Units by mouth every evening.    [provider]  ezetimibe  (ZETIA ) 10 MG tablet TAKE 1 TABLET EVERY DAY (NEED MD APPOINTMENT FOR REFILLS) 12/25/23   Burnard Debby LABOR, MD  lidocaine -prilocaine  (EMLA ) cream Apply 1 Application topically as needed (Apply 1-2 hours prior to port use). 04/22/24   [name] Arley NOVAK, MD  metoprolol  succinate (TOPROL -XL) 25 MG 24 hr tablet Take 1 tablet (25 mg total) by mouth daily. TAKE 1 TABLET EVERY DAY WITH OR IMMEDIATELY FOLLOWING A MEAL. Please call [phone] to schedule an overdue appointment for future refills. Thank you. 04/09/24   Patsy Lenis, MD  metoprolol  succinate  (TOPROL -XL) 50 MG 24 hr tablet Take 50 mg by mouth daily.    [provider]  ondansetron  (ZOFRAN ) 8 MG tablet Take 1 tablet (8 mg total) by mouth every 8 (eight) hours as needed (Start 3 days after chemo as needed for nausea). 04/22/24   [name] Arley NOVAK, MD  ondansetron  (ZOFRAN -ODT) 4 MG disintegrating tablet Take 1 tablet (4 mg total) by mouth every 8 (eight) hours as needed for nausea or vomiting. Patient not taking: Reported on 05/19/2024 04/09/24   Patsy Lenis, MD  predniSONE  (DELTASONE ) 20 MG tablet Take 4 tablets (80 mg total) by mouth daily with breakfast. Starting day after each chemo cycle for 4 days Patient not taking: Reported on 05/19/2024 05/09/24   Debby Olam POUR, NP  prochlorperazine  (COMPAZINE ) 10 MG tablet Take 1 tablet (10 mg total) by mouth every 6 (six) hours as needed for nausea or vomiting. Patient not taking: Reported on 05/19/2024 04/22/24   [name] Arley NOVAK, MD    Allergies: Ibuprofen and Nsaids    Review of Systems  All other systems reviewed and are negative.   Updated Vital Signs BP (!) 152/98   Pulse (!) 160   Temp 97.9 F (36.6 C)   Resp 17   SpO2 92%   Physical Exam Vitals and nursing note reviewed.  Constitutional:      General: She is not in acute distress.  Appearance: Normal appearance. She is well-developed. She is not toxic-appearing.  HENT:     Head: Normocephalic and atraumatic.  Eyes:     General: Lids are normal.     Conjunctiva/sclera: Conjunctivae normal.     Pupils: Pupils are equal, round, and reactive to light.  Neck:     Thyroid : No thyroid  mass.     Trachea: No tracheal deviation.  Cardiovascular:     Rate and Rhythm: Regular rhythm. Tachycardia present.     Heart sounds: Normal heart sounds. No murmur heard.    No gallop.  Pulmonary:     Effort: Pulmonary effort is normal. No respiratory distress.     Breath sounds: Normal breath sounds. No stridor. No decreased breath sounds, wheezing, rhonchi or rales.   Abdominal:     General: There is no distension.     Palpations: Abdomen is soft.     Tenderness: There is no abdominal tenderness. There is no rebound.  Musculoskeletal:        General: No tenderness. Normal range of motion.     Cervical back: Normal range of motion and neck supple.  Skin:    General: Skin is warm and dry.     Findings: No abrasion or rash.  Neurological:     Mental Status: She is alert and oriented to person, place, and time. Mental status is at baseline.     GCS: GCS eye subscore is 4. GCS verbal subscore is 5. GCS motor subscore is 6.     Cranial Nerves: No cranial nerve deficit.     Sensory: No sensory deficit.     Motor: Motor function is intact.  Psychiatric:        Attention and Perception: Attention normal.        Speech: Speech normal.        Behavior: Behavior normal.     (all labs ordered are listed, but only abnormal results are displayed) Labs Reviewed  CBC WITH DIFFERENTIAL/PLATELET  BASIC METABOLIC PANEL WITH GFR  URINALYSIS, W/ REFLEX TO CULTURE (INFECTION SUSPECTED)    EKG: EKG Interpretation Date/Time:  Tuesday July 19 2024 10:03:05 EST Ventricular Rate:  178 PR Interval:    QRS Duration:  66 QT Interval:  246 QTC Calculation: 423 R Axis:   -4  Text Interpretation: Supraventricular tachycardia Cannot rule out Anterior infarct , age undetermined Marked ST abnormality, possible inferior subendocardial injury Abnormal ECG When compared with ECG of 22-Mar-2024 21:39, Vent. rate has increased BY 107 BPM QRS duration has decreased Borderline criteria for Inferior infarct are no longer Present ST depression has replaced ST elevation in Inferior leads ST depression has replaced ST elevation in Lateral leads Confirmed by Dasie Faden ([id number]) on 07/19/2024 10:23:39 AM  Radiology: No results found.   Procedures   Medications Ordered in the ED  lactated ringers  infusion (has no administration in time range)  metoprolol  tartrate  (LOPRESSOR ) injection 5 mg (has no administration in time range)                                    Medical Decision Making Amount and/or Complexity of Data Reviewed Labs: ordered.  Risk Prescription drug management.   Patient is EKG showed SVT.  She was given metoprolol  5 mg with conversion back to sinus rhythm which was confirmed by repeat EKG.  Patient labs otherwise reassuring.  Was monitored here.  Family requested that thyroid  studies be  ordered and that was sent.  I do not feel that this represents ACS that patient only had tachycardia.  Did discuss the reason for not getting a troponin given that her symptoms according to her were not like her prior anginal sx. patient to call her doctor today to schedule follow-up for adjustment of her metoprolol .  Return precautions given  CRITICAL CARE Performed by: Curtistine ONEIDA Dawn Total critical care time: 60 minutes Critical care time was exclusive of separately billable procedures and treating other patients. Critical care was necessary to treat or prevent imminent or life-threatening deterioration. Critical care was time spent personally by me on the following activities: development of treatment plan with patient and/or surrogate as well as nursing, discussions with consultants, evaluation of patient's response to treatment, examination of patient, obtaining history from patient or surrogate, ordering and performing treatments and interventions, ordering and review of laboratory studies, ordering and review of radiographic studies, pulse oximetry and re-evaluation of patient's condition.      Final diagnoses:  None    ED Discharge Orders     None          Dawn Curtistine, MD 07/19/24 1258  "

## 2024-07-19 NOTE — ED Triage Notes (Signed)
 Pt caox4 c/o SOB, palpitations, dizziness, nausea since approx 0700. Pt states this has happened in the past and has been relieved after taking metoprolol  but that it hasn't helped this time. SVT in triage.

## 2024-07-19 NOTE — Discharge Instructions (Signed)
 Thyroid  function labs were added and please have your doctor follow-up on these.  Follow-up with your doctor also for medication adjustment for your metoprolol .

## 2024-07-20 DIAGNOSIS — Z510 Encounter for antineoplastic radiation therapy: Secondary | ICD-10-CM | POA: Diagnosis not present

## 2024-07-20 LAB — T4: T4, Total: 7.8 ug/dL (ref 4.5–12.0)

## 2024-07-25 ENCOUNTER — Ambulatory Visit

## 2024-07-25 ENCOUNTER — Ambulatory Visit: Admitting: Radiation Oncology

## 2024-07-26 ENCOUNTER — Ambulatory Visit

## 2024-07-26 ENCOUNTER — Ambulatory Visit: Admitting: Radiation Oncology

## 2024-07-27 ENCOUNTER — Ambulatory Visit

## 2024-07-28 ENCOUNTER — Ambulatory Visit

## 2024-07-29 ENCOUNTER — Ambulatory Visit

## 2024-08-01 ENCOUNTER — Ambulatory Visit: Admitting: Radiation Oncology

## 2024-08-01 ENCOUNTER — Ambulatory Visit

## 2024-08-02 ENCOUNTER — Other Ambulatory Visit: Payer: Self-pay

## 2024-08-02 ENCOUNTER — Ambulatory Visit

## 2024-08-02 ENCOUNTER — Ambulatory Visit
Admission: RE | Admit: 2024-08-02 | Discharge: 2024-08-02 | Disposition: A | Source: Ambulatory Visit | Attending: Radiation Oncology

## 2024-08-02 ENCOUNTER — Ambulatory Visit
Admission: RE | Admit: 2024-08-02 | Discharge: 2024-08-02 | Attending: Radiation Oncology | Admitting: Radiation Oncology

## 2024-08-02 LAB — RAD ONC ARIA SESSION SUMMARY
Course Elapsed Days: 0
Plan Fractions Treated to Date: 1
Plan Prescribed Dose Per Fraction: 2 Gy
Plan Total Fractions Prescribed: 15
Plan Total Prescribed Dose: 30 Gy
Reference Point Dosage Given to Date: 2 Gy
Reference Point Session Dosage Given: 2 Gy
Session Number: 1

## 2024-08-03 ENCOUNTER — Ambulatory Visit
Admission: RE | Admit: 2024-08-03 | Discharge: 2024-08-03 | Disposition: A | Source: Ambulatory Visit | Attending: Radiation Oncology

## 2024-08-03 ENCOUNTER — Other Ambulatory Visit: Payer: Self-pay

## 2024-08-03 ENCOUNTER — Ambulatory Visit

## 2024-08-03 LAB — RAD ONC ARIA SESSION SUMMARY
Course Elapsed Days: 1
Plan Fractions Treated to Date: 2
Plan Prescribed Dose Per Fraction: 2 Gy
Plan Total Fractions Prescribed: 15
Plan Total Prescribed Dose: 30 Gy
Reference Point Dosage Given to Date: 4 Gy
Reference Point Session Dosage Given: 2 Gy
Session Number: 2

## 2024-08-04 ENCOUNTER — Ambulatory Visit

## 2024-08-04 ENCOUNTER — Other Ambulatory Visit: Payer: Self-pay

## 2024-08-04 ENCOUNTER — Ambulatory Visit
Admission: RE | Admit: 2024-08-04 | Discharge: 2024-08-04 | Disposition: A | Source: Ambulatory Visit | Attending: Radiation Oncology

## 2024-08-04 LAB — RAD ONC ARIA SESSION SUMMARY
Course Elapsed Days: 2
Plan Fractions Treated to Date: 3
Plan Prescribed Dose Per Fraction: 2 Gy
Plan Total Fractions Prescribed: 15
Plan Total Prescribed Dose: 30 Gy
Reference Point Dosage Given to Date: 6 Gy
Reference Point Session Dosage Given: 2 Gy
Session Number: 3

## 2024-08-05 ENCOUNTER — Other Ambulatory Visit: Payer: Self-pay

## 2024-08-05 ENCOUNTER — Ambulatory Visit: Admission: RE | Admit: 2024-08-05 | Source: Ambulatory Visit

## 2024-08-05 ENCOUNTER — Ambulatory Visit

## 2024-08-05 LAB — RAD ONC ARIA SESSION SUMMARY
Course Elapsed Days: 3
Plan Fractions Treated to Date: 4
Plan Prescribed Dose Per Fraction: 2 Gy
Plan Total Fractions Prescribed: 15
Plan Total Prescribed Dose: 30 Gy
Reference Point Dosage Given to Date: 8 Gy
Reference Point Session Dosage Given: 2 Gy
Session Number: 4

## 2024-08-08 ENCOUNTER — Ambulatory Visit

## 2024-08-09 ENCOUNTER — Ambulatory Visit

## 2024-08-10 ENCOUNTER — Ambulatory Visit

## 2024-08-11 ENCOUNTER — Ambulatory Visit

## 2024-08-12 ENCOUNTER — Ambulatory Visit

## 2024-08-15 ENCOUNTER — Ambulatory Visit

## 2024-08-15 ENCOUNTER — Ambulatory Visit: Admitting: Radiation Oncology

## 2024-08-16 ENCOUNTER — Ambulatory Visit: Admitting: Radiation Oncology

## 2024-08-16 ENCOUNTER — Ambulatory Visit

## 2024-08-17 ENCOUNTER — Ambulatory Visit

## 2024-08-18 ENCOUNTER — Ambulatory Visit

## 2024-08-19 ENCOUNTER — Ambulatory Visit: Admitting: Radiation Oncology

## 2024-08-22 ENCOUNTER — Ambulatory Visit

## 2024-08-31 ENCOUNTER — Inpatient Hospital Stay: Admitting: Oncology
